# Patient Record
Sex: Female | Born: 1950 | Race: Black or African American | Hispanic: No | Marital: Married | Smoking: Current every day smoker
Health system: Southern US, Community
[De-identification: ages and names within clinical notes are randomized; demographics above are authoritative.]

## PROBLEM LIST (undated history)

## (undated) DIAGNOSIS — M1990 Unspecified osteoarthritis, unspecified site: Secondary | ICD-10-CM

## (undated) DIAGNOSIS — G4730 Sleep apnea, unspecified: Secondary | ICD-10-CM

## (undated) DIAGNOSIS — J329 Chronic sinusitis, unspecified: Secondary | ICD-10-CM

## (undated) DIAGNOSIS — E785 Hyperlipidemia, unspecified: Secondary | ICD-10-CM

## (undated) DIAGNOSIS — I10 Essential (primary) hypertension: Secondary | ICD-10-CM

## (undated) HISTORY — DX: Unspecified osteoarthritis, unspecified site: M19.90

## (undated) HISTORY — PX: HEMORRHOID SURGERY: SHX153

## (undated) HISTORY — DX: Sleep apnea, unspecified: G47.30

## (undated) HISTORY — DX: Chronic sinusitis, unspecified: J32.9

## (undated) HISTORY — DX: Essential (primary) hypertension: I10

## (undated) HISTORY — PX: COLONOSCOPY: SHX174

## (undated) HISTORY — DX: Hyperlipidemia, unspecified: E78.5

---

## 2004-02-07 ENCOUNTER — Ambulatory Visit: Payer: Self-pay

## 2007-03-17 ENCOUNTER — Ambulatory Visit: Payer: Self-pay

## 2008-04-20 ENCOUNTER — Ambulatory Visit: Payer: Self-pay | Admitting: Internal Medicine

## 2008-08-08 ENCOUNTER — Ambulatory Visit: Payer: Self-pay | Admitting: Internal Medicine

## 2009-03-20 ENCOUNTER — Ambulatory Visit: Payer: Self-pay | Admitting: Internal Medicine

## 2009-04-26 ENCOUNTER — Ambulatory Visit: Payer: Self-pay | Admitting: Internal Medicine

## 2009-10-12 ENCOUNTER — Ambulatory Visit: Payer: Self-pay | Admitting: Gastroenterology

## 2010-05-17 ENCOUNTER — Ambulatory Visit: Payer: Self-pay

## 2011-05-20 ENCOUNTER — Ambulatory Visit: Payer: Self-pay

## 2012-05-20 ENCOUNTER — Ambulatory Visit: Payer: Self-pay

## 2013-05-25 ENCOUNTER — Ambulatory Visit: Payer: Self-pay

## 2015-01-31 ENCOUNTER — Encounter: Payer: Self-pay | Admitting: *Deleted

## 2015-02-08 ENCOUNTER — Ambulatory Visit (INDEPENDENT_AMBULATORY_CARE_PROVIDER_SITE_OTHER): Payer: Managed Care, Other (non HMO) | Admitting: General Surgery

## 2015-02-08 ENCOUNTER — Encounter: Payer: Self-pay | Admitting: General Surgery

## 2015-02-08 VITALS — Ht 63.0 in | Wt 244.0 lb

## 2015-02-08 DIAGNOSIS — K603 Anal fistula: Secondary | ICD-10-CM

## 2015-02-08 MED ORDER — DOXYCYCLINE HYCLATE 100 MG PO CAPS: 100.0000 mg | ORAL_CAPSULE | Freq: Two times a day (BID) | ORAL | Status: DC

## 2015-02-08 NOTE — Progress Notes (Signed)
Patient ID: Tiffany Schaefer, female   DOB: 17-Jul-1950, 64 y.o.   MRN: [id number]  Chief Complaint  Patient presents with  . Rectal Pain    hemorrhoids    HPI Tiffany Schaefer is a 64 y.o. female.  Here today for evaluation of hemorrhoids. She states she had trouble with hemorrhoids 30 years ago. This episode started bothering her about 1 week ago. She was having pain and bleeding with BM. She feels like the hemorrhoids are swollen, she tried lidocaine/hc cream which didn't help. Bowels generally move every day.   HPI  Past Medical History  Diagnosis Date  . Hyperlipidemia   . Arthritis   . Sinus infection     Past Surgical History  Procedure Laterality Date  . Colonoscopy    . Hemorrhoid surgery      No family history on file.  Social History Social History  Substance Use Topics  . Smoking status: Current Every Day Smoker -- 1.00 packs/day for 30 years    Types: Cigarettes  . Smokeless tobacco: Never Used  . Alcohol Use: 0.0 oz/week    0 Standard drinks or equivalent per week     Comment: occasionally    No Known Allergies  Current Outpatient Prescriptions  Medication Sig Dispense Refill  . aspirin EC 81 MG tablet Take 81 mg by mouth daily.    . carvedilol (COREG) 25 MG tablet Take 25 mg by mouth 2 (two) times daily with a meal.     . furosemide (LASIX) 20 MG tablet Take 20 mg by mouth daily.     . Lidocaine-Hydrocortisone Ace 3-0.5 % KIT as needed.     Marland Kitchen losartan-hydrochlorothiazide (HYZAAR) 100-25 MG tablet Take 1 tablet by mouth daily.     Marland Kitchen lovastatin (MEVACOR) 40 MG tablet Take 40 mg by mouth at bedtime.    . potassium chloride SA (K-DUR,KLOR-CON) 20 MEQ tablet Take 20 mEq by mouth daily.     . Vitamin D, Ergocalciferol, (DRISDOL) 50000 UNITS CAPS capsule Take 50,000 Units by mouth every 7 (seven) days.    Marland Kitchen doxycycline (VIBRAMYCIN) 100 MG capsule Take 1 capsule (100 mg total) by mouth 2 (two) times daily. 14 capsule 0   No current facility-administered medications for  this visit.    Review of Systems Review of Systems  Constitutional: Negative.   Respiratory: Negative.   Cardiovascular: Negative.   Gastrointestinal: Positive for constipation, anal bleeding and rectal pain. Negative for nausea and diarrhea.    Height _0  (1.6 m), weight 244 lb (110.678 kg).  Physical Exam Physical Exam  Constitutional: She is oriented to person, place, and time. She appears well-developed and well-nourished.  HENT:  Mouth/Throat: Oropharynx is clear and moist.  Eyes: Conjunctivae are normal. No scleral icterus.  Neck: Neck supple.  Cardiovascular: Normal rate, regular rhythm and normal heart sounds.   Pulmonary/Chest: Effort normal and breath sounds normal.  Abdominal: Soft.  Genitourinary:  Small external opening 1 cm from anal orifice at 3-4 o'clock position, small amount of pus is seen gental probing did not suggest a long track.  Lymphadenopathy:    She has no cervical adenopathy.  Neurological: She is alert and oriented to person, place, and time.  Skin: Skin is warm and dry.  Psychiatric: Her behavior is normal.    Data Reviewed Progress notes.  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    Plan    Trial of doxycycline for one week and reassess.    PCP:  Clayborn Bigness  Ref: Hilario Quarry 02/09/2015, 9:46 AM

## 2015-02-08 NOTE — Patient Instructions (Signed)
The patient is aware to call back for any questions or concerns.  

## 2015-02-23 ENCOUNTER — Encounter: Payer: Self-pay | Admitting: General Surgery

## 2015-02-23 ENCOUNTER — Ambulatory Visit (INDEPENDENT_AMBULATORY_CARE_PROVIDER_SITE_OTHER): Payer: Managed Care, Other (non HMO) | Admitting: General Surgery

## 2015-02-23 VITALS — BP 148/88 | HR 84 | Resp 12 | Ht 63.0 in | Wt 252.0 lb

## 2015-02-23 DIAGNOSIS — K603 Anal fistula: Secondary | ICD-10-CM | POA: Diagnosis not present

## 2015-02-23 NOTE — Patient Instructions (Addendum)
The patient is aware to call back for any questions or concerns. Follow up as needed. Call if symptoms return or she has questions.

## 2015-02-23 NOTE — Progress Notes (Signed)
Here for a follow up anal fistula vs abscess that had drained. She was on doxycycline for one week. She states the area is much better. No drainage or bleeding.  Previous opening has closed and no drainage noted. No swelling, palpable ridge or tenderness  Follow up as needed. Call if symptoms return or she has questions.   PCP:  Clayborn Bigness

## 2017-04-16 ENCOUNTER — Other Ambulatory Visit: Payer: Self-pay | Admitting: Internal Medicine

## 2017-04-21 ENCOUNTER — Ambulatory Visit: Payer: 59 | Admitting: Nurse Practitioner

## 2017-04-21 ENCOUNTER — Encounter: Payer: Self-pay | Admitting: Nurse Practitioner

## 2017-04-21 VITALS — BP 130/80 | HR 100 | Resp 16 | Ht 63.0 in | Wt 244.4 lb

## 2017-04-21 DIAGNOSIS — N950 Postmenopausal bleeding: Secondary | ICD-10-CM | POA: Diagnosis not present

## 2017-04-21 DIAGNOSIS — I499 Cardiac arrhythmia, unspecified: Secondary | ICD-10-CM | POA: Insufficient documentation

## 2017-04-21 DIAGNOSIS — J209 Acute bronchitis, unspecified: Secondary | ICD-10-CM

## 2017-04-21 DIAGNOSIS — I10 Essential (primary) hypertension: Secondary | ICD-10-CM | POA: Insufficient documentation

## 2017-04-21 DIAGNOSIS — E785 Hyperlipidemia, unspecified: Secondary | ICD-10-CM

## 2017-04-21 DIAGNOSIS — E782 Mixed hyperlipidemia: Secondary | ICD-10-CM | POA: Insufficient documentation

## 2017-04-21 DIAGNOSIS — R609 Edema, unspecified: Secondary | ICD-10-CM | POA: Insufficient documentation

## 2017-04-21 MED ORDER — AZITHROMYCIN 250 MG PO TABS: ORAL_TABLET | ORAL | 0 refills | Status: DC

## 2017-04-21 NOTE — Progress Notes (Signed)
Subjective:     Patient ID: Tiffany Schaefer, female   DOB: 11-Jan-1951, 66 y.o.   MRN: [id number]   Patient c/o cough since October. Denies any excess wheezing or shortness of breath.  Denies headache or fever. Feels ok other than the coughing. Referred to GYN at her last visit due to presence of multiple uterine fibroids and vaginal bleeding. Patient has not heard from our office or GYN regarding initial assessment.      Review of Systems  Constitutional: Positive for fatigue.  HENT: Positive for congestion and voice change.   Eyes: Negative.   Respiratory: Positive for cough. Negative for wheezing.   Gastrointestinal: Negative.   Endocrine: Negative.   Genitourinary: Negative.   Musculoskeletal: Negative.   Allergic/Immunologic: Negative.   Neurological: Negative.   Hematological: Negative.   Psychiatric/Behavioral: Negative.        Objective:   Physical Exam  Constitutional: She appears well-developed and well-nourished.  HENT:  Head: Normocephalic and atraumatic.  Right Ear: Tympanic membrane is erythematous and bulging.  Left Ear: Tympanic membrane is erythematous and bulging.  Nose: Nose normal.  Mouth/Throat: Mucous membranes are normal. Posterior oropharyngeal erythema present. No oropharyngeal exudate.       Assessment:     Acute bronchitis, unspecified organism - Plan: azithromycin (ZITHROMAX) 250 MG tablet  Hypertension, unspecified type  Post-menopausal bleeding - Plan: Ambulatory referral to Gynecology  Hyperlipidemia, unspecified hyperlipidemia type     Plan:     1. Acute bronchitis - z-pack - take as directed for 5 days. Continue to take OTC cough medicine to relieve symptoms.  2. Hen - stable. Continue bp medication as prescribed  3. Post menopausal bleeding - r/t numerous fibroid tumors. New referral to GYN done today  Wellness visit in 6 months Follow up as needed and if worse

## 2017-04-24 ENCOUNTER — Other Ambulatory Visit: Payer: Self-pay

## 2017-04-24 MED ORDER — VITAMIN D (ERGOCALCIFEROL) 1.25 MG (50000 UNIT) PO CAPS: 50000.0000 [IU] | ORAL_CAPSULE | ORAL | 3 refills | Status: DC

## 2017-05-20 ENCOUNTER — Encounter: Payer: Self-pay | Admitting: Emergency Medicine

## 2017-05-20 ENCOUNTER — Emergency Department: Payer: 59

## 2017-05-20 ENCOUNTER — Emergency Department
Admission: EM | Admit: 2017-05-20 | Discharge: 2017-05-20 | Disposition: A | Discharge status: Home / Self Care | Payer: 59 | Attending: Emergency Medicine | Admitting: Emergency Medicine

## 2017-05-20 DIAGNOSIS — R55 Syncope and collapse: Secondary | ICD-10-CM | POA: Diagnosis present

## 2017-05-20 DIAGNOSIS — F17210 Nicotine dependence, cigarettes, uncomplicated: Secondary | ICD-10-CM | POA: Diagnosis not present

## 2017-05-20 DIAGNOSIS — I10 Essential (primary) hypertension: Secondary | ICD-10-CM | POA: Insufficient documentation

## 2017-05-20 DIAGNOSIS — J40 Bronchitis, not specified as acute or chronic: Secondary | ICD-10-CM

## 2017-05-20 DIAGNOSIS — J209 Acute bronchitis, unspecified: Secondary | ICD-10-CM | POA: Insufficient documentation

## 2017-05-20 DIAGNOSIS — Z7982 Long term (current) use of aspirin: Secondary | ICD-10-CM | POA: Diagnosis not present

## 2017-05-20 DIAGNOSIS — E876 Hypokalemia: Secondary | ICD-10-CM | POA: Insufficient documentation

## 2017-05-20 DIAGNOSIS — Z79899 Other long term (current) drug therapy: Secondary | ICD-10-CM | POA: Insufficient documentation

## 2017-05-20 DIAGNOSIS — E785 Hyperlipidemia, unspecified: Secondary | ICD-10-CM | POA: Diagnosis not present

## 2017-05-20 LAB — URINALYSIS, COMPLETE (UACMP) WITH MICROSCOPIC
Bacteria, UA: NONE SEEN
Bilirubin Urine: NEGATIVE
GLUCOSE, UA: NEGATIVE mg/dL
HGB URINE DIPSTICK: NEGATIVE
Ketones, ur: NEGATIVE mg/dL
Leukocytes, UA: NEGATIVE
NITRITE: NEGATIVE
PH: 6 (ref 5.0–8.0)
Protein, ur: NEGATIVE mg/dL
SPECIFIC GRAVITY, URINE: 1.019 (ref 1.005–1.030)

## 2017-05-20 LAB — BASIC METABOLIC PANEL
ANION GAP: 12 (ref 5–15)
BUN: 22 mg/dL — AB (ref 6–20)
CALCIUM: 8.5 mg/dL — AB (ref 8.9–10.3)
CO2: 24 mmol/L (ref 22–32)
CREATININE: 1.02 mg/dL — AB (ref 0.44–1.00)
Chloride: 103 mmol/L (ref 101–111)
GFR calc Af Amer: >60 mL/min (ref >60)
GFR, EST NON AFRICAN AMERICAN: 57 mL/min — AB (ref >60)
GLUCOSE: 138 mg/dL — AB (ref 65–99)
Potassium: 2.9 mmol/L — ABNORMAL LOW (ref 3.5–5.1)
Sodium: 139 mmol/L (ref 135–145)

## 2017-05-20 LAB — CBC
HCT: 38.2 % (ref 35.0–47.0)
Hemoglobin: 12.9 g/dL (ref 12.0–16.0)
MCH: 28.9 pg (ref 26.0–34.0)
MCHC: 33.8 g/dL (ref 32.0–36.0)
MCV: 85.4 fL (ref 80.0–100.0)
PLATELETS: 217 10*3/uL (ref 150–440)
RBC: 4.48 MIL/uL (ref 3.80–5.20)
RDW: 16.4 % — AB (ref 11.5–14.5)
WBC: 7.9 10*3/uL (ref 3.6–11.0)

## 2017-05-20 LAB — TROPONIN I: Troponin I: <0.03 ng/mL (ref <0.03)

## 2017-05-20 MED ORDER — IPRATROPIUM-ALBUTEROL 0.5-2.5 (3) MG/3ML IN SOLN
3.0000 mL | Freq: Once | RESPIRATORY_TRACT | Status: AC
  Administered 2017-05-20: 3 mL via RESPIRATORY_TRACT
  Filled 2017-05-20: qty 3

## 2017-05-20 MED ORDER — POTASSIUM CHLORIDE CRYS ER 20 MEQ PO TBCR
40.0000 meq | EXTENDED_RELEASE_TABLET | Freq: Once | ORAL | Status: AC
  Administered 2017-05-20: 40 meq via ORAL
  Filled 2017-05-20: qty 2

## 2017-05-20 MED ORDER — ALBUTEROL SULFATE HFA 108 (90 BASE) MCG/ACT IN AERS: 2.0000 | INHALATION_SPRAY | Freq: Four times a day (QID) | RESPIRATORY_TRACT | 0 refills | Status: DC | PRN

## 2017-05-20 MED ORDER — SODIUM CHLORIDE 0.9 % IV BOLUS (SEPSIS)
1000.0000 mL | Freq: Once | INTRAVENOUS | Status: AC
  Administered 2017-05-20: 1000 mL via INTRAVENOUS

## 2017-05-20 NOTE — ED Triage Notes (Addendum)
Pt to ED via EMS from work where pt had a witnessed syncopal episode. Pt reports dizziness prior to syncope. Pt A&Ox4, VS stable. Per EMS cbg 161 . Speech clear

## 2017-05-20 NOTE — ED Provider Notes (Signed)
[facility] Emergency Department Provider Note ____________________________________________   I have reviewed the triage vital signs and the triage nursing note.  HISTORY  Chief Complaint Near Syncope   Historian Patient  HPI Tiffany Schaefer is a 67 y.o. female with obesity, sounds like prior bronchospasm, hypertension, hyperlipidemia, presents to the ER after syncope.  Patient states that she was feeling a little lightheaded and was sitting down at the time and then passed out.  Her coworkers told her she passed out.  Does not sound like she was out that long.  No injuries.  No prior chest pain or trouble breathing or palpitations, no current chest pain or palpitations.  She has had a mild congestion without significant coughing.  No nausea vomiting or diarrhea.  No urinary symptoms.   Past Medical History:  Diagnosis Date  . Arthritis   . Hyperlipidemia   . Hypertension   . Sinus infection     Patient Active Problem List   Diagnosis Date Noted  . Post-menopausal bleeding 04/21/2017  . Hypertension 04/21/2017  . Hyperlipidemia 04/21/2017  . Cardiac arrhythmia 04/21/2017  . Edema 04/21/2017    Past Surgical History:  Procedure Laterality Date  . CESAREAN SECTION    . COLONOSCOPY    . HEMORRHOID SURGERY      Prior to Admission medications   Medication Sig Start Date End Date Taking? Authorizing Provider  albuterol (PROVENTIL HFA;VENTOLIN HFA) 108 (90 Base) MCG/ACT inhaler Inhale 2 puffs into the lungs every 6 (six) hours as needed for wheezing or shortness of breath. 05/20/17   Lisa Roca, MD  aspirin EC 81 MG tablet Take 81 mg by mouth daily.    [provider]  azithromycin (ZITHROMAX) 250 MG tablet Take by mouth as directed for 5 days - Z-pack 04/21/17   Ronnell Freshwater, NP  carvedilol (COREG) 25 MG tablet Take 25 mg by mouth 2 (two) times daily with a meal.  01/11/15   [provider]  furosemide (LASIX) 20 MG tablet  Take 20 mg by mouth daily.  01/07/15   [provider]  Lidocaine-Hydrocortisone Ace 3-0.5 % KIT as needed.  02/01/15   [provider]  losartan-hydrochlorothiazide (HYZAAR) 100-25 MG tablet Take 1 tablet by mouth daily.  01/11/15   [provider]  lovastatin (MEVACOR) 40 MG tablet TAKE ONE TABLET BY MOUTH AT BEDTIME 04/17/17   Boscia, Nira Conn E, NP  potassium chloride SA (K-DUR,KLOR-CON) 20 MEQ tablet Take 20 mEq by mouth daily.  12/06/14   [provider]  Vitamin D, Ergocalciferol, (DRISDOL) 50000 units CAPS capsule Take 1 capsule (50,000 Units total) by mouth every 7 (seven) days. 04/24/17   Ronnell Freshwater, NP    No Known Allergies  Family History  Problem Relation Age of Onset  . Stroke Mother   . Diabetes Brother   . Rheumatic fever Brother   . Arthritis/Rheumatoid Brother     Social History Social History   Tobacco Use  . Smoking status: Current Every Day Smoker    Packs/day: 1.00    Years: 30.00    Pack years: 30.00    Types: Cigarettes  . Smokeless tobacco: Never Used  Substance Use Topics  . Alcohol use: Yes    Alcohol/week: 0.0 oz    Comment: occasionally  . Drug use: No    Review of Systems  Constitutional: Negative for fever. Eyes: Negative for visual changes. ENT: Negative for sore throat. Cardiovascular: Negative for chest pain. Respiratory: Negative for shortness  of breath. Gastrointestinal: Negative for abdominal pain, vomiting and diarrhea. Genitourinary: Negative for dysuria. Musculoskeletal: Negative for back pain. Skin: Negative for rash. Neurological: Negative for headache.  ____________________________________________   PHYSICAL EXAM:  VITAL SIGNS: ED Triage Vitals [05/20/17 1509]  Enc Vitals Group     BP 101/62     Pulse Rate 74     Resp 16     Temp (!) 97.5 F (36.4 C)     Temp Source Oral     SpO2 97 %     Weight      Height      Head Circumference      Peak Flow      Pain Score 0     Pain  Loc      Pain Edu?      Excl. in [place]?      Constitutional: Alert and oriented. Well appearing and in no distress. HEENT   Head: Normocephalic and atraumatic.      Eyes: Conjunctivae are normal. Pupils equal and round.       Ears:         Nose: No congestion/rhinnorhea.   Mouth/Throat: Mucous membranes are mildly dry.   Neck: No stridor. Cardiovascular/Chest: Normal rate, regular rhythm.  No murmurs, rubs, or gallops. Respiratory: Normal respiratory effort without tachypnea nor retractions. Breath sounds are clear and equal bilaterally. No wheezes/rales/rhonchi. Gastrointestinal: Soft. No distention, no guarding, no rebound. Nontender.  Morbidly obese. Genitourinary/rectal:Deferred Musculoskeletal: Nontender with normal range of motion in all extremities. No joint effusions.  No lower extremity tenderness.  No edema. Neurologic:  Normal speech and language. No gross or focal neurologic deficits are appreciated. Skin:  Skin is warm, dry and intact. No rash noted. Psychiatric: Mood and affect are normal. Speech and behavior are normal. Patient exhibits appropriate insight and judgment.   ____________________________________________  LABS (pertinent positives/negatives) I, Lisa Roca, MD the attending physician have reviewed the labs noted below.  Labs Reviewed  BASIC METABOLIC PANEL - Abnormal; Notable for the following components:      Result Value   Potassium 2.9 (*)    Glucose, Bld 138 (*)    BUN 22 (*)    Creatinine, Ser 1.02 (*)    Calcium 8.5 (*)    GFR calc non Af Amer 57 (*)    All other components within normal limits  CBC - Abnormal; Notable for the following components:   RDW 16.4 (*)    All other components within normal limits  URINALYSIS, COMPLETE (UACMP) WITH MICROSCOPIC - Abnormal; Notable for the following components:   Color, Urine YELLOW (*)    APPearance HAZY (*)    Squamous Epithelial / LPF 0-5 (*)    All other components within normal limits   TROPONIN I  CBG MONITORING, ED    ____________________________________________    EKG I, Lisa Roca, MD, the attending physician have personally viewed and interpreted all ECGs.  77 bpm.  Normal sinus rhythm.  Narrow QRS.  Normal axis.  Mildly wavy underlying baseline, but no ST segment elevation ST segment depression pathologic T wave inversions.  QTC 491.  No evidence for Brugada or Wolff-Parkinson-White. ____________________________________________  RADIOLOGY All Xrays were viewed by me.  Imaging interpreted by Radiologist, and I, Lisa Roca, MD the attending physician have reviewed the radiologist interpretation noted below.  Chest x-ray:  IMPRESSION: No active cardiopulmonary disease. __________________________________________  PROCEDURES  Procedure(s) performed: None  Critical Care performed: None   ____________________________________________  ED COURSE / ASSESSMENT AND PLAN  Pertinent labs & imaging results that were available during my care of the patient were reviewed by me and considered in my medical decision making (see chart for details).   She is overall well-appearing without high risk red flag features around the syncope today.  She does look a little dry clinically.  Ostium level slightly low.  Patient does take Lasix as well as Hydrocort thiazide, she does take 20 mEq of potassium daily.  To give her an additional 40 mEq today.  She is going to double her dose for 2 days.  No neurologic symptoms or high risk cardiac symptoms, I think from a syncope standpoint patient does not necessarily need to be hospitalized and she is comfortable with outpatient follow-up.  She does have some rhonchus spasm which sound like it has been going on now for about a month or so, I am adding on a chest x-ray, and giving her an albuterol treatment here.  I will discharge her with an inhaler as well.    DIFFERENTIAL DIAGNOSIS: Including but not limited to cardiac  arrhythmia, stroke, electrolyte disturbance, anemia, vasovagal, orthostatic hypotension, infection or sepsis, urinary tract infection etc.  CONSULTATIONS: None   Patient / Family / Caregiver informed of clinical course, medical decision-making process, and agree with plan.   I discussed return precautions, follow-up instructions, and discharge instructions with patient and/or family.  Discharge Instructions : You are evaluated after passing out, and although no certain cause was found, your exam and evaluation are overall reassuring in the emergency department today.  We discussed her potassium level is low.  Please take 2 potassium supplements for tomorrow and the next day and then resume your normal dosing.  Please call your primary care doctor's office tomorrow and make an appointment for next available appointment this week.  Return to the emergency department immediately for any passing out episode, weakness, numbness, confusion or altered mental status, palpitations, chest pain, trouble breathing, or any other symptoms concerning to you.    ___________________________________________   FINAL CLINICAL IMPRESSION(S) / ED DIAGNOSES   Final diagnoses:  Hypokalemia  Bronchitis  Syncope, unspecified syncope type      ___________________________________________        Note: This dictation was prepared with Dragon dictation. Any transcriptional errors that result from this process are unintentional    Lisa Roca, MD 05/20/17 1758

## 2017-05-20 NOTE — Discharge Instructions (Signed)
You are evaluated after passing out, and although no certain cause was found, your exam and evaluation are overall reassuring in the emergency department today.  We discussed her potassium level is low.  Please take 2 potassium supplements for tomorrow and the next day and then resume your normal dosing.  Please call your primary care doctor's office tomorrow and make an appointment for next available appointment this week.  Return to the emergency department immediately for any passing out episode, weakness, numbness, confusion or altered mental status, palpitations, chest pain, trouble breathing, or any other symptoms concerning to you.

## 2017-05-20 NOTE — ED Notes (Signed)
Pt ambulatory to bathroom with standby assist, pt denies any dizziness at this time.

## 2017-06-09 ENCOUNTER — Encounter: Payer: Self-pay | Admitting: Internal Medicine

## 2017-06-09 DIAGNOSIS — F17210 Nicotine dependence, cigarettes, uncomplicated: Secondary | ICD-10-CM | POA: Insufficient documentation

## 2017-06-09 DIAGNOSIS — D259 Leiomyoma of uterus, unspecified: Secondary | ICD-10-CM | POA: Insufficient documentation

## 2017-06-10 ENCOUNTER — Encounter: Payer: Self-pay | Admitting: Internal Medicine

## 2017-06-10 ENCOUNTER — Ambulatory Visit: Payer: 59 | Admitting: Internal Medicine

## 2017-06-10 VITALS — BP 161/91 | HR 88 | Resp 16 | Ht 63.0 in | Wt 244.6 lb

## 2017-06-10 DIAGNOSIS — E785 Hyperlipidemia, unspecified: Secondary | ICD-10-CM | POA: Diagnosis not present

## 2017-06-10 DIAGNOSIS — I10 Essential (primary) hypertension: Secondary | ICD-10-CM | POA: Diagnosis not present

## 2017-06-10 DIAGNOSIS — E876 Hypokalemia: Secondary | ICD-10-CM | POA: Diagnosis not present

## 2017-06-10 NOTE — Progress Notes (Signed)
[facility] [place], [place] [address]  Internal MEDICINE  Office Visit Note  Patient Name: Tiffany Schaefer  [id number]  [id number]  Date of Service: 06/10/2017  Chief Complaint  Patient presents with  . Hospitalization Follow-up    ER visit on 05-20-17 passed out at work    HPI  Pt is here for routine follow up.  Pt was found to be hypertensive and her potassium was low at the time. She was given K replacement and f/u was reccommended. She denies any c/p or SOB but does feel week   Current Medication: Outpatient Encounter Medications as of 06/10/2017  Medication Sig Note  . albuterol (PROVENTIL HFA;VENTOLIN HFA) 108 (90 Base) MCG/ACT inhaler Inhale 2 puffs into the lungs every 6 (six) hours as needed for wheezing or shortness of breath.   Marland Kitchen aspirin EC 81 MG tablet Take 81 mg by mouth daily.   . carvedilol (COREG) 25 MG tablet Take 25 mg by mouth 2 (two) times daily with a meal.  02/08/2015: Received from: External Pharmacy  . furosemide (LASIX) 20 MG tablet Take 20 mg by mouth daily.  02/08/2015: Received from: External Pharmacy  . Lidocaine-Hydrocortisone Ace 3-0.5 % KIT as needed.  02/08/2015: Received from: External Pharmacy  . losartan-hydrochlorothiazide (HYZAAR) 100-25 MG tablet Take 1 tablet by mouth daily.  02/08/2015: Received from: External Pharmacy  . lovastatin (MEVACOR) 40 MG tablet TAKE ONE TABLET BY MOUTH AT BEDTIME   . potassium chloride SA (K-DUR,KLOR-CON) 20 MEQ tablet Take 20 mEq by mouth daily.  02/08/2015: Received from: External Pharmacy  . Vitamin D, Ergocalciferol, (DRISDOL) 50000 units CAPS capsule Take 1 capsule (50,000 Units total) by mouth every 7 (seven) days. (Patient not taking: Reported on 06/10/2017)   . [DISCONTINUED] azithromycin (ZITHROMAX) 250 MG tablet Take by mouth as directed for 5 days - Z-pack (Patient not taking: Reported on 06/10/2017)    No facility-administered encounter medications on file as of 06/10/2017.     Surgical  History: Past Surgical History:  Procedure Laterality Date  . CESAREAN SECTION    . COLONOSCOPY    . HEMORRHOID SURGERY      Medical History: Past Medical History:  Diagnosis Date  . Arthritis   . Hyperlipidemia   . Hypertension   . Sinus infection   . Sleep apnea     Family History: Family History  Problem Relation Age of Onset  . Stroke Mother   . Diabetes Brother   . Rheumatic fever Brother   . Arthritis/Rheumatoid Brother     Social History   Socioeconomic History  . Marital status: Married    Spouse name: Not on file  . Number of children: Not on file  . Years of education: Not on file  . Highest education level: Not on file  Social Needs  . Financial resource strain: Not on file  . Food insecurity - worry: Not on file  . Food insecurity - inability: Not on file  . Transportation needs - medical: Not on file  . Transportation needs - non-medical: Not on file  Occupational History  . Not on file  Tobacco Use  . Smoking status: Current Every Day Smoker    Packs/day: 1.00    Years: 30.00    Pack years: 30.00    Types: Cigarettes  . Smokeless tobacco: Never Used  Substance and Sexual Activity  . Alcohol use: Yes    Alcohol/week: 0.0 oz    Comment: occasionally  . Drug use: No  .  Sexual activity: Not on file  Other Topics Concern  . Not on file  Social History Narrative  . Not on file    Review of Systems  Constitutional: Positive for fatigue. Negative for chills and unexpected weight change.  HENT: Negative for congestion, rhinorrhea, sneezing and sore throat.   Eyes: Negative for redness.  Respiratory: Negative for cough, chest tightness and shortness of breath.   Cardiovascular: Negative for chest pain and palpitations.  Gastrointestinal: Negative for abdominal pain, constipation, diarrhea, nausea and vomiting.  Genitourinary: Negative for dysuria and frequency.  Musculoskeletal: Negative for arthralgias, back pain, joint swelling and neck  pain.  Skin: Negative for rash.  Neurological: Negative.  Negative for tremors and numbness.  Hematological: Negative for adenopathy. Does not bruise/bleed easily.  Psychiatric/Behavioral: Negative for behavioral problems (Depression), sleep disturbance and suicidal ideas. The patient is not nervous/anxious.     Vital Signs: BP (!) 161/91 (BP Location: Left Arm, Patient Position: Sitting)   Pulse 88   Resp 16   Ht _0  (1.6 m)   Wt 244 lb 9.6 oz (110.9 kg)   SpO2 99%   BMI 43.33 kg/m    Physical Exam  Constitutional: She is oriented to person, place, and time. She appears well-developed and well-nourished. No distress.  HENT:  Head: Normocephalic and atraumatic.  Mouth/Throat: Oropharynx is clear and moist. No oropharyngeal exudate.  Eyes: EOM are normal. Pupils are equal, round, and reactive to light.  Neck: Normal range of motion. Neck supple. No thyromegaly present.  Cardiovascular: Normal rate, regular rhythm and normal heart sounds. Exam reveals no gallop and no friction rub.  No murmur heard. Pulmonary/Chest: Effort normal and breath sounds normal. No respiratory distress.  Abdominal: Soft. Bowel sounds are normal.  Musculoskeletal: Normal range of motion.  Neurological: She is alert and oriented to person, place, and time. No cranial nerve deficit.  Skin: Skin is warm and dry. She is not diaphoretic.  Psychiatric: She has a normal mood and affect. Her behavior is normal. Judgment and thought content normal.    Assessment/Plan: 1. Hypertension, unspecified type - Uncontrolled BP, was instructed to take all medications as prescribed, might need micro-albumin level - Basic Metabolic Panel (BMET)  2. Hypokalemia - Continue KCL as before - Basic Metabolic Panel (BMET)  3. Hyperlipidemia, unspecified hyperlipidemia type - Diet controlled,   General Counseling: [name] verbalizes understanding of the findings of todays visit and agrees with plan of treatment. I have  discussed any further diagnostic evaluation that may be needed or ordered today. We also reviewed her medications today. she has been encouraged to call the office with any questions or concerns that should arise related to todays visit.  Hypertension Counseling:   The following hypertensive lifestyle modification were recommended and discussed:  1. Limiting alcohol intake to less than 1 oz/day of ethanol:(24 oz of beer or 8 oz of wine or 2 oz of 100-proof whiskey). 2. Take baby ASA 81 mg daily. 3. Importance of regular aerobic exercise and losing weight. 4. Reduce dietary saturated fat and cholesterol intake for overall cardiovascular health. 5. Maintaining adequate dietary potassium, calcium, and magnesium intake. 6. Regular monitoring of the blood pressure. 7. Reduce sodium intake to less than 100 mmol/day (less than 2.3 gm of sodium or less than 6 gm of sodium choride)    Time spent:30 Minutes  Dr Lavera Guise Internal medicine

## 2017-06-11 LAB — BASIC METABOLIC PANEL
BUN/Creatinine Ratio: 19 (ref 12–28)
BUN: 16 mg/dL (ref 8–27)
CALCIUM: 8.7 mg/dL (ref 8.7–10.3)
CHLORIDE: 104 mmol/L (ref 96–106)
CO2: 24 mmol/L (ref 20–29)
CREATININE: 0.84 mg/dL (ref 0.57–1.00)
GFR, EST AFRICAN AMERICAN: 85 mL/min/{1.73_m2} (ref >59)
GFR, EST NON AFRICAN AMERICAN: 74 mL/min/{1.73_m2} (ref >59)
Glucose: 80 mg/dL (ref 65–99)
Potassium: 3.8 mmol/L (ref 3.5–5.2)
Sodium: 146 mmol/L — ABNORMAL HIGH (ref 134–144)

## 2017-07-21 ENCOUNTER — Other Ambulatory Visit: Payer: Self-pay | Admitting: Internal Medicine

## 2017-07-21 ENCOUNTER — Other Ambulatory Visit: Payer: Self-pay

## 2017-07-21 MED ORDER — POTASSIUM CHLORIDE CRYS ER 20 MEQ PO TBCR: 20.0000 meq | EXTENDED_RELEASE_TABLET | Freq: Every day | ORAL | 3 refills | Status: DC

## 2017-08-11 ENCOUNTER — Encounter: Payer: Self-pay | Admitting: Obstetrics and Gynecology

## 2017-08-11 ENCOUNTER — Ambulatory Visit (INDEPENDENT_AMBULATORY_CARE_PROVIDER_SITE_OTHER): Payer: 59 | Admitting: Obstetrics and Gynecology

## 2017-08-11 VITALS — BP 132/80 | HR 80 | Ht 63.0 in | Wt 247.0 lb

## 2017-08-11 DIAGNOSIS — N950 Postmenopausal bleeding: Secondary | ICD-10-CM | POA: Diagnosis not present

## 2017-08-11 DIAGNOSIS — Z113 Encounter for screening for infections with a predominantly sexual mode of transmission: Secondary | ICD-10-CM | POA: Diagnosis not present

## 2017-08-11 DIAGNOSIS — Z124 Encounter for screening for malignant neoplasm of cervix: Secondary | ICD-10-CM

## 2017-08-11 NOTE — Progress Notes (Signed)
Gynecology H&P  Chief Complaint:  Chief Complaint  Patient presents with  . Postmenopausal bleeding    periodic bleeding, seen by [place] [facility]    History of Present Illness: Patient is a 67 y.o. G2P0 presents evaluation of postmenopausal bleeding. The patient states she has had a multiple episode(s) of bleeding in the past 1 year(s). The bleeding has been limited to spotting and intermittent but consisted of multiple episodes . She describes the blood as dark brown in appearance.  She has had no additional complaints. She deniestrauma or other inciting event, bloating, cramping, early satiety and abdominal pain. She does not have a history of abnormal pap smears.  The patient denies postcoital bleeding There are no other aggravating factors reported. There are no alleviating factors reported. The patient's past medical history is noncontributory.   She is not currently on any HRT.  She hashad prior work up for postmenopausal bleeding.  Reportedly normal ultrasound evaluation 1 year ago.  Review of Systems: 10 point review of systems negative unless otherwise noted in HPI  Past Medical History:  Past Medical History:  Diagnosis Date  . Arthritis   . Hyperlipidemia   . Hypertension   . Sinus infection   . Sleep apnea     Past Surgical History:  Past Surgical History:  Procedure Laterality Date  . CESAREAN SECTION    . COLONOSCOPY    . HEMORRHOID SURGERY      Family History:  Family History  Problem Relation Age of Onset  . Stroke Mother   . Diabetes Brother   . Rheumatic fever Brother   . Arthritis/Rheumatoid Brother     Social History:  Social History   Socioeconomic History  . Marital status: Married    Spouse name: Not on file  . Number of children: Not on file  . Years of education: Not on file  . Highest education level: Not on file  Occupational History  . Not on file  Social Needs  . Financial resource strain: Not on file  . Food insecurity:    Worry:  Not on file    Inability: Not on file  . Transportation needs:    Medical: Not on file    Non-medical: Not on file  Tobacco Use  . Smoking status: Current Every Day Smoker    Packs/day: 1.00    Years: 30.00    Pack years: 30.00    Types: Cigarettes  . Smokeless tobacco: Never Used  Substance and Sexual Activity  . Alcohol use: Yes    Alcohol/week: 0.0 oz    Comment: occasionally  . Drug use: No  . Sexual activity: Not Currently    Birth control/protection: None  Lifestyle  . Physical activity:    Days per week: Not on file    Minutes per session: Not on file  . Stress: Not on file  Relationships  . Social connections:    Talks on phone: Not on file    Gets together: Not on file    Attends religious service: Not on file    Active member of club or organization: Not on file    Attends meetings of clubs or organizations: Not on file    Relationship status: Not on file  . Intimate partner violence:    Fear of current or ex partner: Not on file    Emotionally abused: Not on file    Physically abused: Not on file    Forced sexual activity: Not on file  Other Topics  Concern  . Not on file  Social History Narrative  . Not on file    Allergies:  No Known Allergies  Medications: Prior to Admission medications   Medication Sig Start Date End Date Taking? Authorizing Provider  albuterol (PROVENTIL HFA;VENTOLIN HFA) 108 (90 Base) MCG/ACT inhaler Inhale 2 puffs into the lungs every 6 (six) hours as needed for wheezing or shortness of breath. 05/20/17  Yes Lisa Roca, MD  aspirin EC 81 MG tablet Take 81 mg by mouth daily.   Yes [provider]  carvedilol (COREG) 25 MG tablet Take 25 mg by mouth 2 (two) times daily with a meal.  01/11/15  Yes [provider]  furosemide (LASIX) 20 MG tablet Take 20 mg by mouth daily.  01/07/15  Yes [provider]  losartan-hydrochlorothiazide (HYZAAR) 100-25 MG tablet Take 1 tablet by mouth daily.  01/11/15  Yes [provider]  lovastatin (MEVACOR) 40 MG tablet TAKE ONE TABLET BY MOUTH AT BEDTIME 04/17/17  Yes Boscia, Heather E, NP  potassium chloride SA (K-DUR,KLOR-CON) 20 MEQ tablet Take 1 tablet (20 mEq total) by mouth daily. 07/21/17  Yes Ronnell Freshwater, NP    Physical Exam Vitals: Blood pressure 132/80, pulse 80, height 5\' 3"  (1.6 m), weight 247 lb (112 kg).  General: NAD HEENT: normocephalic, anicteric Pulmonary: No increased work of breathing Genitourinary:  External: Normal external female genitalia.  Normal urethral meatus, normal Bartholin's and Skene's glands.    Vagina: Normal vaginal mucosa, no evidence of prolapse.    Cervix: Grossly normal in appearance, no bleeding  Uterus: Non-enlarged, mobile, normal contour.  No CMT  Adnexa: ovaries non-enlarged, no adnexal masses  Rectal: deferred Extremities: no edema, erythema, or tenderness Neurologic: Grossly intact Psychiatric: mood appropriate, affect full   ENDOMETRIAL BIOPSY     The indications for endometrial biopsy were reviewed.   Risks of the biopsy including cramping, bleeding, infection, uterine perforation, inadequate specimen and need for additional procedures  were discussed. The patient states she understands and agrees to undergo procedure today. Consent was signed. Time out was performed. Urine HCG was negative. A Graves speculum was placed and the cervix was brought into view.  The cervix was prepped with Betadine. A single-toothed tenaculum was placed on the anterior lip of the cervix for traction. A 3 mm pipelle was introduced through the cervix into the endometrial cavity without difficulty to a depth of 7cm, and a small amount of tissue was obtained, the resulting specime sent to pathology. The instruments were removed from the patient's vagina. Minimal bleeding from the cervix was noted. The patient tolerated the procedure well. Routine post-procedure instructions were given to the patient.  She will be contacted by  phone one results become available.     Assessment: 67 y.o. G2P0 presenting for evaluation of postmenopausal bleeding  Plan: Problem List Items Addressed This Visit    None    Visit Diagnoses    Cervical cancer screening    -  Primary   Relevant Orders   PapIG, CtNgTv, HPV, rfx 16/18   Postmenopausal bleeding       Relevant Orders   Pathology Report   US PELVIS TRANSVANGINAL NON-OB (TV ONLY)   Routine screening for STI (sexually transmitted infection)       Relevant Orders   PapIG, CtNgTv, HPV, rfx 16/18      1) We discussed that menopause is a clinical diagnosis made after 12 months of amenorrhea.  The average age of menopause in the  [place] population is 33 but there may be significant variation.  Any bleeding that happens after a 12 month period of amenorrhea warrants further work.  Possible etiologies of postmenopausal bleeding were discussed with the patient today.  These may range from benign etiologies such as urethral prolapse and atrophy, to indeterminate lesions such as submucosal fibroids or polyps which would require resection to accurately evaluate. The role of unopposed estrogen in the development of  dndometrial hyperplasia or carcinoma is discussed.  The risk of endometrial hyperplasia is linearly correlated with increasing BMI given the production of estrone by adipose tissue.  Work up will be include transvaginal ultrasound to assess the thickness of the endometrial lining as well as to assess for focal uterine lesions.  Negative ultrasound evaluation, defined as the absence of focal lesions and endometrial stripe of <27mm, effectively rules out carcinoma and confirms atrophy as the most likely etiology.  Should focal lesions be present these generally require hysteroscopic resection.  Should lining be greater >28mm endometrial biopsy is warranted to rule out hyperplasia or frank endometrial cancer.  Continued episodes of bleeding despite negative ultrasound also warrant  endometrial sampling.  As the cervical pathology may also be implicated in postmenopausal bleeding prior cervical cytology unavailable, repeat pap smear today.  2) Evaluation by pelvc ultrasound scheduled, with follow up after [place]. EMB obtained today  3) Return in about 1 week (around 08/18/2017) for GYN [place] postmenopausal bleeding.   Malachy Mood, MD, [facility], [facility] 08/12/2017, 9:44 AM

## 2017-08-13 LAB — PATHOLOGY

## 2017-08-14 LAB — PAPIG, CTNGTV, HPV, RFX 16/18
CHLAMYDIA, NUC. ACID AMP: NEGATIVE
Gonococcus, Nuc. Acid Amp: NEGATIVE
HPV, high-risk: NEGATIVE
PAP SMEAR COMMENT: 0
Trich vag by NAA: NEGATIVE

## 2017-08-18 ENCOUNTER — Telehealth: Payer: Self-pay | Admitting: Obstetrics and Gynecology

## 2017-08-18 NOTE — Telephone Encounter (Signed)
-----   Message from Malachy Mood, MD sent at 08/16/2017 10:58 AM EDT ----- Regarding: Surgery Surgery Date:   LOS: same day surgery  Surgery Booking Request Patient Full Name: Tiffany Schaefer MRN: [id number]  DOB: 1950-09-23  Surgeon: Malachy Mood, MD  Requested Surgery Date and Time: 3-4 weeks Primary Diagnosis and Code: Endometrial polyp Secondary Diagnosis and Code: Postmenopausal bleeding Surgical Procedure: Hysteroscopy, D&C L&D Notification:N/A Admission Status: same day surgery Length of Surgery: 1h Special Case Needs: none H&P: can be done at time of follow up ultrasound (date) Phone Interview or Office Pre-Admit: pre-admit Interpreter: No Language: English Medical Clearance: No Special Scheduling Instructions: we can do her H&P at the time of her ultrasound follow up

## 2017-08-18 NOTE — Telephone Encounter (Signed)
Pt returning call. She doesn't have access to her phone until after 3:30. Pt requesting a cb.

## 2017-08-18 NOTE — Telephone Encounter (Signed)
Spoke w/pt. Notified Referral Coordinator had tried to contact her, but is not in the office this pm. Message sent to her to contact pt tomorrow after 3:30

## 2017-08-18 NOTE — Telephone Encounter (Signed)
Lmtrc

## 2017-08-19 NOTE — Telephone Encounter (Signed)
Patient is aware of H&P at [facility] on 5/1//19 after ultrasound, Pre-admit Testing afterwards, and OR on 09/11/17. Ext given.

## 2017-08-20 ENCOUNTER — Other Ambulatory Visit: Payer: Self-pay | Admitting: Internal Medicine

## 2017-08-20 MED ORDER — LOVASTATIN 40 MG PO TABS: 40.0000 mg | ORAL_TABLET | Freq: Every day | ORAL | 3 refills | Status: DC

## 2017-09-03 ENCOUNTER — Encounter
Admission: RE | Admit: 2017-09-03 | Discharge: 2017-09-03 | Disposition: A | Discharge status: Home / Self Care | Payer: 59 | Source: Ambulatory Visit | Attending: Obstetrics and Gynecology | Admitting: Obstetrics and Gynecology

## 2017-09-03 ENCOUNTER — Other Ambulatory Visit: Payer: Self-pay

## 2017-09-03 ENCOUNTER — Ambulatory Visit (INDEPENDENT_AMBULATORY_CARE_PROVIDER_SITE_OTHER): Payer: 59

## 2017-09-03 ENCOUNTER — Ambulatory Visit: Payer: 59 | Admitting: Obstetrics and Gynecology

## 2017-09-03 VITALS — BP 132/74 | HR 92 | Ht 63.0 in | Wt 248.0 lb

## 2017-09-03 DIAGNOSIS — N950 Postmenopausal bleeding: Secondary | ICD-10-CM

## 2017-09-03 DIAGNOSIS — N840 Polyp of corpus uteri: Secondary | ICD-10-CM

## 2017-09-03 NOTE — Progress Notes (Signed)
Obstetrics & Gynecology Surgery H&P    Chief Complaint: Scheduled Surgery   History of Present Illness: Patient is a 66 y.o. G2P0 presenting for scheduled hysteroscopy, D&C, for the treatment or further evaluation of postmenopausal bleeding with concern for focal lesion raised on in office Pipelle biopsy showing fragment of benign endometrial polyp.   Preoperative Pap: 08/11/2017 ASCUS HPV negative Preoperative Endometrial biopsy: 08/11/2017 Inactive endometrium and fragment of benign endometrial polyp Preoperative Ultrasound: 09/03/2017 Findings: two calcified intramural fibroids.  Endometrial stripe thickened at 5.30mm, ovaries image normal.    Review of Systems:10 point review of systems  Past Medical History:  Past Medical History:  Diagnosis Date  . Arthritis   . Hyperlipidemia   . Hypertension   . Sinus infection   . Sleep apnea     Past Surgical History:  Past Surgical History:  Procedure Laterality Date  . CESAREAN SECTION    . COLONOSCOPY    . HEMORRHOID SURGERY      Family History:  Family History  Problem Relation Age of Onset  . Stroke Mother   . Diabetes Brother   . Rheumatic fever Brother   . Arthritis/Rheumatoid Brother     Social History:  Social History   Socioeconomic History  . Marital status: Married    Spouse name: Not on file  . Number of children: Not on file  . Years of education: Not on file  . Highest education level: Not on file  Occupational History  . Not on file  Social Needs  . Financial resource strain: Not on file  . Food insecurity:    Worry: Not on file    Inability: Not on file  . Transportation needs:    Medical: Not on file    Non-medical: Not on file  Tobacco Use  . Smoking status: Current Every Day Smoker    Packs/day: 1.00    Years: 30.00    Pack years: 30.00    Types: Cigarettes  . Smokeless tobacco: Never Used  Substance and Sexual Activity  . Alcohol use: Yes    Alcohol/week: 0.0 oz    Comment:  occasionally  . Drug use: No  . Sexual activity: Not Currently    Birth control/protection: None  Lifestyle  . Physical activity:    Days per week: Not on file    Minutes per session: Not on file  . Stress: Not on file  Relationships  . Social connections:    Talks on phone: Not on file    Gets together: Not on file    Attends religious service: Not on file    Active member of club or organization: Not on file    Attends meetings of clubs or organizations: Not on file    Relationship status: Not on file  . Intimate partner violence:    Fear of current or ex partner: Not on file    Emotionally abused: Not on file    Physically abused: Not on file    Forced sexual activity: Not on file  Other Topics Concern  . Not on file  Social History Narrative  . Not on file    Allergies:  No Known Allergies  Medications: Prior to Admission medications   Medication Sig Start Date End Date Taking? Authorizing Provider  albuterol (PROVENTIL HFA;VENTOLIN HFA) 108 (90 Base) MCG/ACT inhaler Inhale 2 puffs into the lungs every 6 (six) hours as needed for wheezing or shortness of breath. 05/20/17   Lisa Roca, MD  aspirin EC 81  MG tablet Take 81 mg by mouth daily with lunch.     [provider]  carvedilol (COREG) 25 MG tablet Take 25 mg by mouth daily with lunch.  01/11/15   [provider]  furosemide (LASIX) 20 MG tablet Take 20 mg by mouth every evening.  01/07/15   [provider]  losartan-hydrochlorothiazide (HYZAAR) 100-25 MG tablet Take 1 tablet by mouth daily with lunch.  01/11/15   [provider]  lovastatin (MEVACOR) 40 MG tablet Take 1 tablet (40 mg total) by mouth at bedtime. Patient taking differently: Take 40 mg by mouth daily with lunch.  08/20/17   Lavera Guise, MD  naproxen sodium (ALEVE) 220 MG tablet Take 440 mg by mouth daily as needed (pain).    [provider]  potassium chloride SA (K-DUR,KLOR-CON) 20 MEQ tablet Take 1 tablet (20  mEq total) by mouth daily. Patient taking differently: Take 20 mEq by mouth daily with lunch.  07/21/17   Ronnell Freshwater, NP  triamcinolone cream (KENALOG) 0.5 % APPLY TO AFFECTED AREA TWICE DAILY FOR RASH 07/05/17   [provider]    Physical Exam Vitals: Blood pressure 132/74, pulse 92, height 5\' 3"  (1.6 m), weight 248 lb (112.5 kg).  General: NAD HEENT: normocephalic, anicteric Pulmonary: CTAB, no increased work of breathing Cardiovascular: RRR, distal pulses 2+ Abdomen: soft, non-tender Extremities: no edema, erythema, or tenderness Neurologic: Grossly intact Psychiatric: mood appropriate, affect full  Imaging US Pelvis Transvanginal Non-ob (tv Only)  Result Date: 09/03/2017 Patient Name: Tiffany Schaefer DOB: 1950/12/26 MRN: [id number] ULTRASOUND REPORT Location: [facility] Date of Service: 09/03/2017 Indications:PMB Findings: The uterus is anteverted and measures 6.06 X 4.42 X 3.20 CM. Echo texture is homogenous with evidence of focal masses. Within the uterus are multiple suspected fibroids measuring: Fibroid 1:Calcified IM measures 22 x 17 mm Fibroid 2:Calcified IM measures 19 x 14 mm The Endometrium measures 5.94 mm. Right Ovary measures 1.81 x 1.10 x 1.25 cm. It is normal in appearance. Left Ovary measures 2.30 x 1.46 x 1.66 cm. It is normal in appearance. Survey of the adnexa demonstrates no adnexal masses. There is no free fluid in the cul de sac. Impression: 1. Anteverted uterus with two calcified fibroids. 2. Intramural measures 1) 22 x 17 mm, 2) 19 x 14 mm Recommendations: 1.Clinical correlation with the patient's History and Physical Exam. Mital bahen P Patel, RDMS Ultrasound today reveals two small calcified intramural fibroids the largest measuring 22 x 34mm.  The endometrium is thickened in the setting of postmenopausal bleeding at 5.31mm.  No focal endometrial lesions are visualized.  Ovaries image normally.  Malachy Mood, MD, [facility], [facility] 09/03/2017, 10:05 AM    Assessment: 67 y.o. G2P0 presenting for scheduled hysteroscopy, D&C  Plan: 1) I have discussed with the patient the indications for the procedure. Included in the discussion were the options of therapy, as wall as their individual risks, benefits, and complications. Ample time was given to answer all questions.   In office pipelle biopsy generally provides comparable results to [facility], however this sampling modality may miss focal lesions if these were previously documented on ultrasound.  It is because of the potential to miss focal lesions that hysteroscopy D&C is also warranted in patient with continued postmenopausal bleeding that is not self limited regardless of prior in office biopsy results or ultrasound findings.  She understands that the risk of continued observation include worsening bleeding or worsening of any underlying  pathology.  The choices include: 1. Doing nothing but following her symptoms 2. Attempts at hormonal manipulation with either BCP or Depo-Provera for premenopausal patients with no concern for focal lesion or endometrial pathology 3. D&C/hysteroscopy. 4. Endometrial ablation via Novasure or other techniques for premenopausal patients with no concern for focal lesion or endometrial pathology  5. As final resort, hysterectomy. After consideration of her history and findings, mutual decision has been made to proceed with D+C/hysteroscopy. While the incidence is low, the risks from this surgery include, but are not limited to, the risks of anesthesia, hemorrhage, infection, perforation, and injury to adjacent structures including bowel, bladder and blood vessels.   2) Routine postoperative instructions were reviewed with the patient and her family in detail today including the expected length of recovery and likely postoperative course.  The patient concurred with the proposed plan, giving informed written consent for the surgery today.  Patient  instructed on the importance of being NPO after midnight prior to her procedure.  If warranted preoperative prophylactic antibiotics and SCDs ordered on call to the OR to meet SCIP guidelines and adhere to recommendation laid forth in [place] Number 104 May 2009  "Antibiotic Prophylaxis for Gynecologic Procedures".     Malachy Mood, MD, [facility], [facility] 09/03/2017, 10:21 AM

## 2017-09-03 NOTE — Pre-Procedure Instructions (Signed)
Patient states labs at drawn at [place] office last month. Request sent for copies.

## 2017-09-03 NOTE — Patient Instructions (Signed)
Your procedure is scheduled on: Thursday 09/11/17 Report to [facility]. To find out your arrival time please call [phone] between 1PM - 3PM on Wednesday 09/10/17.  Remember: Instructions that are not followed completely may result in serious medical risk, up to and including death, or upon the discretion of your surgeon and anesthesiologist your surgery may need to be rescheduled.     _X__ 1. Do not eat food after midnight the night before your procedure.                 No gum chewing or hard candies. You may drink clear liquids up to 2 hours                 before you are scheduled to arrive for your surgery- DO not drink clear                 liquids within 2 hours of the start of your surgery.                 Clear Liquids include:  water, apple juice without pulp, clear carbohydrate                 drink such as Clearfast or Gatorade, Black Coffee or Tea (Do not add                 anything to coffee or tea).  __X__2.  On the morning of surgery brush your teeth with toothpaste and water, you                 may rinse your mouth with mouthwash if you wish.  Do not swallow any              toothpaste of mouthwash.     _X__ 3.  No Alcohol for 24 hours before or after surgery.   _X__ 4.  Do Not Smoke or use e-cigarettes For 24 Hours Prior to Your Surgery.                 Do not use any chewable tobacco products for at least 6 hours prior to                 surgery.  ____  5.  Bring all medications with you on the day of surgery if instructed.   __X__  6.  Notify your doctor if there is any change in your medical condition      (cold, fever, infections).     Do not wear jewelry, make-up, hairpins, clips or nail polish. Do not wear lotions, powders, or perfumes.  Do not shave 48 hours prior to surgery. Men may shave face and neck. Do not bring valuables to the hospital.    [facility] is not responsible for any belongings or  valuables.  Contacts, dentures/partials or body piercings may not be worn into surgery. Bring a case for your contacts, glasses or hearing aids, a denture cup will be supplied. Leave your suitcase in the car. After surgery it may be brought to your room. For patients admitted to the hospital, discharge time is determined by your treatment team.   Patients discharged the day of surgery will not be allowed to drive home.   Please read over the following fact sheets that you were given:   MRSA Information  __X__ Take these medicines the morning of surgery with A SIP OF WATER:  1. carvedilol  2. lovastatin  3.   4.  5.  6.  ____ Fleet Enema (as directed)   __ __ Use CHG Soap/SAGE wipes as directed  __X__ Use inhalers on the day of surgery  ____ Stop metformin/Janumet/Farxiga 2 days prior to surgery    ____ Take 1/2 of usual insulin dose the night before surgery. No insulin the morning          of surgery.   __X__ Stop Blood Thinners Coumadin/Plavix/Xarelto/Pleta/Pradaxa/Eliquis/Effient/Aspirin  on TODAY  Or contact your Surgeon, Cardiologist or Medical Doctor regarding  ability to stop your blood thinners  __X__ Stop Anti-inflammatories 7 days before surgery such as Advil, Ibuprofen, Motrin,  BC or Goodies Powder, Naprosyn, Naproxen, Aleve, Aspirin    __X__ Stop all herbal supplements, fish oil or vitamin E until after surgery. TODAY   ____ Bring C-Pap to the hospital.

## 2017-09-08 ENCOUNTER — Telehealth: Payer: Self-pay | Admitting: Obstetrics and Gynecology

## 2017-09-08 NOTE — Telephone Encounter (Signed)
LVM for patient to call me back 

## 2017-09-08 NOTE — Telephone Encounter (Signed)
Routing to [employer] to advise patient

## 2017-09-08 NOTE — Telephone Encounter (Signed)
No if she discontinues now we should be more than fine.  I have patient who are fully anticoagulated for her type of surgery without bleeding concerns

## 2017-09-08 NOTE — Telephone Encounter (Signed)
Patient is calling about her up coming surgery. Patient reports taking aspirins( one baby aspirin) on Saturday and (one baby aspirins)On Sunday. Patient states she forgot that she wasn't suppose to  Take it prior to surgery.and is wanting to know if she needs to reschedule her surgery with Dr. Georgianne Fick on Thursday, May 9. Please advise patient is at work but can be reached at lunch time today

## 2017-09-09 NOTE — Telephone Encounter (Signed)
Another message left for patient to cal me back. I did not leave on voicemail d/t it is automated and does not give her name. KJ CMA

## 2017-09-10 NOTE — Telephone Encounter (Signed)
Pt aware via voicemail, not to continue aspirin.

## 2017-09-11 ENCOUNTER — Ambulatory Visit: Payer: 59 | Admitting: Anesthesiology

## 2017-09-11 ENCOUNTER — Encounter: Payer: Self-pay | Admitting: *Deleted

## 2017-09-11 ENCOUNTER — Encounter
Admission: RE | Disposition: A | Discharge status: Home / Self Care | Payer: Self-pay | Source: Ambulatory Visit | Attending: Obstetrics and Gynecology

## 2017-09-11 ENCOUNTER — Other Ambulatory Visit: Payer: Self-pay

## 2017-09-11 ENCOUNTER — Ambulatory Visit
Admission: RE | Admit: 2017-09-11 | Discharge: 2017-09-11 | Disposition: A | Discharge status: Home / Self Care | Payer: 59 | Source: Ambulatory Visit | Attending: Obstetrics and Gynecology | Admitting: Obstetrics and Gynecology

## 2017-09-11 DIAGNOSIS — E785 Hyperlipidemia, unspecified: Secondary | ICD-10-CM | POA: Insufficient documentation

## 2017-09-11 DIAGNOSIS — M1990 Unspecified osteoarthritis, unspecified site: Secondary | ICD-10-CM | POA: Diagnosis not present

## 2017-09-11 DIAGNOSIS — N840 Polyp of corpus uteri: Secondary | ICD-10-CM | POA: Diagnosis not present

## 2017-09-11 DIAGNOSIS — N950 Postmenopausal bleeding: Secondary | ICD-10-CM

## 2017-09-11 DIAGNOSIS — F17210 Nicotine dependence, cigarettes, uncomplicated: Secondary | ICD-10-CM | POA: Insufficient documentation

## 2017-09-11 DIAGNOSIS — Z7982 Long term (current) use of aspirin: Secondary | ICD-10-CM | POA: Diagnosis not present

## 2017-09-11 DIAGNOSIS — G4730 Sleep apnea, unspecified: Secondary | ICD-10-CM | POA: Diagnosis not present

## 2017-09-11 DIAGNOSIS — I10 Essential (primary) hypertension: Secondary | ICD-10-CM | POA: Insufficient documentation

## 2017-09-11 DIAGNOSIS — Z79899 Other long term (current) drug therapy: Secondary | ICD-10-CM | POA: Diagnosis not present

## 2017-09-11 DIAGNOSIS — Z9889 Other specified postprocedural states: Secondary | ICD-10-CM

## 2017-09-11 HISTORY — PX: HYSTEROSCOPY WITH D & C: SHX1775

## 2017-09-11 LAB — HEMOGLOBIN: Hemoglobin: 13 g/dL (ref 12.0–16.0)

## 2017-09-11 SURGERY — DILATATION AND CURETTAGE /HYSTEROSCOPY
Anesthesia: General

## 2017-09-11 MED ORDER — FENTANYL CITRATE (PF) 100 MCG/2ML IJ SOLN: 25.0000 ug | INTRAMUSCULAR | Status: DC | PRN

## 2017-09-11 MED ORDER — HYDROCODONE-ACETAMINOPHEN 5-325 MG PO TABS: 1.0000 | ORAL_TABLET | Freq: Four times a day (QID) | ORAL | 0 refills | Status: DC | PRN

## 2017-09-11 MED ORDER — FAMOTIDINE 20 MG PO TABS
ORAL_TABLET | ORAL | Status: AC
  Administered 2017-09-11: 20 mg via ORAL
  Filled 2017-09-11: qty 1

## 2017-09-11 MED ORDER — FAMOTIDINE 20 MG PO TABS
20.0000 mg | ORAL_TABLET | Freq: Once | ORAL | Status: AC
  Administered 2017-09-11: 20 mg via ORAL

## 2017-09-11 MED ORDER — LACTATED RINGERS IV SOLN
INTRAVENOUS | Status: DC
  Administered 2017-09-11: 15:00:00 via INTRAVENOUS

## 2017-09-11 MED ORDER — FENTANYL CITRATE (PF) 100 MCG/2ML IJ SOLN
INTRAMUSCULAR | Status: AC
  Filled 2017-09-11: qty 2

## 2017-09-11 MED ORDER — PROPOFOL 10 MG/ML IV BOLUS
INTRAVENOUS | Status: AC
  Filled 2017-09-11: qty 20

## 2017-09-11 MED ORDER — KETOROLAC TROMETHAMINE 30 MG/ML IJ SOLN
INTRAMUSCULAR | Status: DC | PRN
  Administered 2017-09-11: 30 mg via INTRAVENOUS

## 2017-09-11 MED ORDER — MEPERIDINE HCL 50 MG/ML IJ SOLN: 6.2500 mg | INTRAMUSCULAR | Status: DC | PRN

## 2017-09-11 MED ORDER — OXYCODONE HCL 5 MG/5ML PO SOLN: 5.0000 mg | Freq: Once | ORAL | Status: DC | PRN

## 2017-09-11 MED ORDER — ONDANSETRON HCL 4 MG/2ML IJ SOLN
INTRAMUSCULAR | Status: DC | PRN
  Administered 2017-09-11: 4 mg via INTRAVENOUS

## 2017-09-11 MED ORDER — SUGAMMADEX SODIUM 200 MG/2ML IV SOLN
INTRAVENOUS | Status: AC
  Filled 2017-09-11: qty 2

## 2017-09-11 MED ORDER — PROPOFOL 10 MG/ML IV BOLUS
INTRAVENOUS | Status: DC | PRN
  Administered 2017-09-11: 200 mg via INTRAVENOUS

## 2017-09-11 MED ORDER — KETOROLAC TROMETHAMINE 30 MG/ML IJ SOLN
INTRAMUSCULAR | Status: AC
  Filled 2017-09-11: qty 1

## 2017-09-11 MED ORDER — ONDANSETRON HCL 4 MG/2ML IJ SOLN
INTRAMUSCULAR | Status: AC
  Filled 2017-09-11: qty 2

## 2017-09-11 MED ORDER — LIDOCAINE HCL (PF) 2 % IJ SOLN
INTRAMUSCULAR | Status: AC
  Filled 2017-09-11: qty 10

## 2017-09-11 MED ORDER — PROMETHAZINE HCL 25 MG/ML IJ SOLN: 6.2500 mg | INTRAMUSCULAR | Status: DC | PRN

## 2017-09-11 MED ORDER — MIDAZOLAM HCL 5 MG/5ML IJ SOLN
INTRAMUSCULAR | Status: DC | PRN
  Administered 2017-09-11: 2 mg via INTRAVENOUS

## 2017-09-11 MED ORDER — SEVOFLURANE IN SOLN
RESPIRATORY_TRACT | Status: AC
  Filled 2017-09-11: qty 250

## 2017-09-11 MED ORDER — DEXAMETHASONE SODIUM PHOSPHATE 10 MG/ML IJ SOLN
INTRAMUSCULAR | Status: AC
  Filled 2017-09-11: qty 1

## 2017-09-11 MED ORDER — DEXAMETHASONE SODIUM PHOSPHATE 10 MG/ML IJ SOLN
INTRAMUSCULAR | Status: DC | PRN
  Administered 2017-09-11: 8 mg via INTRAVENOUS

## 2017-09-11 MED ORDER — LIDOCAINE HCL (CARDIAC) PF 100 MG/5ML IV SOSY
PREFILLED_SYRINGE | INTRAVENOUS | Status: DC | PRN
  Administered 2017-09-11: 100 mg via INTRAVENOUS

## 2017-09-11 MED ORDER — OXYCODONE HCL 5 MG PO TABS: 5.0000 mg | ORAL_TABLET | Freq: Once | ORAL | Status: DC | PRN

## 2017-09-11 MED ORDER — FENTANYL CITRATE (PF) 100 MCG/2ML IJ SOLN
INTRAMUSCULAR | Status: DC | PRN
  Administered 2017-09-11 (×2): 25 ug via INTRAVENOUS

## 2017-09-11 MED ORDER — MIDAZOLAM HCL 2 MG/2ML IJ SOLN
INTRAMUSCULAR | Status: AC
  Filled 2017-09-11: qty 2

## 2017-09-11 SURGICAL SUPPLY — 16 items
CATH ROBINSON RED A/P 16FR (CATHETERS) ×3 IMPLANT
ELECT REM PT RETURN 9FT ADLT (ELECTROSURGICAL) ×3
ELECTRODE REM PT RTRN 9FT ADLT (ELECTROSURGICAL) ×1 IMPLANT
GLOVE BIO SURGEON STRL SZ7 (GLOVE) ×3 IMPLANT
GLOVE INDICATOR 7.5 STRL GRN (GLOVE) ×3 IMPLANT
GOWN STRL REUS W/ TWL LRG LVL3 (GOWN DISPOSABLE) ×2 IMPLANT
GOWN STRL REUS W/TWL LRG LVL3 (GOWN DISPOSABLE) ×4
IV LACTATED RINGERS 1000ML (IV SOLUTION) ×3 IMPLANT
KIT TURNOVER CYSTO (KITS) ×3 IMPLANT
MYOSURE LITE POLYP REMOVAL (MISCELLANEOUS) ×3 IMPLANT
PACK DNC HYST (MISCELLANEOUS) ×3 IMPLANT
PAD OB MATERNITY 4.3X12.25 (PERSONAL CARE ITEMS) ×3 IMPLANT
PAD PREP 24X41 OB/GYN DISP (PERSONAL CARE ITEMS) ×3 IMPLANT
TOWEL OR 17X26 4PK STRL BLUE (TOWEL DISPOSABLE) ×3 IMPLANT
TUBING CONNECTING 10 (TUBING) ×2 IMPLANT
TUBING CONNECTING 10' (TUBING) ×1

## 2017-09-11 NOTE — Anesthesia Postprocedure Evaluation (Signed)
Anesthesia Post Note  Patient: Tiffany Schaefer  Procedure(s) Performed: DILATATION AND CURETTAGE /HYSTEROSCOPY (N/A )  Patient location during evaluation: PACU Anesthesia Type: General Level of consciousness: awake and alert Pain management: pain level controlled Vital Signs Assessment: post-procedure vital signs reviewed and stable Respiratory status: spontaneous breathing, nonlabored ventilation, respiratory function stable and patient connected to nasal cannula oxygen Cardiovascular status: blood pressure returned to baseline and stable Postop Assessment: no apparent nausea or vomiting Anesthetic complications: no     Last Vitals:  Vitals:   09/11/17 1605 09/11/17 1610  BP: 124/68 123/79  Pulse: 71 71  Resp: 17 18  Temp: (!) 36.3 C (!) 36.4 C  SpO2: 99% 98%    Last Pain:  Vitals:   09/11/17 1610  TempSrc: Temporal  PainSc: 0-No pain                 Essam Lowdermilk S

## 2017-09-11 NOTE — Anesthesia Preprocedure Evaluation (Signed)
Anesthesia Evaluation  Patient identified by MRN, date of birth, ID band Patient awake    Reviewed: Allergy & Precautions, NPO status , Patient's Chart, lab work & pertinent test results  History of Anesthesia Complications Negative for: history of anesthetic complications  Airway Mallampati: III  TM Distance: >3 FB Neck ROM: Full    Dental  (+) Edentulous Upper, Edentulous Lower   Pulmonary sleep apnea and Continuous Positive Airway Pressure Ventilation , neg COPD, Current Smoker,    breath sounds clear to auscultation- rhonchi (-) wheezing      Cardiovascular hypertension, Pt. on medications (-) CAD, (-) Past MI, (-) Cardiac Stents and (-) CABG  Rhythm:Regular Rate:Normal - Systolic murmurs and - Diastolic murmurs    Neuro/Psych negative neurological ROS  negative psych ROS   GI/Hepatic negative GI ROS, Neg liver ROS,   Endo/Other  negative endocrine ROSneg diabetes  Renal/GU negative Renal ROS     Musculoskeletal  (+) Arthritis ,   Abdominal (+) + obese,   Peds  Hematology negative hematology ROS (+)   Anesthesia Other Findings Past Medical History: No date: Arthritis No date: Hyperlipidemia No date: Hypertension No date: Sinus infection No date: Sleep apnea   Reproductive/Obstetrics                             Anesthesia Physical Anesthesia Plan  ASA: II  Anesthesia Plan: General   Post-op Pain Management:    Induction: Intravenous  PONV Risk Score and Plan: 1 and Ondansetron and Midazolam  Airway Management Planned: LMA  Additional Equipment:   Intra-op Plan:   Post-operative Plan:   Informed Consent: I have reviewed the patients History and Physical, chart, labs and discussed the procedure including the risks, benefits and alternatives for the proposed anesthesia with the patient or authorized representative who has indicated his/her understanding and acceptance.    Dental advisory given  Plan Discussed with: CRNA and Anesthesiologist  Anesthesia Plan Comments:         Anesthesia Quick Evaluation

## 2017-09-11 NOTE — H&P (Signed)
Date of Initial H&P: 09/03/2017  History reviewed, patient examined, no change in status, stable for surgery.

## 2017-09-11 NOTE — Anesthesia Post-op Follow-up Note (Signed)
Anesthesia QCDR form completed.        

## 2017-09-11 NOTE — Discharge Instructions (Signed)

## 2017-09-11 NOTE — Anesthesia Procedure Notes (Signed)
Procedure Name: LMA Insertion Date/Time: 09/11/2017 2:58 PM Performed by: Dionne Bucy, CRNA Pre-anesthesia Checklist: Patient identified, Patient being monitored, Timeout performed, Emergency Drugs available and Suction available Patient Re-evaluated:Patient Re-evaluated prior to induction Oxygen Delivery Method: Circle system utilized Preoxygenation: Pre-oxygenation with 100% oxygen Induction Type: IV induction Ventilation: Mask ventilation without difficulty LMA: LMA inserted LMA Size: 4.0 Tube type: Oral Number of attempts: 1 Placement Confirmation: positive ETCO2 and breath sounds checked- equal and bilateral Tube secured with: Tape Dental Injury: Teeth and Oropharynx as per pre-operative assessment

## 2017-09-11 NOTE — Op Note (Signed)
Preoperative Diagnosis: 1) 67 y.o. with postmenopausal bleeding 2) Endometrial polyp fragment on in office biopsy  Postoperative Diagnosis: 1) 67 y.o. with postmenopausal bleeding 2) Endometrial polyp  Operation Performed: Hysteroscopy, dilation and curettage  Indication: Postmenopausal bleeding with in office endometrial biopsy showing endometrial polyp fragments in background of inactive endometrium  Anesthesia: General  Primary Surgeon: Malachy Mood, MD  Assistant: none  Preoperative Antibiotics: none  Estimated Blood Loss: 2 mL  IV Fluids: 661mL  Urine Output:: ~72mL straight cath  Drains or Tubes: none  Implants: none  Specimens Removed: endometrial curettings and polyp  Complications: none  Intraoperative Findings:  Normal cervix, normal uterine cavity other than small polyp stalk left lower uterine segment, normal tubal ostia.    Patient Condition: stable  Procedure in Detail:  Patient was taken to the operating room were she was administered general endotracheal anesthesia.  She was positioned in the dorsal lithotomy position utilizing Allen stirups, prepped and draped in the usual sterile fashion.  Uterus was noted to be non-enlarged in size, anteverted.   Prior to proceeding with the case a time out was performed.  Attention was turned to the patient's pelvis.  A red rubber catheter was used to empty the patient's bladder.  An operative speculum was placed to allow visualization of the cervix.  The anterior lip of the cervix was grasped with a single tooth tenaculum and the cervix was sequentially dilated using pratt dilators.  The hysteroscope was then advanced into the uterine cavity noting the above findings.  There was small endometrial polyp in the left lower uterine segment which was removed using a Myosure device.  Curettage was performed and the resulting specimen collected and sent to pathology.    The single tooth tenaculum was removed from the cervix.   The tenaculum sites and cervix were noted to be  Hemostatic before removing the operative speculum.  Sponge needle and instrument counts were corrects times two.  The patient tolerated the procedure well and was taken to the recovery room in stable condition.

## 2017-09-11 NOTE — Transfer of Care (Signed)
Immediate Anesthesia Transfer of Care Note  Patient: Tiffany Schaefer  Procedure(s) Performed: DILATATION AND CURETTAGE /HYSTEROSCOPY (N/A )  Patient Location: PACU  Anesthesia Type:General  Level of Consciousness: sedated  Airway & Oxygen Therapy: Patient Spontanous Breathing and Patient connected to face mask oxygen  Post-op Assessment: Report given to RN and Post -op Vital signs reviewed and stable  Post vital signs: Reviewed and stable  Last Vitals:  Vitals Value Taken Time  BP 100/65 09/11/2017  3:38 PM  Temp 36.2 C 09/11/2017  3:35 PM  Pulse 70 09/11/2017  3:39 PM  Resp 13 09/11/2017  3:39 PM  SpO2 100 % 09/11/2017  3:39 PM  Vitals shown include unvalidated device data.  Last Pain:  Vitals:   09/11/17 1535  TempSrc:   PainSc: Asleep         Complications: No apparent anesthesia complications

## 2017-09-12 ENCOUNTER — Encounter: Payer: Self-pay | Admitting: Obstetrics and Gynecology

## 2017-09-15 LAB — SURGICAL PATHOLOGY

## 2017-09-17 ENCOUNTER — Ambulatory Visit (INDEPENDENT_AMBULATORY_CARE_PROVIDER_SITE_OTHER): Payer: 59 | Admitting: Obstetrics and Gynecology

## 2017-09-17 ENCOUNTER — Encounter: Payer: Self-pay | Admitting: Obstetrics and Gynecology

## 2017-09-17 VITALS — BP 110/66 | HR 88 | Ht 63.0 in | Wt 250.0 lb

## 2017-09-17 DIAGNOSIS — Z4889 Encounter for other specified surgical aftercare: Secondary | ICD-10-CM

## 2017-09-17 NOTE — Progress Notes (Signed)
      Postoperative Follow-up Patient presents post op from hysteroscopy, D&C 1weeks ago for PMB.  Subjective: Patient reports some improvement in her preop symptoms. Eating a regular diet without difficulty. The patient is not having any pain.  Activity: normal activities of daily living. Still some light spotting  Objective: Blood pressure 110/66, pulse 88, height 5\' 3"  (1.6 m), weight 250 lb (113.4 kg).  Gen: NAD HEENT: normocephalic, anicteric Pulmonary: no increased work of breathing Psych: mood appropriate  Admission on 09/11/2017, Discharged on 09/11/2017  Component Date Value Ref Range Status  . Hemoglobin 09/11/2017 13.0  12.0 - 16.0 g/dL Final   Performed at [facility], [address] [place], [place] [address]  . SURGICAL PATHOLOGY 09/11/2017    Final                   Value:Surgical Pathology CASE: [id number] PATIENT: Karen Chafe Surgical Pathology Report     SPECIMEN SUBMITTED: A. Endometrial polyp and curettings  CLINICAL HISTORY: None provided  PRE-OPERATIVE DIAGNOSIS: Endometrial polyp, post menopausal bleeding  POST-OPERATIVE DIAGNOSIS: None provided.     DIAGNOSIS: A.  ENDOMETRIAL POLYP AND CURETTINGS; DILATATION AND CURETTAGE: - FRAGMENTS OF BENIGN ENDOMETRIAL POLYP. - NEGATIVE FOR ATYPIA AND MALIGNANCY.   GROSS DESCRIPTION: A. Labeled: Endometrial polyp and curettings Received: In formalin Tissue fragment(s): Multiple Size: Aggregate, 2.5 x 1.5 x 0.1 cm Description: Rubbery tan-brown fragments Entirely submitted in one cassette.    Final Diagnosis performed by Delorse Lek, MD.   Electronically signed 09/15/2017 10:47:00AM The electronic signature indicates that the named Attending Pathologist has evaluated the specimen  Technical component performed at [facility], [address] [place], [place] [address] Lab: 8                         [phone] Dir: Rush Farmer, MD, MMM  Professional component performed at [facility],  [facility], [address], [place], [place] [address] Lab: [phone] Dir: Dellia Nims. Reuel Derby, MD     Assessment: 67 y.o. s/p hysteroscopy, D&C stable  Plan: Patient has done well after surgery with no apparent complications.  I have discussed the post-operative course to date, and the expected progress moving forward.  The patient understands what complications to be concerned about.  I will see the patient in routine follow up, or sooner if needed.    Activity plan: No restriction.   Malachy Mood, MD, [facility], [place]

## 2017-10-03 ENCOUNTER — Other Ambulatory Visit: Payer: Self-pay

## 2017-10-03 MED ORDER — LOSARTAN POTASSIUM-HCTZ 100-25 MG PO TABS: 1.0000 | ORAL_TABLET | Freq: Every day | ORAL | 1 refills | Status: DC

## 2017-10-17 ENCOUNTER — Ambulatory Visit: Payer: 59 | Admitting: Obstetrics and Gynecology

## 2017-10-20 ENCOUNTER — Other Ambulatory Visit: Payer: Self-pay

## 2017-10-20 ENCOUNTER — Ambulatory Visit: Payer: 59 | Admitting: Nurse Practitioner

## 2017-10-20 ENCOUNTER — Encounter: Payer: Self-pay | Admitting: Nurse Practitioner

## 2017-10-20 VITALS — BP 109/63 | HR 76 | Resp 16 | Ht 63.0 in | Wt 250.0 lb

## 2017-10-20 DIAGNOSIS — E782 Mixed hyperlipidemia: Secondary | ICD-10-CM | POA: Diagnosis not present

## 2017-10-20 DIAGNOSIS — R609 Edema, unspecified: Secondary | ICD-10-CM | POA: Diagnosis not present

## 2017-10-20 DIAGNOSIS — E876 Hypokalemia: Secondary | ICD-10-CM | POA: Insufficient documentation

## 2017-10-20 DIAGNOSIS — I10 Essential (primary) hypertension: Secondary | ICD-10-CM | POA: Diagnosis not present

## 2017-10-20 DIAGNOSIS — Z1239 Encounter for other screening for malignant neoplasm of breast: Secondary | ICD-10-CM

## 2017-10-20 DIAGNOSIS — Z1231 Encounter for screening mammogram for malignant neoplasm of breast: Secondary | ICD-10-CM

## 2017-10-20 MED ORDER — LOSARTAN POTASSIUM-HCTZ 100-25 MG PO TABS: 1.0000 | ORAL_TABLET | Freq: Every day | ORAL | 5 refills | Status: DC

## 2017-10-20 MED ORDER — LOVASTATIN 40 MG PO TABS: 40.0000 mg | ORAL_TABLET | Freq: Every day | ORAL | 3 refills | Status: DC

## 2017-10-20 MED ORDER — FUROSEMIDE 20 MG PO TABS: 20.0000 mg | ORAL_TABLET | Freq: Every evening | ORAL | 5 refills | Status: DC

## 2017-10-20 MED ORDER — CARVEDILOL 25 MG PO TABS: 25.0000 mg | ORAL_TABLET | Freq: Every day | ORAL | 5 refills | Status: DC

## 2017-10-20 MED ORDER — POTASSIUM CHLORIDE CRYS ER 20 MEQ PO TBCR: 20.0000 meq | EXTENDED_RELEASE_TABLET | Freq: Every day | ORAL | 5 refills | Status: DC

## 2017-10-20 NOTE — Progress Notes (Signed)
[facility] [place], [place] [address]  Internal MEDICINE  Office Visit Note  Patient Name: Tiffany Schaefer  [id number]  [id number]  Date of Service: 11/09/2017  Chief Complaint  Patient presents with  . Hypertension    Hypertension  This is a chronic problem. The current episode started more than 1 year ago. The problem has been gradually improving since onset. The problem is controlled. Pertinent negatives include no chest pain, headaches, malaise/fatigue, neck pain, palpitations, peripheral edema or shortness of breath. There are no associated agents to hypertension. Risk factors for coronary artery disease include obesity, post-menopausal state, stress and smoking/tobacco exposure. Past treatments include angiotensin blockers and diuretics. The current treatment provides significant improvement.    Pt is here for routine follow up.    Current Medication: Outpatient Encounter Medications as of 10/20/2017  Medication Sig Note  . albuterol (PROVENTIL HFA;VENTOLIN HFA) 108 (90 Base) MCG/ACT inhaler Inhale 2 puffs into the lungs every 6 (six) hours as needed for wheezing or shortness of breath.   Marland Kitchen aspirin EC 81 MG tablet Take 81 mg by mouth daily with lunch.    . carvedilol (COREG) 25 MG tablet Take 1 tablet (25 mg total) by mouth daily with lunch.   . furosemide (LASIX) 20 MG tablet Take 1 tablet (20 mg total) by mouth every evening.   Marland Kitchen losartan-hydrochlorothiazide (HYZAAR) 100-25 MG tablet Take 1 tablet by mouth daily with lunch.   . lovastatin (MEVACOR) 40 MG tablet Take 1 tablet (40 mg total) by mouth at bedtime.   . naproxen sodium (ALEVE) 220 MG tablet Take 440 mg by mouth daily as needed (pain).   . potassium chloride SA (K-DUR,KLOR-CON) 20 MEQ tablet Take 1 tablet (20 mEq total) by mouth daily.   Marland Kitchen triamcinolone cream (KENALOG) 0.5 % APPLY TO AFFECTED AREA TWICE DAILY FOR RASH   . [DISCONTINUED] carvedilol (COREG) 25 MG tablet Take 25 mg by mouth daily  with lunch.  08/27/2017: Pt confirms she only takes 1 tablet daily  . [DISCONTINUED] furosemide (LASIX) 20 MG tablet Take 1 tablet (20 mg total) by mouth every evening.   . [DISCONTINUED] losartan-hydrochlorothiazide (HYZAAR) 100-25 MG tablet Take 1 tablet by mouth daily with lunch.   . [DISCONTINUED] lovastatin (MEVACOR) 40 MG tablet Take 1 tablet (40 mg total) by mouth at bedtime. (Patient taking differently: Take 40 mg by mouth daily with lunch. )   . [DISCONTINUED] potassium chloride SA (K-DUR,KLOR-CON) 20 MEQ tablet Take 1 tablet (20 mEq total) by mouth daily. (Patient taking differently: Take 20 mEq by mouth daily with lunch. )   . [DISCONTINUED] HYDROcodone-acetaminophen (NORCO/VICODIN) 5-325 MG tablet Take 1 tablet by mouth every 6 (six) hours as needed. (Patient not taking: Reported on 09/17/2017)    No facility-administered encounter medications on file as of 10/20/2017.     Surgical History: Past Surgical History:  Procedure Laterality Date  . CESAREAN SECTION     denies 09/03/17  . COLONOSCOPY    . HEMORRHOID SURGERY    . HYSTEROSCOPY W/D&C N/A 09/11/2017   Procedure: DILATATION AND CURETTAGE /HYSTEROSCOPY;  Surgeon: Malachy Mood, MD;  Location: [facility] ORS;  Service: Gynecology;  Laterality: N/A;    Medical History: Past Medical History:  Diagnosis Date  . Arthritis   . Hyperlipidemia   . Hypertension   . Sinus infection   . Sleep apnea     Family History: Family History  Problem Relation Age of Onset  . Stroke Mother   . Diabetes Brother   .  Rheumatic fever Brother   . Arthritis/Rheumatoid Brother     Social History   Socioeconomic History  . Marital status: Married    Spouse name: Not on file  . Number of children: Not on file  . Years of education: Not on file  . Highest education level: Not on file  Occupational History  . Not on file  Social Needs  . Financial resource strain: Not on file  . Food insecurity:    Worry: Not on file    Inability: Not on  file  . Transportation needs:    Medical: Not on file    Non-medical: Not on file  Tobacco Use  . Smoking status: Current Every Day Smoker    Packs/day: 1.00    Years: 30.00    Pack years: 30.00    Types: Cigarettes  . Smokeless tobacco: Never Used  Substance and Sexual Activity  . Alcohol use: Yes    Alcohol/week: 0.0 oz    Comment: occasionally  . Drug use: No  . Sexual activity: Not Currently    Birth control/protection: None  Lifestyle  . Physical activity:    Days per week: Not on file    Minutes per session: Not on file  . Stress: Not on file  Relationships  . Social connections:    Talks on phone: Not on file    Gets together: Not on file    Attends religious service: Not on file    Active member of club or organization: Not on file    Attends meetings of clubs or organizations: Not on file    Relationship status: Not on file  . Intimate partner violence:    Fear of current or ex partner: Not on file    Emotionally abused: Not on file    Physically abused: Not on file    Forced sexual activity: Not on file  Other Topics Concern  . Not on file  Social History Narrative  . Not on file      Review of Systems  Constitutional: Negative for activity change, chills, fatigue, malaise/fatigue and unexpected weight change.  HENT: Negative for congestion, postnasal drip, rhinorrhea, sneezing and sore throat.   Eyes: Negative.  Negative for redness.  Respiratory: Negative for cough, chest tightness, shortness of breath and wheezing.   Cardiovascular: Negative for chest pain and palpitations.  Gastrointestinal: Negative for abdominal pain, constipation, diarrhea, nausea and vomiting.  Endocrine: Negative for cold intolerance, heat intolerance, polydipsia, polyphagia and polyuria.  Genitourinary: Negative for dysuria, frequency and hematuria.  Musculoskeletal: Negative for arthralgias, back pain, joint swelling and neck pain.  Skin: Negative for rash.   Allergic/Immunologic: Positive for environmental allergies.  Neurological: Negative for dizziness, tremors, numbness and headaches.  Hematological: Negative for adenopathy. Does not bruise/bleed easily.  Psychiatric/Behavioral: Negative for behavioral problems (Depression), sleep disturbance and suicidal ideas. The patient is not nervous/anxious.     Today's Vitals   10/20/17 1513  BP: 109/63  Pulse: 76  Resp: 16  SpO2: 98%  Weight: 250 lb (113.4 kg)  Height: 5\' 3"  (1.6 m)     Physical Exam  Constitutional: She is oriented to person, place, and time. She appears well-developed and well-nourished. No distress.  HENT:  Head: Normocephalic and atraumatic.  Nose: Nose normal.  Mouth/Throat: Oropharynx is clear and moist. No oropharyngeal exudate.  Eyes: Pupils are equal, round, and reactive to light. Conjunctivae and EOM are normal.  Neck: Normal range of motion. Neck supple. No JVD present. Carotid bruit is  not present. No tracheal deviation present. No thyromegaly present.  Cardiovascular: Normal rate, regular rhythm and normal heart sounds. Exam reveals no gallop and no friction rub.  No murmur heard. Pulmonary/Chest: Effort normal and breath sounds normal. No respiratory distress. She has no wheezes. She has no rales. She exhibits no tenderness.  Abdominal: Soft. Bowel sounds are normal. There is no tenderness.  Musculoskeletal: Normal range of motion.  Lymphadenopathy:    She has no cervical adenopathy.  Neurological: She is alert and oriented to person, place, and time. No cranial nerve deficit.  Skin: Skin is warm and dry. Capillary refill takes less than 2 seconds. She is not diaphoretic.  Psychiatric: She has a normal mood and affect. Her behavior is normal. Judgment and thought content normal.  Nursing note and vitals reviewed.  Assessment/Plan: 1. Hypertension, unspecified type Blood pressure well controlled. Continue as prescribed. Refills provided today. -  losartan-hydrochlorothiazide (HYZAAR) 100-25 MG tablet; Take 1 tablet by mouth daily with lunch.  Dispense: 30 tablet; Refill: 5 - carvedilol (COREG) 25 MG tablet; Take 1 tablet (25 mg total) by mouth daily with lunch.  Dispense: 60 tablet; Refill: 5  2. Peripheral edema Continue to take furosemide as needed and as prescribed.  - furosemide (LASIX) 20 MG tablet; Take 1 tablet (20 mg total) by mouth every evening.  Dispense: 30 tablet; Refill: 5  3. Hypokalemia Continue poassium supplement as prescribed.  - potassium chloride SA (K-DUR,KLOR-CON) 20 MEQ tablet; Take 1 tablet (20 mEq total) by mouth daily.  Dispense: 30 tablet; Refill: 5  4. Mixed hyperlipidemia - lovastatin (MEVACOR) 40 MG tablet; Take 1 tablet (40 mg total) by mouth at bedtime.  Dispense: 30 tablet; Refill: 3  5. Screening for breast cancer - MM DIGITAL SCREENING BILATERAL; Future   General Counseling: Tiffany Schaefer verbalizes understanding of the findings of todays visit and agrees with plan of treatment. I have discussed any further diagnostic evaluation that may be needed or ordered today. We also reviewed her medications today. she has been encouraged to call the office with any questions or concerns that should arise related to todays visit.    Counseling:  Hypertension Counseling:   The following hypertensive lifestyle modification were recommended and discussed:  1. Limiting alcohol intake to less than 1 oz/day of ethanol:(24 oz of beer or 8 oz of wine or 2 oz of 100-proof whiskey). 2. Take baby ASA 81 mg daily. 3. Importance of regular aerobic exercise and losing weight. 4. Reduce dietary saturated fat and cholesterol intake for overall cardiovascular health. 5. Maintaining adequate dietary potassium, calcium, and magnesium intake. 6. Regular monitoring of the blood pressure. 7. Reduce sodium intake to less than 100 mmol/day (less than 2.3 gm of sodium or less than 6 gm of sodium choride)   This patient was seen by  Leretha Pol, FNP- C in Collaboration with Dr Lavera Guise as a part of collaborative care agreement  Orders Placed This Encounter  Procedures  . MM DIGITAL SCREENING BILATERAL    Meds ordered this encounter  Medications  . potassium chloride SA (K-DUR,KLOR-CON) 20 MEQ tablet    Sig: Take 1 tablet (20 mEq total) by mouth daily.    Dispense:  30 tablet    Refill:  5    This prescription was filled on 06/20/2017. Any refills authorized will be placed on file.    Order Specific Question:   Supervising Provider    Answer:   Lavera Guise [[id number]]  . furosemide (LASIX) 20 MG  tablet    Sig: Take 1 tablet (20 mg total) by mouth every evening.    Dispense:  30 tablet    Refill:  5    Order Specific Question:   Supervising Provider    Answer:   Lavera Guise [[id number]]  . losartan-hydrochlorothiazide (HYZAAR) 100-25 MG tablet    Sig: Take 1 tablet by mouth daily with lunch.    Dispense:  30 tablet    Refill:  5    Order Specific Question:   Supervising Provider    Answer:   Lavera Guise [[id number]]  . lovastatin (MEVACOR) 40 MG tablet    Sig: Take 1 tablet (40 mg total) by mouth at bedtime.    Dispense:  30 tablet    Refill:  3    Order Specific Question:   Supervising Provider    Answer:   Lavera Guise [[id number]]  . carvedilol (COREG) 25 MG tablet    Sig: Take 1 tablet (25 mg total) by mouth daily with lunch.    Dispense:  60 tablet    Refill:  5    Order Specific Question:   Supervising Provider    Answer:   Lavera Guise [[id number]]    Time spent: 32 Minutes      Dr Lavera Guise Internal medicine

## 2017-10-21 ENCOUNTER — Telehealth: Payer: Self-pay

## 2017-10-21 ENCOUNTER — Ambulatory Visit (INDEPENDENT_AMBULATORY_CARE_PROVIDER_SITE_OTHER): Payer: 59 | Admitting: Obstetrics and Gynecology

## 2017-10-21 ENCOUNTER — Encounter: Payer: Self-pay | Admitting: Obstetrics and Gynecology

## 2017-10-21 VITALS — BP 110/66 | HR 104 | Wt 249.0 lb

## 2017-10-21 DIAGNOSIS — Z4889 Encounter for other specified surgical aftercare: Secondary | ICD-10-CM

## 2017-10-21 NOTE — Patient Instructions (Signed)
[facility] [address] [place] [address]  [facility]  [address] [place] [address]  Phone: [phone]  

## 2017-10-21 NOTE — Telephone Encounter (Signed)
Called pt to inform her that her mammogram orders are in the system and she can call [facility] and make appt at her earliest convenience.

## 2017-10-21 NOTE — Progress Notes (Signed)
      Postoperative Follow-up Patient presents post op from hysteroscopy D&C 6weeks ago for endometrial polyp, PMB.  Subjective: Patient reports marked improvement in her preop symptoms. Eating a regular diet without difficulty. The patient is not having any pain.  Activity: normal activities of daily living. No further bleeding  Objective: Blood pressure 110/66, pulse (!) 104, weight 249 lb (112.9 kg).  Gen: NAD HEENT: normocephalic, anicteric Plulmonary: no increased work of breathing Genitourinary:  External: Normal external female genitalia.  Normal urethral meatus, normal Bartholin's and Skene's glands.    Vagina: Normal vaginal mucosa, no evidence of prolapse.    Cervix: Grossly normal in appearance, no bleeding  Uterus: Non-enlarged, mobile, normal contour.  No CMT  Adnexa: ovaries non-enlarged, no adnexal masses  Rectal: deferred  Lymphatic: no evidence of inguinal lymphadenopathy Neurologic: grossly intact   Admission on 09/11/2017, Discharged on 09/11/2017  Component Date Value Ref Range Status  . Hemoglobin 09/11/2017 13.0  12.0 - 16.0 g/dL Final   Performed at [facility], [address] [place], [place] [address]  . SURGICAL PATHOLOGY 09/11/2017    Final                   Value:Surgical Pathology CASE: [id number] PATIENT: Tiffany Schaefer Surgical Pathology Report     SPECIMEN SUBMITTED: A. Endometrial polyp and curettings  CLINICAL HISTORY: None provided  PRE-OPERATIVE DIAGNOSIS: Endometrial polyp, post menopausal bleeding  POST-OPERATIVE DIAGNOSIS: None provided.     DIAGNOSIS: A.  ENDOMETRIAL POLYP AND CURETTINGS; DILATATION AND CURETTAGE: - FRAGMENTS OF BENIGN ENDOMETRIAL POLYP. - NEGATIVE FOR ATYPIA AND MALIGNANCY.   GROSS DESCRIPTION: A. Labeled: Endometrial polyp and curettings Received: In formalin Tissue fragment(s): Multiple Size: Aggregate, 2.5 x 1.5 x 0.1 cm Description: Rubbery tan-brown fragments Entirely submitted  in one cassette.    Final Diagnosis performed by Delorse Lek, MD.   Electronically signed 09/15/2017 10:47:00AM The electronic signature indicates that the named Attending Pathologist has evaluated the specimen  Technical component performed at [facility], [address] [place], [place] [address] Lab: 8                         [phone] Dir: Rush Farmer, MD, MMM  Professional component performed at [facility], [facility], [place], [place], [place] [address] Lab: [phone] Dir: Dellia Nims. Reuel Derby, MD     Assessment: 67 y.o. s/p hysteroscopy, D&C stable  Plan: Patient has done well after surgery with no apparent complications.  I have discussed the post-operative course to date, and the expected progress moving forward.  The patient understands what complications to be concerned about.  I will see the patient in routine follow up, or sooner if needed.    Activity plan: No restriction.   Malachy Mood, MD, [facility], [facility] 10/21/2017, 10:22 AM

## 2017-11-09 DIAGNOSIS — Z1239 Encounter for other screening for malignant neoplasm of breast: Secondary | ICD-10-CM | POA: Insufficient documentation

## 2018-02-20 ENCOUNTER — Encounter: Payer: Self-pay | Admitting: Nurse Practitioner

## 2018-02-20 ENCOUNTER — Ambulatory Visit (INDEPENDENT_AMBULATORY_CARE_PROVIDER_SITE_OTHER): Payer: Medicare Other | Admitting: Nurse Practitioner

## 2018-02-20 VITALS — BP 122/67 | HR 108 | Resp 16 | Ht 63.0 in | Wt 245.0 lb

## 2018-02-20 DIAGNOSIS — F17210 Nicotine dependence, cigarettes, uncomplicated: Secondary | ICD-10-CM | POA: Diagnosis not present

## 2018-02-20 DIAGNOSIS — E785 Hyperlipidemia, unspecified: Secondary | ICD-10-CM

## 2018-02-20 DIAGNOSIS — R300 Dysuria: Secondary | ICD-10-CM | POA: Diagnosis not present

## 2018-02-20 DIAGNOSIS — Z1239 Encounter for other screening for malignant neoplasm of breast: Secondary | ICD-10-CM | POA: Diagnosis not present

## 2018-02-20 DIAGNOSIS — I10 Essential (primary) hypertension: Secondary | ICD-10-CM

## 2018-02-20 DIAGNOSIS — J069 Acute upper respiratory infection, unspecified: Secondary | ICD-10-CM

## 2018-02-20 DIAGNOSIS — Z0000 Encounter for general adult medical examination without abnormal findings: Secondary | ICD-10-CM

## 2018-02-20 MED ORDER — SULFAMETHOXAZOLE-TRIMETHOPRIM 800-160 MG PO TABS: 1.0000 | ORAL_TABLET | Freq: Two times a day (BID) | ORAL | 0 refills | Status: DC

## 2018-02-20 MED ORDER — LOSARTAN POTASSIUM-HCTZ 100-25 MG PO TABS: 1.0000 | ORAL_TABLET | Freq: Every day | ORAL | 5 refills | Status: DC

## 2018-02-20 NOTE — Progress Notes (Signed)
[facility] [place], [place] [address]  Internal MEDICINE  Office Visit Note  Patient Name: Tiffany Schaefer  [id number]  [id number]  Date of Service: 02/20/2018   Pt is here for routine health maintenance examination  Chief Complaint  Patient presents with  . Annual Exam  . Hypertension  . Hyperlipidemia  . Quality Metric Gaps    colonoscopy     Patient arrives for annual medicare wellness visit. Patient reports cold symptoms such as strong nonproductive cough, congestion, and stuffy nose. The first symptoms started about 3 weeks ago. Patient tried Mucinex, Delsym, and Sudafed with moderate relief of symptoms. Patient denies any fever, chills, sore throat, headache, earache, chest pain, palpitation or shortness of breath. However, patient reports some wheezing in the evening in the past 3 weeks and she uses Albuterol inhaler to relieve her symptoms. She has not been using her inhaler for the past 3 days due to feeling better. Patient reports some stiffness in her right knee that goes away with mobility. Denies any pain related to it.  Patient has occasional rash on her right ankle. She uses Kenalog 0.5%, prescribed during her last visit that she continues to use with positive results. Patient denies any rash at this time. Patient does not have any other concerns or questions during this visit.    Current Medication: Outpatient Encounter Medications as of 02/20/2018  Medication Sig  . albuterol (PROVENTIL HFA;VENTOLIN HFA) 108 (90 Base) MCG/ACT inhaler Inhale 2 puffs into the lungs every 6 (six) hours as needed for wheezing or shortness of breath.  Marland Kitchen aspirin EC 81 MG tablet Take 81 mg by mouth daily with lunch.   . carvedilol (COREG) 25 MG tablet Take 1 tablet (25 mg total) by mouth daily with lunch.  . furosemide (LASIX) 20 MG tablet Take 1 tablet (20 mg total) by mouth every evening.  Marland Kitchen losartan-hydrochlorothiazide (HYZAAR) 100-25 MG tablet Take 1 tablet by  mouth daily with lunch.  . lovastatin (MEVACOR) 40 MG tablet Take 1 tablet (40 mg total) by mouth at bedtime.  . naproxen sodium (ALEVE) 220 MG tablet Take 440 mg by mouth daily as needed (pain).  . potassium chloride SA (K-DUR,KLOR-CON) 20 MEQ tablet Take 1 tablet (20 mEq total) by mouth daily.  Marland Kitchen triamcinolone cream (KENALOG) 0.5 % APPLY TO AFFECTED AREA TWICE DAILY FOR RASH  . [DISCONTINUED] losartan-hydrochlorothiazide (HYZAAR) 100-25 MG tablet Take 1 tablet by mouth daily with lunch.  . sulfamethoxazole-trimethoprim (BACTRIM DS,SEPTRA DS) 800-160 MG tablet Take 1 tablet by mouth 2 (two) times daily.   No facility-administered encounter medications on file as of 02/20/2018.     Surgical History: Past Surgical History:  Procedure Laterality Date  . CESAREAN SECTION     denies 09/03/17  . COLONOSCOPY    . HEMORRHOID SURGERY    . HYSTEROSCOPY W/D&C N/A 09/11/2017   Procedure: DILATATION AND CURETTAGE /HYSTEROSCOPY;  Surgeon: Malachy Mood, MD;  Location: [facility] ORS;  Service: Gynecology;  Laterality: N/A;    Medical History: Past Medical History:  Diagnosis Date  . Arthritis   . Hyperlipidemia   . Hypertension   . Sinus infection   . Sleep apnea     Family History: Family History  Problem Relation Age of Onset  . Stroke Mother   . Diabetes Brother   . Rheumatic fever Brother   . Arthritis/Rheumatoid Brother       Review of Systems  Constitutional: Positive for fatigue. Negative for activity change, appetite change, chills,  fever and unexpected weight change.  HENT: Positive for congestion and rhinorrhea. Negative for ear discharge, ear pain, facial swelling, hearing loss, sinus pressure, sinus pain, sneezing, sore throat, tinnitus and voice change.   Eyes: Negative.  Negative for pain, discharge, redness and itching.  Respiratory: Positive for cough and wheezing. Negative for apnea, chest tightness and shortness of breath.        Patient has strong nonproductive cough,  no wheezing at this time  Cardiovascular: Negative for chest pain, palpitations and leg swelling.  Gastrointestinal: Negative for abdominal distention, abdominal pain, blood in stool, constipation, diarrhea and nausea.  Endocrine: Negative for cold intolerance, heat intolerance, polydipsia, polyphagia and polyuria.  Genitourinary: Negative.  Negative for dysuria, flank pain, frequency, hematuria, pelvic pain, urgency and vaginal discharge.  Musculoskeletal: Positive for arthralgias. Negative for back pain and myalgias.       Occasional right knee stiffness   Skin: Negative for color change, pallor and rash.  Allergic/Immunologic: Positive for environmental allergies.       Patient plans to pick up allergy medication to help with her symptoms later in the day  Neurological: Negative for dizziness, syncope, facial asymmetry, speech difficulty, weakness, light-headedness, numbness and headaches.  Hematological: Negative for adenopathy.  Psychiatric/Behavioral: Negative for behavioral problems and dysphoric mood.    Today's Vitals   02/20/18 1108  BP: 122/67  Pulse: (!) 108  Resp: 16  SpO2: 97%  Weight: 245 lb (111.1 kg)  Height: 5\' 3"  (1.6 m)    Physical Exam  Constitutional: She is oriented to person, place, and time. She appears well-developed and well-nourished.  HENT:  Head: Normocephalic and atraumatic.  Right Ear: External ear normal.  Left Ear: External ear normal.  Nose: Nose normal.  Mouth/Throat: Oropharynx is clear and moist. No oropharyngeal exudate.  Eyes: Pupils are equal, round, and reactive to light. Conjunctivae and EOM are normal. Right eye exhibits no discharge. Left eye exhibits no discharge. No scleral icterus.  Neck: Normal range of motion. Neck supple. No JVD present. No tracheal deviation present. No thyromegaly present.  Cardiovascular: Regular rhythm and normal heart sounds. Exam reveals no friction rub.  No murmur heard. Slightly tachycardic 108,  asymptomatic, patient smoked right before the visit  Pulmonary/Chest: Effort normal and breath sounds normal. No stridor. No respiratory distress. She exhibits no mass, no tenderness, no laceration, no crepitus, no edema, no deformity, no swelling and no retraction. Right breast exhibits no inverted nipple, no mass, no nipple discharge, no skin change and no tenderness. Left breast exhibits no inverted nipple, no mass, no nipple discharge, no skin change and no tenderness. No breast swelling, tenderness, discharge or bleeding. Breasts are symmetrical.  Congested, harsh sounding cough. Non-productive   Abdominal: Soft. Bowel sounds are normal. She exhibits no distension and no mass. There is no tenderness. There is no guarding.  Musculoskeletal: Normal range of motion. She exhibits no edema, tenderness or deformity.  Lymphadenopathy:    She has no cervical adenopathy.  Neurological: She is alert and oriented to person, place, and time.  Skin: Skin is warm and dry. Capillary refill takes less than 2 seconds.  Psychiatric: She has a normal mood and affect. Her behavior is normal. Judgment and thought content normal.  Nursing note and vitals reviewed.  Depression screen [facility] 2/9 02/20/2018 06/10/2017 04/21/2017  Decreased Interest 0 0 0  Down, Depressed, Hopeless 0 0 0  PHQ - 2 Score 0 0 0  Altered sleeping - - 0  Tired, decreased energy - -  1  Change in appetite - - 0  Feeling bad or failure about yourself  - - 0  Trouble concentrating - - 0  Moving slowly or fidgety/restless - - 0  Suicidal thoughts - - 0  PHQ-9 Score - - 1  Difficult doing work/chores - - Not difficult at all    Functional Status Survey: Is the patient deaf or have difficulty hearing?: Yes(ringing in the ears) Does the patient have difficulty seeing, even when wearing glasses/contacts?: No Does the patient have difficulty concentrating, remembering, or making decisions?: No Does the patient have difficulty walking or  climbing stairs?: Yes(climbing stairs) Does the patient have difficulty dressing or bathing?: No Does the patient have difficulty doing errands alone such as visiting a doctor's office or shopping?: No  MMSE - [place] Exam 02/20/2018  Orientation to time 5  Orientation to Place 5  Registration 3  Attention/ Calculation 5  Recall 3  Language- name 2 objects 2  Language- repeat 1  Language- follow 3 step command 3  Language- read & follow direction 1  Write a sentence 1  Copy design 1  Total score 30    Fall Risk  02/20/2018 06/10/2017  Falls in the past year? No No   Assessment/Plan:  1. Medicare annual  wellness visit, initial Full assessment and regular labs are ordered CBC,BMT, lipid panel, thyroid panel and vit. D to complete wellness visit  2. Hypertension, unspecified type Stable blood pressure continue with current treatment - losartan-hydrochlorothiazide (HYZAAR) 100-25 MG tablet; Take 1 tablet by mouth daily with lunch.  Dispense: 30 tablet; Refill: 5  3. Acute upper respiratory infection Cough an rhinorrhea for the past 3 weeks - sulfamethoxazole-trimethoprim (BACTRIM DS,SEPTRA DS) 800-160 MG tablet; Take 1 tablet by mouth 2 (two) times daily.  Dispense: 20 tablet; Refill: 0  4. Screening for breast cancer Breast exam performed with no abnormal findings  5. Hyperlipidemia, unspecified hyperlipidemia type Lipid panel ordered to assess patient's condition  6. Dysuria Annual routine urianalysis - UA/M w/rflx Culture, Routine  7. cigarette smoker Smoking cessation discussed with patient.    General Counseling: Lynsie verbalizes understanding of the findings of todays visit and agrees with plan of treatment. I have discussed any further diagnostic evaluation that may be needed or ordered today. We also reviewed her medications today. she has been encouraged to call the office with any questions or concerns that should arise related to todays  visit.    Counseling:  Hypertension Counseling:   The following hypertensive lifestyle modification were recommended and discussed:  1. Limiting alcohol intake to less than 1 oz/day of ethanol:(24 oz of beer or 8 oz of wine or 2 oz of 100-proof whiskey). 2. Take baby ASA 81 mg daily. 3. Importance of regular aerobic exercise and losing weight. 4. Reduce dietary saturated fat and cholesterol intake for overall cardiovascular health. 5. Maintaining adequate dietary potassium, calcium, and magnesium intake. 6. Regular monitoring of the blood pressure. 7. Reduce sodium intake to less than 100 mmol/day (less than 2.3 gm of sodium or less than 6 gm of sodium choride)   Patient is educated about risks of smoking. Patient strongly encouraged to stop smoking or attempt to decrease the use of cigarettes slowly over period of time. Patient informed about medication that might help with this transition.  This patient was seen by Leretha Pol FNP Collaboration with Dr Lavera Guise as a part of collaborative care agreement  Orders Placed This Encounter  Procedures  .  UA/M w/rflx Culture, Routine    Meds ordered this encounter  Medications  . sulfamethoxazole-trimethoprim (BACTRIM DS,SEPTRA DS) 800-160 MG tablet    Sig: Take 1 tablet by mouth 2 (two) times daily.    Dispense:  20 tablet    Refill:  0    Order Specific Question:   Supervising Provider    Answer:   Lavera Guise [[id number]]  . losartan-hydrochlorothiazide (HYZAAR) 100-25 MG tablet    Sig: Take 1 tablet by mouth daily with lunch.    Dispense:  30 tablet    Refill:  5    Order Specific Question:   Supervising Provider    Answer:   Lavera Guise [[id number]]    Time spent: [place], MD  Internal Medicine

## 2018-02-21 LAB — UA/M W/RFLX CULTURE, ROUTINE
BILIRUBIN UA: NEGATIVE
Glucose, UA: NEGATIVE
Ketones, UA: NEGATIVE
Leukocytes, UA: NEGATIVE
Nitrite, UA: NEGATIVE
PROTEIN UA: NEGATIVE
RBC, UA: NEGATIVE
SPEC GRAV UA: 1.024 (ref 1.005–1.030)
Urobilinogen, Ur: 1 mg/dL (ref 0.2–1.0)
pH, UA: 6 (ref 5.0–7.5)

## 2018-02-21 LAB — MICROSCOPIC EXAMINATION: Casts: NONE SEEN /lpf

## 2018-03-06 ENCOUNTER — Ambulatory Visit: Payer: Medicare Other | Admitting: Adult Health

## 2018-03-06 ENCOUNTER — Telehealth: Payer: Self-pay | Admitting: Adult Health

## 2018-03-06 ENCOUNTER — Ambulatory Visit
Admission: RE | Admit: 2018-03-06 | Discharge: 2018-03-06 | Disposition: A | Discharge status: Home / Self Care | Payer: Medicare Other | Source: Ambulatory Visit | Attending: Adult Health | Admitting: Adult Health

## 2018-03-06 ENCOUNTER — Encounter: Payer: Self-pay | Admitting: Adult Health

## 2018-03-06 VITALS — BP 114/73 | HR 102 | Temp 98.5°F | Resp 16 | Ht 63.0 in | Wt 235.0 lb

## 2018-03-06 DIAGNOSIS — K5900 Constipation, unspecified: Secondary | ICD-10-CM | POA: Diagnosis not present

## 2018-03-06 DIAGNOSIS — R1010 Upper abdominal pain, unspecified: Secondary | ICD-10-CM | POA: Diagnosis not present

## 2018-03-06 NOTE — Telephone Encounter (Signed)
Patient advised on results and to take medication that adam had gave samples of , continue with the liquid diet , if no relief by Monday to please let us know

## 2018-03-06 NOTE — Progress Notes (Signed)
Tiffany Schaefer [place], [place] [address]  Internal MEDICINE  Office Visit Note  Patient Name: Tiffany Schaefer  [id number]  [id number]  Date of Service: 03/06/2018  Chief Complaint  Patient presents with  . Fecal Impaction    stomach pain, back pain, can not eat . vomitting , took dulcolax it worked one day but not able to go.     HPI Pt is here for a sick visit.  Patient reports that she has been constipated for the last for 5 days.  She took Dulcolax on Tuesday and was able to have what she describes as a large bowel movement on Wednesday.  However for the last 2 days she has not been able to go at all and has had upper abdominal pain bilaterally, back pain, and reports she has been unable to eat.  She has been drinking some chicken broth which causes her pain but does not make her vomit.  She reports vomiting one time and describes it as a very small amount and clear.  Patient reports she has about 2 instances of this constipation each year.    Current Medication:  Outpatient Encounter Medications as of 03/06/2018  Medication Sig  . albuterol (PROVENTIL HFA;VENTOLIN HFA) 108 (90 Base) MCG/ACT inhaler Inhale 2 puffs into the lungs every 6 (six) hours as needed for wheezing or shortness of breath.  Marland Kitchen aspirin EC 81 MG tablet Take 81 mg by mouth daily with lunch.   . bisacodyl (DULCOLAX) 5 MG EC tablet Take 5 mg by mouth daily as needed for moderate constipation.  . carvedilol (COREG) 25 MG tablet Take 1 tablet (25 mg total) by mouth daily with lunch.  . furosemide (LASIX) 20 MG tablet Take 1 tablet (20 mg total) by mouth every evening.  Marland Kitchen losartan-hydrochlorothiazide (HYZAAR) 100-25 MG tablet Take 1 tablet by mouth daily with lunch.  . lovastatin (MEVACOR) 40 MG tablet Take 1 tablet (40 mg total) by mouth at bedtime.  . naproxen sodium (ALEVE) 220 MG tablet Take 440 mg by mouth daily as needed (pain).  . potassium chloride SA (K-DUR,KLOR-CON) 20 MEQ tablet Take 1  tablet (20 mEq total) by mouth daily.  Marland Kitchen sulfamethoxazole-trimethoprim (BACTRIM DS,SEPTRA DS) 800-160 MG tablet Take 1 tablet by mouth 2 (two) times daily.  Marland Kitchen triamcinolone cream (KENALOG) 0.5 % APPLY TO AFFECTED AREA TWICE DAILY FOR RASH   No facility-administered encounter medications on file as of 03/06/2018.       Medical History: Past Medical History:  Diagnosis Date  . Arthritis   . Hyperlipidemia   . Hypertension   . Sinus infection   . Sleep apnea      Vital Signs: BP 114/73 (BP Location: Left Arm, Patient Position: Sitting, Cuff Size: Normal)   Pulse (!) 102   Temp 98.5 F (36.9 C) (Oral)   Resp 16   Ht 5\' 3"  (1.6 m)   Wt 235 lb (106.6 kg)   SpO2 98%   BMI 41.63 kg/m    Review of Systems  Constitutional: Negative for chills, fatigue and unexpected weight change.  HENT: Negative for congestion, rhinorrhea, sneezing and sore throat.   Eyes: Negative for photophobia, pain and redness.  Respiratory: Negative for cough, chest tightness and shortness of breath.   Cardiovascular: Negative for chest pain and palpitations.  Gastrointestinal: Positive for abdominal pain, constipation, nausea and vomiting. Negative for diarrhea.  Endocrine: Negative.   Genitourinary: Negative for dysuria and frequency.  Musculoskeletal: Negative for arthralgias, back pain,  joint swelling and neck pain.  Skin: Negative for rash.  Allergic/Immunologic: Negative.   Neurological: Negative for tremors and numbness.  Hematological: Negative for adenopathy. Does not bruise/bleed easily.  Psychiatric/Behavioral: Negative for behavioral problems and sleep disturbance. The patient is not nervous/anxious.     Physical Exam  Constitutional: She is oriented to person, place, and time. She appears well-developed and well-nourished. No distress.  HENT:  Head: Normocephalic and atraumatic.  Mouth/Throat: Oropharynx is clear and moist. No oropharyngeal exudate.  Eyes: Pupils are equal, round, and  reactive to light. EOM are normal.  Neck: Normal range of motion. Neck supple. No JVD present. No tracheal deviation present. No thyromegaly present.  Cardiovascular: Normal rate, regular rhythm and normal heart sounds. Exam reveals no gallop and no friction rub.  No murmur heard. Pulmonary/Chest: Effort normal and breath sounds normal. No respiratory distress. She has no wheezes. She has no rales. She exhibits no tenderness.  Abdominal: Soft. Bowel sounds are normal. She exhibits no distension and no mass. There is tenderness. There is no rebound and no guarding. No hernia.  Musculoskeletal: Normal range of motion.  Lymphadenopathy:    She has no cervical adenopathy.  Neurological: She is alert and oriented to person, place, and time. No cranial nerve deficit.  Skin: Skin is warm and dry. She is not diaphoretic.  Psychiatric: She has a normal mood and affect. Her behavior is normal. Judgment and thought content normal.  Nursing note and vitals reviewed.   Assessment/Plan: 1. Constipation, unspecified constipation type Patient given sample of 175 mcg Linzess, as well as MiraLAX.  We discussed signs and symptoms would be concerning, and that she should go to the emergency department for.  We did discuss fleets enemas, patient reports she has never used an enema and does not feel comfortable doing so.  We will get a KUB to evaluate for obstruction, and to gauge the amount of stool present.   - DG Abd 1 View; Future  2. Pain of upper abdomen - DG Abd 1 View; Future Pt's X-ray shows: Moderate stool in colon. No bowel obstruction or free air evident. 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 Advised patient to take Linzess and MiraLAX as we discussed.  She should follow-up in the office on Monday if no relief.  General Counseling: Tiffany Schaefer verbalizes understanding of the findings of todays visit and agrees with plan of  treatment. I have discussed any further diagnostic evaluation that may be needed or ordered today. We also reviewed her medications today. she has been encouraged to call the office with any questions or concerns that should arise related to todays visit.   Orders Placed This Encounter  Procedures  . DG Abd 1 View    No orders of the defined types were placed in this encounter.   Time spent:25 Minutes  This patient was seen by Orson Gear AGNP-C in Collaboration with Dr Lavera Guise as a part of collaborative care agreement.  Kendell Bane AGNP-C Internal Medicine

## 2018-03-26 ENCOUNTER — Other Ambulatory Visit: Payer: Self-pay | Admitting: Internal Medicine

## 2018-03-26 DIAGNOSIS — R609 Edema, unspecified: Secondary | ICD-10-CM

## 2018-04-16 ENCOUNTER — Other Ambulatory Visit: Payer: Self-pay

## 2018-04-16 DIAGNOSIS — E782 Mixed hyperlipidemia: Secondary | ICD-10-CM

## 2018-04-16 MED ORDER — LOVASTATIN 40 MG PO TABS: 40.0000 mg | ORAL_TABLET | Freq: Every day | ORAL | 3 refills | Status: DC

## 2018-04-23 ENCOUNTER — Other Ambulatory Visit: Payer: Self-pay | Admitting: Nurse Practitioner

## 2018-04-23 DIAGNOSIS — I10 Essential (primary) hypertension: Secondary | ICD-10-CM | POA: Diagnosis not present

## 2018-04-23 DIAGNOSIS — E782 Mixed hyperlipidemia: Secondary | ICD-10-CM | POA: Diagnosis not present

## 2018-04-23 DIAGNOSIS — Z0001 Encounter for general adult medical examination with abnormal findings: Secondary | ICD-10-CM | POA: Diagnosis not present

## 2018-04-23 DIAGNOSIS — E559 Vitamin D deficiency, unspecified: Secondary | ICD-10-CM | POA: Diagnosis not present

## 2018-04-24 LAB — CBC
Hematocrit: 39.6 % (ref 34.0–46.6)
Hemoglobin: 12.9 g/dL (ref 11.1–15.9)
MCH: 27.6 pg (ref 26.6–33.0)
MCHC: 32.6 g/dL (ref 31.5–35.7)
MCV: 85 fL (ref 79–97)
PLATELETS: 301 10*3/uL (ref 150–450)
RBC: 4.68 x10E6/uL (ref 3.77–5.28)
RDW: 15.5 % — AB (ref 12.3–15.4)
WBC: 7.9 10*3/uL (ref 3.4–10.8)

## 2018-04-24 LAB — T3: T3 TOTAL: 113 ng/dL (ref 71–180)

## 2018-04-24 LAB — VITAMIN D 25 HYDROXY (VIT D DEFICIENCY, FRACTURES): Vit D, 25-Hydroxy: 17.5 ng/mL — ABNORMAL LOW (ref 30.0–100.0)

## 2018-04-24 LAB — COMPREHENSIVE METABOLIC PANEL
A/G RATIO: 1 — AB (ref 1.2–2.2)
ALT: 9 IU/L (ref 0–32)
AST: 14 IU/L (ref 0–40)
Albumin: 3.7 g/dL (ref 3.6–4.8)
Alkaline Phosphatase: 43 IU/L (ref 39–117)
BILIRUBIN TOTAL: 0.3 mg/dL (ref 0.0–1.2)
BUN / CREAT RATIO: 16 (ref 12–28)
BUN: 15 mg/dL (ref 8–27)
CALCIUM: 9.1 mg/dL (ref 8.7–10.3)
CHLORIDE: 97 mmol/L (ref 96–106)
CO2: 26 mmol/L (ref 20–29)
Creatinine, Ser: 0.92 mg/dL (ref 0.57–1.00)
GFR, EST AFRICAN AMERICAN: 76 mL/min/{1.73_m2} (ref >59)
GFR, EST NON AFRICAN AMERICAN: 66 mL/min/{1.73_m2} (ref >59)
Globulin, Total: 3.8 g/dL (ref 1.5–4.5)
Glucose: 94 mg/dL (ref 65–99)
POTASSIUM: 3.4 mmol/L — AB (ref 3.5–5.2)
Sodium: 140 mmol/L (ref 134–144)
TOTAL PROTEIN: 7.5 g/dL (ref 6.0–8.5)

## 2018-04-24 LAB — TSH: TSH: 1.41 u[IU]/mL (ref 0.450–4.500)

## 2018-04-24 LAB — LIPID PANEL W/O CHOL/HDL RATIO
CHOLESTEROL TOTAL: 185 mg/dL (ref 100–199)
HDL: 43 mg/dL (ref >39)
LDL CALC: 122 mg/dL — AB (ref 0–99)
TRIGLYCERIDES: 99 mg/dL (ref 0–149)
VLDL Cholesterol Cal: 20 mg/dL (ref 5–40)

## 2018-04-24 LAB — T4, FREE: Free T4: 1.1 ng/dL (ref 0.82–1.77)

## 2018-04-27 ENCOUNTER — Telehealth: Payer: Self-pay | Admitting: Nurse Practitioner

## 2018-04-27 ENCOUNTER — Other Ambulatory Visit: Payer: Self-pay

## 2018-04-27 ENCOUNTER — Other Ambulatory Visit: Payer: Self-pay | Admitting: Nurse Practitioner

## 2018-04-27 DIAGNOSIS — E559 Vitamin D deficiency, unspecified: Secondary | ICD-10-CM

## 2018-04-27 MED ORDER — ERGOCALCIFEROL 1.25 MG (50000 UT) PO CAPS: 50000.0000 [IU] | ORAL_CAPSULE | ORAL | 5 refills | Status: DC

## 2018-04-27 NOTE — Telephone Encounter (Signed)
-----   Message from Ronnell Freshwater, NP sent at 04/27/2018  8:13 AM EST ----- Please let the patient know that on recent labs showed Vitamin d deficiency. Add drisdol once weekly for next 6 months. Potassium level 3.4. continue oral potassium supplement as prescribed. Other labs were good. Thanks. Also, drisdol was sent to [facility] .

## 2018-04-27 NOTE — Telephone Encounter (Signed)
CALLED PT TO NOTIFY HER THAT HER POTASSIUM LEVEL IS 3.4 AND TO CONTINUE POTASSIUM SUPPLEMENT & THAT HER VITAMIN D IS LOW AND DRISDOL WAS SENT IN TO TAKE ONCE WEEKLY. PT NOTIFIED ME THAT SHE PICKED UP DRISDOL.

## 2018-04-27 NOTE — Progress Notes (Signed)
Vitamin d deficiency on labs. Add drisdol once weekly for next 6 months. Potassium level 3.4. continue oral potassium supplement as prescribed. Other labs were good.

## 2018-06-05 ENCOUNTER — Other Ambulatory Visit: Payer: Self-pay

## 2018-06-05 DIAGNOSIS — R609 Edema, unspecified: Secondary | ICD-10-CM

## 2018-06-05 MED ORDER — FUROSEMIDE 20 MG PO TABS: 20.0000 mg | ORAL_TABLET | Freq: Every day | ORAL | 1 refills | Status: DC

## 2018-06-08 ENCOUNTER — Other Ambulatory Visit: Payer: Self-pay

## 2018-06-08 DIAGNOSIS — R609 Edema, unspecified: Secondary | ICD-10-CM

## 2018-06-08 MED ORDER — FUROSEMIDE 20 MG PO TABS: 20.0000 mg | ORAL_TABLET | Freq: Every day | ORAL | 1 refills | Status: DC

## 2018-06-09 ENCOUNTER — Other Ambulatory Visit: Payer: Self-pay

## 2018-06-09 DIAGNOSIS — E782 Mixed hyperlipidemia: Secondary | ICD-10-CM

## 2018-06-09 MED ORDER — LOVASTATIN 40 MG PO TABS: 40.0000 mg | ORAL_TABLET | Freq: Every day | ORAL | 1 refills | Status: DC

## 2018-08-17 IMAGING — CR DG CHEST 2V
2 series · 2 of 2 positions shown · non-contrast
Comparison: None.

CLINICAL DATA: Syncopal episode.

EXAM:
CHEST  2 VIEW

[chest pa]
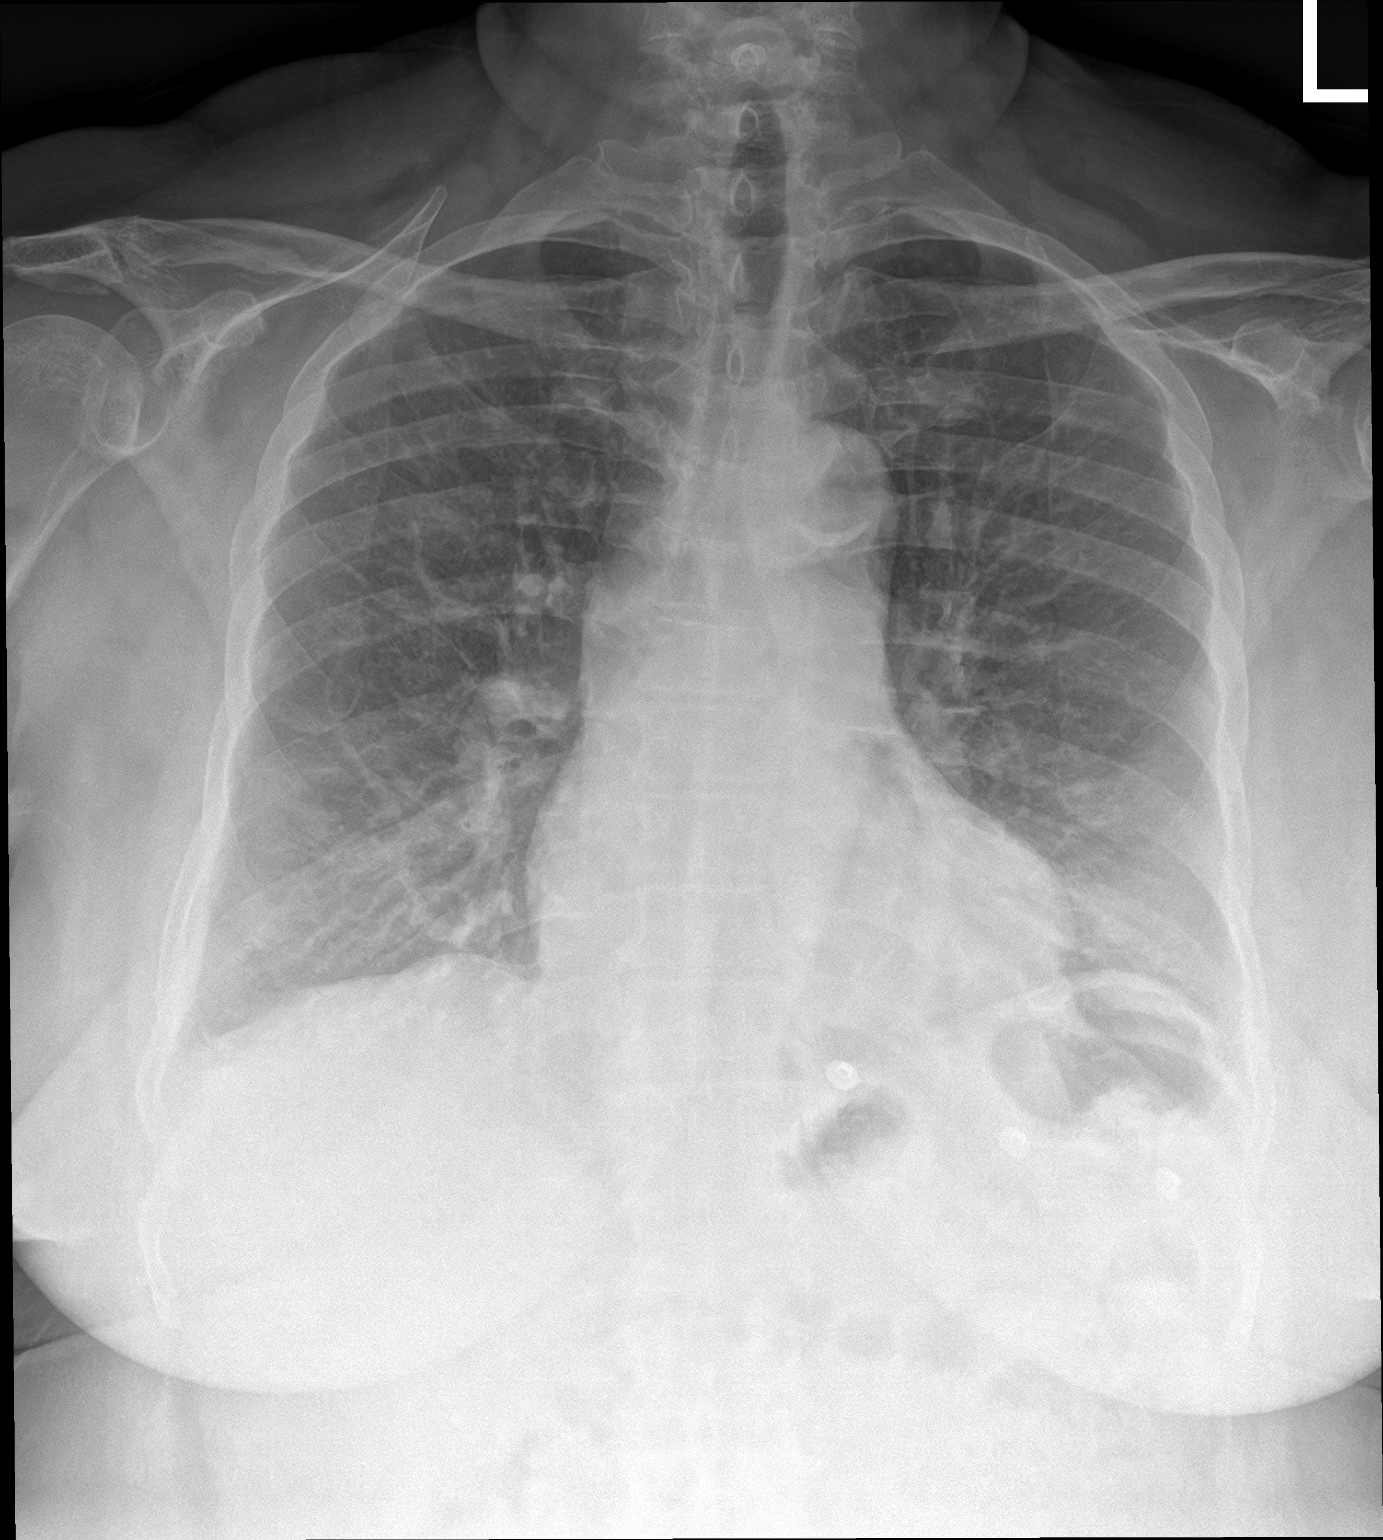

[chest lat]
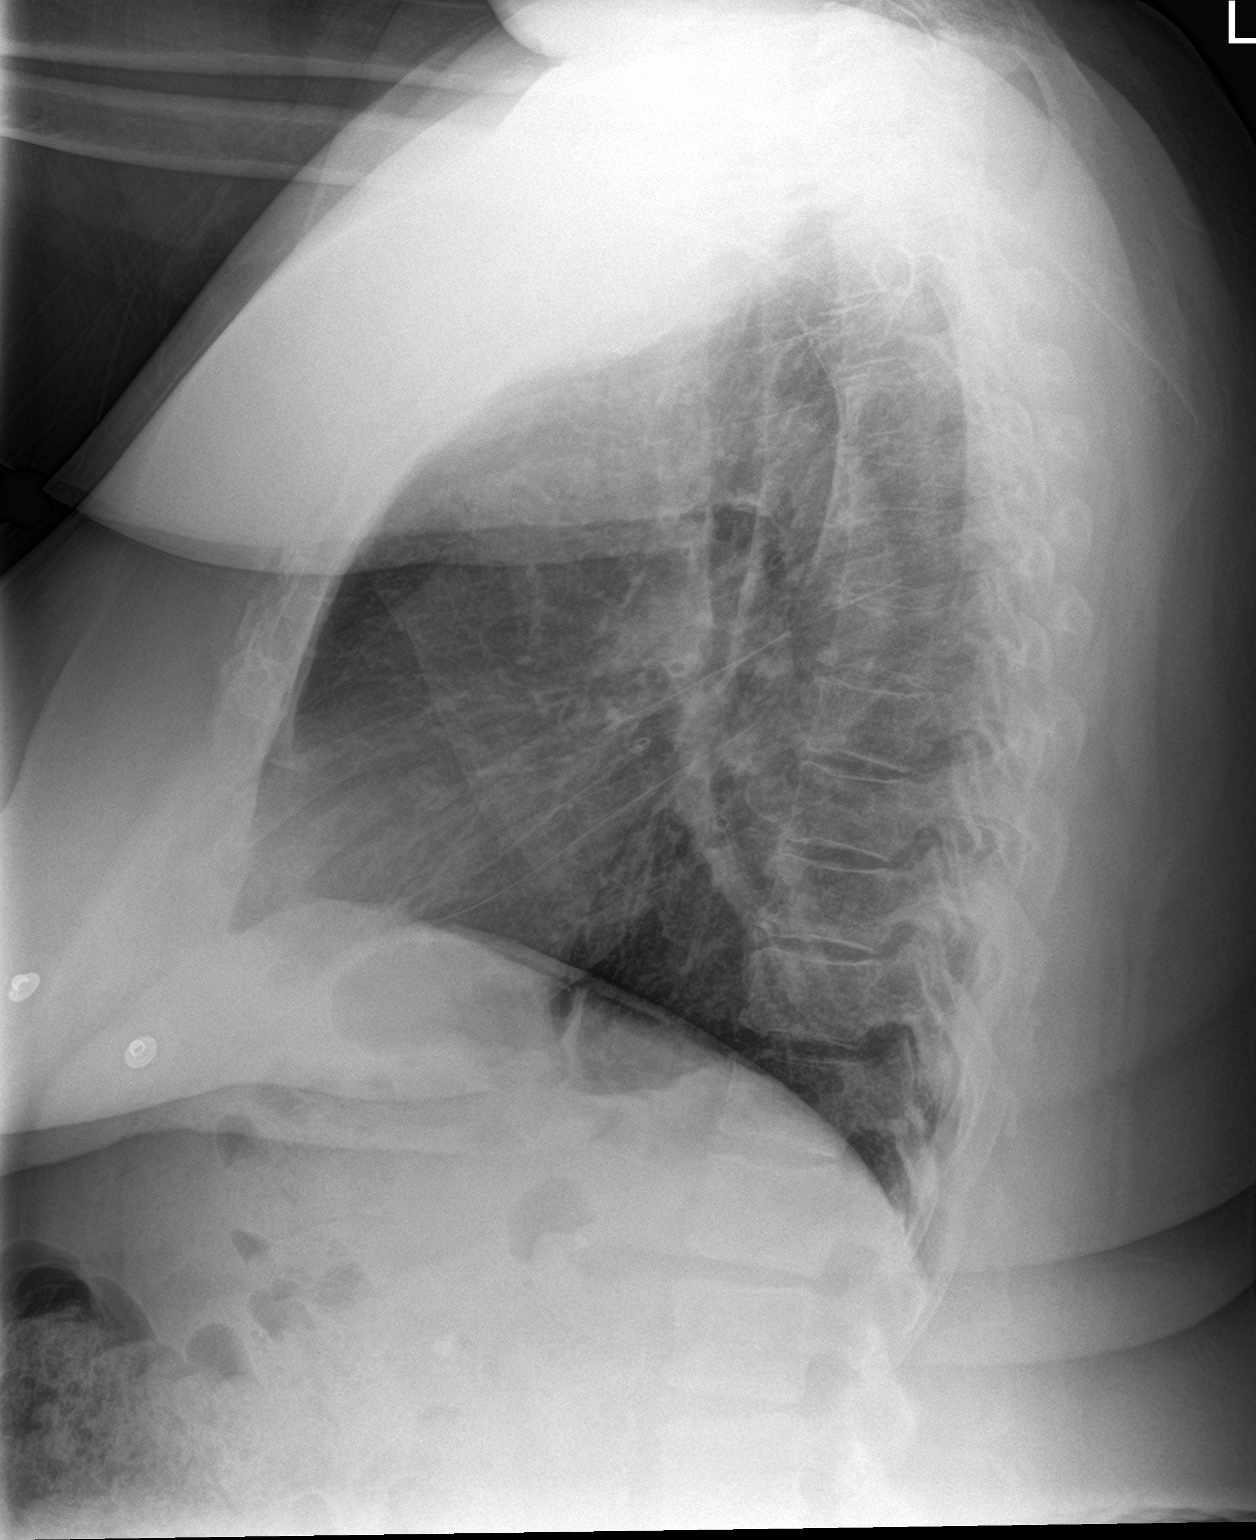

[2 of 2 positions shown; findings below may reference images not displayed]

FINDINGS: Lungs are adequately inflated without focal airspace consolidation
or effusion. Cardiomediastinal silhouette is within normal. There is
calcified plaque over the aortic arch. There mild degenerate changes
of the spine.
IMPRESSION: No active cardiopulmonary disease.

## 2018-08-21 ENCOUNTER — Other Ambulatory Visit: Payer: Self-pay

## 2018-08-21 ENCOUNTER — Encounter: Payer: Self-pay | Admitting: Nurse Practitioner

## 2018-08-21 ENCOUNTER — Ambulatory Visit (INDEPENDENT_AMBULATORY_CARE_PROVIDER_SITE_OTHER): Payer: Medicare Other | Admitting: Nurse Practitioner

## 2018-08-21 VITALS — Ht 63.0 in | Wt 241.0 lb

## 2018-08-21 DIAGNOSIS — I10 Essential (primary) hypertension: Secondary | ICD-10-CM

## 2018-08-21 DIAGNOSIS — L209 Atopic dermatitis, unspecified: Secondary | ICD-10-CM

## 2018-08-21 DIAGNOSIS — F17210 Nicotine dependence, cigarettes, uncomplicated: Secondary | ICD-10-CM | POA: Diagnosis not present

## 2018-08-21 DIAGNOSIS — R609 Edema, unspecified: Secondary | ICD-10-CM | POA: Diagnosis not present

## 2018-08-21 MED ORDER — FUROSEMIDE 20 MG PO TABS: 20.0000 mg | ORAL_TABLET | Freq: Every day | ORAL | 1 refills | Status: DC

## 2018-08-21 MED ORDER — TRIAMCINOLONE ACETONIDE 0.5 % EX CREA: TOPICAL_CREAM | Freq: Two times a day (BID) | CUTANEOUS | 1 refills | Status: DC

## 2018-08-21 NOTE — Progress Notes (Signed)
[facility] [place], [place] [address]  Internal MEDICINE  Telephone Visit  Patient Name: CYTHIA BACHTEL  [id number]  [id number]  Date of Service: 08/21/2018  I connected with the patient at 10:31am by telephone and verified the patients identity using two identifiers.   I discussed the limitations, risks, security and privacy concerns of performing an evaluation and management service by telephone and the availability of in person appointments. I also discussed with the patient that there may be a patient responsible charge related to the service.  The patient expressed understanding and agrees to proceed.    Chief Complaint  Patient presents with  . Telephone Assessment  . Telephone Screen  . Follow-up    med refills     The patient has been contacted via telephone for follow up visit due to concerns for spread of novel coronavirus. She admits to having mild, intermittent cough. She continues to smoke cigarettes. She also has some wheezing at night. She does use rescue inhaler as needed which improves symptoms. She states her blood pressure is well controlled. She has no concerns or complaints today.       Current Medication: Outpatient Encounter Medications as of 08/21/2018  Medication Sig  . albuterol (PROVENTIL HFA;VENTOLIN HFA) 108 (90 Base) MCG/ACT inhaler Inhale 2 puffs into the lungs every 6 (six) hours as needed for wheezing or shortness of breath.  Marland Kitchen aspirin EC 81 MG tablet Take 81 mg by mouth daily with lunch.   . carvedilol (COREG) 25 MG tablet Take 1 tablet (25 mg total) by mouth daily with lunch.  . ergocalciferol (DRISDOL) 1.25 MG (50000 UT) capsule Take 1 capsule (50,000 Units total) by mouth once a week.  . furosemide (LASIX) 20 MG tablet Take 1 tablet (20 mg total) by mouth daily.  Marland Kitchen losartan-hydrochlorothiazide (HYZAAR) 100-25 MG tablet Take 1 tablet by mouth daily with lunch.  . lovastatin (MEVACOR) 40 MG tablet Take 1 tablet (40 mg total)  by mouth at bedtime.  . naproxen sodium (ALEVE) 220 MG tablet Take 440 mg by mouth daily as needed (pain).  . potassium chloride SA (K-DUR,KLOR-CON) 20 MEQ tablet Take 1 tablet (20 mEq total) by mouth daily.  Marland Kitchen triamcinolone cream (KENALOG) 0.5 % Apply topically 2 (two) times daily.  . [DISCONTINUED] furosemide (LASIX) 20 MG tablet Take 1 tablet (20 mg total) by mouth daily.  . [DISCONTINUED] triamcinolone cream (KENALOG) 0.5 % APPLY TO AFFECTED AREA TWICE DAILY FOR RASH  . bisacodyl (DULCOLAX) 5 MG EC tablet Take 5 mg by mouth daily as needed for moderate constipation.  . [DISCONTINUED] sulfamethoxazole-trimethoprim (BACTRIM DS,SEPTRA DS) 800-160 MG tablet Take 1 tablet by mouth 2 (two) times daily. (Patient not taking: Reported on 08/21/2018)   No facility-administered encounter medications on file as of 08/21/2018.     Surgical History: Past Surgical History:  Procedure Laterality Date  . CESAREAN SECTION     denies 09/03/17  . COLONOSCOPY    . HEMORRHOID SURGERY    . HYSTEROSCOPY W/D&C N/A 09/11/2017   Procedure: DILATATION AND CURETTAGE /HYSTEROSCOPY;  Surgeon: Malachy Mood, MD;  Location: [facility] ORS;  Service: Gynecology;  Laterality: N/A;    Medical History: Past Medical History:  Diagnosis Date  . Arthritis   . Hyperlipidemia   . Hypertension   . Sinus infection   . Sleep apnea     Family History: Family History  Problem Relation Age of Onset  . Stroke Mother   . Diabetes Brother   .  Rheumatic fever Brother   . Arthritis/Rheumatoid Brother     Social History   Socioeconomic History  . Marital status: Married    Spouse name: Not on file  . Number of children: Not on file  . Years of education: Not on file  . Highest education level: Not on file  Occupational History  . Not on file  Social Needs  . Financial resource strain: Not on file  . Food insecurity:    Worry: Not on file    Inability: Not on file  . Transportation needs:    Medical: Not on file     Non-medical: Not on file  Tobacco Use  . Smoking status: Current Every Day Smoker    Packs/day: 1.00    Years: 30.00    Pack years: 30.00    Types: Cigarettes  . Smokeless tobacco: Never Used  Substance and Sexual Activity  . Alcohol use: Yes    Alcohol/week: 0.0 standard drinks    Comment: occasionally  . Drug use: No  . Sexual activity: Not Currently    Birth control/protection: None  Lifestyle  . Physical activity:    Days per week: Not on file    Minutes per session: Not on file  . Stress: Not on file  Relationships  . Social connections:    Talks on phone: Not on file    Gets together: Not on file    Attends religious service: Not on file    Active member of club or organization: Not on file    Attends meetings of clubs or organizations: Not on file    Relationship status: Not on file  . Intimate partner violence:    Fear of current or ex partner: Not on file    Emotionally abused: Not on file    Physically abused: Not on file    Forced sexual activity: Not on file  Other Topics Concern  . Not on file  Social History Narrative  . Not on file      Review of Systems  Constitutional: Negative for chills, fatigue and unexpected weight change.  HENT: Negative for congestion, rhinorrhea, sneezing and sore throat.   Respiratory: Positive for cough and wheezing. Negative for chest tightness and shortness of breath.        Intermittent  Cardiovascular: Negative for chest pain and palpitations.  Gastrointestinal: Negative for abdominal pain, constipation, diarrhea, nausea and vomiting.  Endocrine: Negative for cold intolerance, heat intolerance, polydipsia and polyuria.  Musculoskeletal: Negative for arthralgias, back pain, joint swelling and neck pain.  Skin: Negative for rash.       Continues to have intermittent flare ups of eczema to her lower legs. Able to use triamcinolone cream twice daily and this really helps.   Allergic/Immunologic: Negative for environmental  allergies.  Neurological: Negative for tremors and numbness.  Hematological: Negative for adenopathy. Does not bruise/bleed easily.  Psychiatric/Behavioral: Negative for behavioral problems and sleep disturbance. The patient is not nervous/anxious.     Today's Vitals   08/21/18 1002  Weight: 241 lb (109.3 kg)  Height: 5\' 3"  (1.6 m)   Body mass index is 42.69 kg/m.  Observation/Objective:  The patient is alert and oriented. Breathing is non labored and no wheezing is appreciated at this time. She is in no acute distress.    Assessment/Plan: 1. Hypertension, unspecified type Generally well controlled. Continue bp medication as prescribed   2. Peripheral edema Continue furosemide 20mg  daily. Refills probided today. - furosemide (LASIX) 20 MG tablet; Take  1 tablet (20 mg total) by mouth daily.  Dispense: 90 tablet; Refill: 1  3. Atopic dermatitis, unspecified type May use triamcinolone 0.5% cream twice daily as needed. Refills provided today. - triamcinolone cream (KENALOG) 0.5 %; Apply topically 2 (two) times daily.  Dispense: 454 g; Refill: 1  4. Cigarette nicotine dependence without complication Encouraged the patient to stop smoking to improve cough and nighttime wheezing.   General Counseling: Katryn verbalizes understanding of the findings of today's phone visit and agrees with plan of treatment. I have discussed any further diagnostic evaluation that may be needed or ordered today. We also reviewed her medications today. she has been encouraged to call the office with any questions or concerns that should arise related to todays visit.  Smoking cessation counseling: 1. Pt acknowledges the risks of long term smoking, she will try to quite smoking. 2. Options for different medications including nicotine products, chewing gum, patch etc, Wellbutrin and Chantix is discussed 3. Goal and date of compete cessation is discussed 4. Total time spent in smoking cessation is 10  min.  This patient was seen by [place] with Dr Lavera Guise as a part of collaborative care agreement  Meds ordered this encounter  Medications  . furosemide (LASIX) 20 MG tablet    Sig: Take 1 tablet (20 mg total) by mouth daily.    Dispense:  90 tablet    Refill:  1    Order Specific Question:   Supervising Provider    Answer:   Lavera Guise [[id number]]  . triamcinolone cream (KENALOG) 0.5 %    Sig: Apply topically 2 (two) times daily.    Dispense:  454 g    Refill:  1    Please fill as jar.    Order Specific Question:   Supervising Provider    Answer:   Lavera Guise [[id number]]    Time spent: 58 Minutes    Dr Lavera Guise Internal medicine

## 2018-09-22 ENCOUNTER — Other Ambulatory Visit: Payer: Self-pay

## 2018-09-22 DIAGNOSIS — E876 Hypokalemia: Secondary | ICD-10-CM

## 2018-09-22 MED ORDER — POTASSIUM CHLORIDE CRYS ER 20 MEQ PO TBCR: 20.0000 meq | EXTENDED_RELEASE_TABLET | Freq: Every day | ORAL | 5 refills | Status: DC

## 2018-10-21 ENCOUNTER — Other Ambulatory Visit: Payer: Self-pay

## 2018-10-21 ENCOUNTER — Ambulatory Visit (INDEPENDENT_AMBULATORY_CARE_PROVIDER_SITE_OTHER): Payer: Medicare Other | Admitting: Nurse Practitioner

## 2018-10-21 ENCOUNTER — Encounter: Payer: Self-pay | Admitting: Nurse Practitioner

## 2018-10-21 VITALS — Ht 63.0 in | Wt 245.0 lb

## 2018-10-21 DIAGNOSIS — R05 Cough: Secondary | ICD-10-CM

## 2018-10-21 DIAGNOSIS — J069 Acute upper respiratory infection, unspecified: Secondary | ICD-10-CM | POA: Diagnosis not present

## 2018-10-21 DIAGNOSIS — R059 Cough, unspecified: Secondary | ICD-10-CM | POA: Insufficient documentation

## 2018-10-21 DIAGNOSIS — I10 Essential (primary) hypertension: Secondary | ICD-10-CM

## 2018-10-21 MED ORDER — CEFUROXIME AXETIL 500 MG PO TABS: 500.0000 mg | ORAL_TABLET | Freq: Two times a day (BID) | ORAL | 0 refills | Status: DC

## 2018-10-21 MED ORDER — PROMETHAZINE-CODEINE 6.25-10 MG/5ML PO SYRP: 5.0000 mL | ORAL_SOLUTION | Freq: Three times a day (TID) | ORAL | 0 refills | Status: DC | PRN

## 2018-10-21 NOTE — Progress Notes (Signed)
[facility] [place], [place] [address]  Internal MEDICINE  Telephone Visit  Patient Name: Tiffany Schaefer  [id number]  [id number]  Date of Service: 10/21/2018  I connected with the patient at 11:18am by telephone and verified the patients identity using two identifiers.   I discussed the limitations, risks, security and privacy concerns of performing an evaluation and management service by telephone and the availability of in person appointments. I also discussed with the patient that there may be a patient responsible charge related to the service.  The patient expressed understanding and agrees to proceed.    Chief Complaint  Patient presents with  . Telephone Screen    PHONE VISIT [phone]  . Telephone Assessment  . Cough    pt still having persistent cough that has been going on for over a month, no fever or chills, have not been around anyone sick that she know of     The patient has been contacted via telephone for follow up visit due to concerns for spread of novel coronavirus. She is complaining of congested cough which is sometimes productive. She states that she has had this off and on for a few months, gradually getting worse. She states that it bothers er a lot at night. Keeps her awake. Can hear herself wheezing at times. Denies headache or fever. Denies sore throat. Does not believe she has been exposed to anyone having COVID 19. She does continue to smoke, though smoking less due to cough.       Current Medication: Outpatient Encounter Medications as of 10/21/2018  Medication Sig  . albuterol (PROVENTIL HFA;VENTOLIN HFA) 108 (90 Base) MCG/ACT inhaler Inhale 2 puffs into the lungs every 6 (six) hours as needed for wheezing or shortness of breath.  Marland Kitchen aspirin EC 81 MG tablet Take 81 mg by mouth daily with lunch.   . bisacodyl (DULCOLAX) 5 MG EC tablet Take 5 mg by mouth daily as needed for moderate constipation.  . carvedilol (COREG) 25 MG tablet  Take 1 tablet (25 mg total) by mouth daily with lunch.  . ergocalciferol (DRISDOL) 1.25 MG (50000 UT) capsule Take 1 capsule (50,000 Units total) by mouth once a week.  . furosemide (LASIX) 20 MG tablet Take 1 tablet (20 mg total) by mouth daily.  Marland Kitchen losartan-hydrochlorothiazide (HYZAAR) 100-25 MG tablet Take 1 tablet by mouth daily with lunch.  . lovastatin (MEVACOR) 40 MG tablet Take 1 tablet (40 mg total) by mouth at bedtime.  . naproxen sodium (ALEVE) 220 MG tablet Take 440 mg by mouth daily as needed (pain).  . potassium chloride SA (K-DUR) 20 MEQ tablet Take 1 tablet (20 mEq total) by mouth daily.  Marland Kitchen triamcinolone cream (KENALOG) 0.5 % Apply topically 2 (two) times daily.  . cefUROXime (CEFTIN) 500 MG tablet Take 1 tablet (500 mg total) by mouth 2 (two) times daily with a meal.  . promethazine-codeine (PHENERGAN WITH CODEINE) 6.25-10 MG/5ML syrup Take 5 mLs by mouth every 8 (eight) hours as needed for cough.   No facility-administered encounter medications on file as of 10/21/2018.     Surgical History: Past Surgical History:  Procedure Laterality Date  . CESAREAN SECTION     denies 09/03/17  . COLONOSCOPY    . HEMORRHOID SURGERY    . HYSTEROSCOPY W/D&C N/A 09/11/2017   Procedure: DILATATION AND CURETTAGE /HYSTEROSCOPY;  Surgeon: Malachy Mood, MD;  Location: [facility] ORS;  Service: Gynecology;  Laterality: N/A;    Medical History: Past Medical History:  Diagnosis Date  . Arthritis   . Hyperlipidemia   . Hypertension   . Sinus infection   . Sleep apnea     Family History: Family History  Problem Relation Age of Onset  . Stroke Mother   . Diabetes Brother   . Rheumatic fever Brother   . Arthritis/Rheumatoid Brother     Social History   Socioeconomic History  . Marital status: Married    Spouse name: Not on file  . Number of children: Not on file  . Years of education: Not on file  . Highest education level: Not on file  Occupational History  . Not on file  Social  Needs  . Financial resource strain: Not on file  . Food insecurity    Worry: Not on file    Inability: Not on file  . Transportation needs    Medical: Not on file    Non-medical: Not on file  Tobacco Use  . Smoking status: Current Every Day Smoker    Packs/day: 1.00    Years: 30.00    Pack years: 30.00    Types: Cigarettes  . Smokeless tobacco: Never Used  Substance and Sexual Activity  . Alcohol use: Yes    Alcohol/week: 0.0 standard drinks    Comment: occasionally  . Drug use: No  . Sexual activity: Not Currently    Birth control/protection: None  Lifestyle  . Physical activity    Days per week: Not on file    Minutes per session: Not on file  . Stress: Not on file  Relationships  . Social Herbalist on phone: Not on file    Gets together: Not on file    Attends religious service: Not on file    Active member of club or organization: Not on file    Attends meetings of clubs or organizations: Not on file    Relationship status: Not on file  . Intimate partner violence    Fear of current or ex partner: Not on file    Emotionally abused: Not on file    Physically abused: Not on file    Forced sexual activity: Not on file  Other Topics Concern  . Not on file  Social History Narrative  . Not on file      Review of Systems  Constitutional: Positive for fatigue. Negative for chills and unexpected weight change.  HENT: Negative for congestion, rhinorrhea, sneezing and sore throat.   Respiratory: Positive for cough and wheezing. Negative for chest tightness and shortness of breath.        Gradually getting worse   Cardiovascular: Negative for chest pain and palpitations.  Gastrointestinal: Negative for abdominal pain, constipation, diarrhea, nausea and vomiting.  Endocrine: Negative for cold intolerance, heat intolerance, polydipsia and polyuria.  Musculoskeletal: Negative for arthralgias, back pain, joint swelling and neck pain.  Skin: Negative for rash.        Continues to have intermittent flare ups of eczema to her lower legs. Able to use triamcinolone cream twice daily and this really helps.   Allergic/Immunologic: Negative for environmental allergies.  Neurological: Positive for headaches. Negative for tremors and numbness.  Hematological: Negative for adenopathy. Does not bruise/bleed easily.  Psychiatric/Behavioral: Negative for behavioral problems and sleep disturbance. The patient is not nervous/anxious.     Today's Vitals   10/21/18 1008  Weight: 245 lb (111.1 kg)  Height: 5\' 3"  (1.6 m)   Body mass index is 43.4 kg/m.  Observation/Objective:   The  patient is alert and oriented. She is pleasant and answers all questions appropriately. Breathing is non-labored. She is in no acute distress at this time. The patient is stopped up. A congested cough can be heard during the phone visit.    Assessment/Plan: 1. Acute upper respiratory infection Start ceftin 500mg  twice daily for 20 days. Rest and increase fluids. Continue to use OTC medication to improve acute symptoms.  - cefUROXime (CEFTIN) 500 MG tablet; Take 1 tablet (500 mg total) by mouth 2 (two) times daily with a meal.  Dispense: 20 tablet; Refill: 0  2. Cough May take promethazine/codeine cough suppressant as needed and as prescribed for cough. Advised patient not to overuse this medicine and not to mix with other medications or alcohol as it can cause respiratory distress, sleepiness or dizziness. Should also avoid driving. Patient voiced understanding and agreement.  - promethazine-codeine (PHENERGAN WITH CODEINE) 6.25-10 MG/5ML syrup; Take 5 mLs by mouth every 8 (eight) hours as needed for cough.  Dispense: 120 mL; Refill: 0  3. Hypertension, unspecified type Continue bp medication as prescribed    General Counseling: Meera verbalizes understanding of the findings of today's phone visit and agrees with plan of treatment. I have discussed any further diagnostic evaluation  that may be needed or ordered today. We also reviewed her medications today. she has been encouraged to call the office with any questions or concerns that should arise related to todays visit.  Rest and increase fluids. Continue using OTC medication to control symptoms.   This patient was seen by [place] with Dr Lavera Guise as a part of collaborative care agreement  Meds ordered this encounter  Medications  . cefUROXime (CEFTIN) 500 MG tablet    Sig: Take 1 tablet (500 mg total) by mouth 2 (two) times daily with a meal.    Dispense:  20 tablet    Refill:  0    Order Specific Question:   Supervising Provider    Answer:   Lavera Guise [place]  . promethazine-codeine (PHENERGAN WITH CODEINE) 6.25-10 MG/5ML syrup    Sig: Take 5 mLs by mouth every 8 (eight) hours as needed for cough.    Dispense:  120 mL    Refill:  0    Order Specific Question:   Supervising Provider    Answer:   Lavera Guise [[id number]]    Time spent: 17 Minutes    Dr Lavera Guise Internal medicine

## 2018-11-04 ENCOUNTER — Other Ambulatory Visit: Payer: Self-pay

## 2018-11-04 ENCOUNTER — Telehealth: Payer: Self-pay

## 2018-11-04 DIAGNOSIS — I10 Essential (primary) hypertension: Secondary | ICD-10-CM

## 2018-11-04 DIAGNOSIS — E559 Vitamin D deficiency, unspecified: Secondary | ICD-10-CM

## 2018-11-04 MED ORDER — CARVEDILOL 25 MG PO TABS: 25.0000 mg | ORAL_TABLET | Freq: Every day | ORAL | 3 refills | Status: DC

## 2018-11-04 MED ORDER — ERGOCALCIFEROL 1.25 MG (50000 UT) PO CAPS: 50000.0000 [IU] | ORAL_CAPSULE | ORAL | 5 refills | Status: DC

## 2018-11-24 ENCOUNTER — Encounter: Payer: Self-pay | Admitting: Nurse Practitioner

## 2018-11-24 ENCOUNTER — Other Ambulatory Visit: Payer: Self-pay

## 2018-11-24 ENCOUNTER — Ambulatory Visit (INDEPENDENT_AMBULATORY_CARE_PROVIDER_SITE_OTHER): Payer: Medicare Other | Admitting: Nurse Practitioner

## 2018-11-24 VITALS — BP 113/66 | HR 93 | Resp 16 | Ht 63.0 in | Wt 241.0 lb

## 2018-11-24 DIAGNOSIS — R062 Wheezing: Secondary | ICD-10-CM | POA: Diagnosis not present

## 2018-11-24 DIAGNOSIS — Z1239 Encounter for other screening for malignant neoplasm of breast: Secondary | ICD-10-CM | POA: Diagnosis not present

## 2018-11-24 DIAGNOSIS — E876 Hypokalemia: Secondary | ICD-10-CM

## 2018-11-24 DIAGNOSIS — I10 Essential (primary) hypertension: Secondary | ICD-10-CM | POA: Diagnosis not present

## 2018-11-24 DIAGNOSIS — E782 Mixed hyperlipidemia: Secondary | ICD-10-CM

## 2018-11-24 MED ORDER — LOVASTATIN 40 MG PO TABS: 40.0000 mg | ORAL_TABLET | Freq: Every day | ORAL | 3 refills | Status: DC

## 2018-11-24 MED ORDER — ALBUTEROL SULFATE HFA 108 (90 BASE) MCG/ACT IN AERS: 2.0000 | INHALATION_SPRAY | Freq: Four times a day (QID) | RESPIRATORY_TRACT | 3 refills | Status: DC | PRN

## 2018-11-24 NOTE — Progress Notes (Signed)
[facility] [place], [place] [address]  Internal MEDICINE  Office Visit Note  Patient Name: Tiffany Schaefer  [id number]  [id number]  Date of Service: 11/24/2018  Chief Complaint  Patient presents with  . Hypertension  . Hyperlipidemia  . Quality Metric Gaps    pt needs mammogram    The patient is here for follow up visit. She continues to having mild, intermittent cough. This has improved over the past few months.  She continues to smoke cigarettes. She also has some wheezing at night which has also improved. She does use rescue inhaler as needed which improves symptoms. Her blood pressure is well controlled. She is due to have screening mammogram. She has no other concerns or complaints.       Current Medication: Outpatient Encounter Medications as of 11/24/2018  Medication Sig  . albuterol (VENTOLIN HFA) 108 (90 Base) MCG/ACT inhaler Inhale 2 puffs into the lungs every 6 (six) hours as needed for wheezing or shortness of breath.  Marland Kitchen aspirin EC 81 MG tablet Take 81 mg by mouth daily with lunch.   . bisacodyl (DULCOLAX) 5 MG EC tablet Take 5 mg by mouth daily as needed for moderate constipation.  . carvedilol (COREG) 25 MG tablet Take 1 tablet (25 mg total) by mouth daily with lunch.  . cefUROXime (CEFTIN) 500 MG tablet Take 1 tablet (500 mg total) by mouth 2 (two) times daily with a meal.  . ergocalciferol (DRISDOL) 1.25 MG (50000 UT) capsule Take 1 capsule (50,000 Units total) by mouth once a week.  . furosemide (LASIX) 20 MG tablet Take 1 tablet (20 mg total) by mouth daily.  Marland Kitchen losartan-hydrochlorothiazide (HYZAAR) 100-25 MG tablet Take 1 tablet by mouth daily with lunch.  . lovastatin (MEVACOR) 40 MG tablet Take 1 tablet (40 mg total) by mouth at bedtime.  . naproxen sodium (ALEVE) 220 MG tablet Take 440 mg by mouth daily as needed (pain).  . potassium chloride SA (K-DUR) 20 MEQ tablet Take 1 tablet (20 mEq total) by mouth daily.  . promethazine-codeine  (PHENERGAN WITH CODEINE) 6.25-10 MG/5ML syrup Take 5 mLs by mouth every 8 (eight) hours as needed for cough.  . triamcinolone cream (KENALOG) 0.5 % Apply topically 2 (two) times daily.  . [DISCONTINUED] albuterol (PROVENTIL HFA;VENTOLIN HFA) 108 (90 Base) MCG/ACT inhaler Inhale 2 puffs into the lungs every 6 (six) hours as needed for wheezing or shortness of breath.  . [DISCONTINUED] lovastatin (MEVACOR) 40 MG tablet Take 1 tablet (40 mg total) by mouth at bedtime.   No facility-administered encounter medications on file as of 11/24/2018.     Surgical History: Past Surgical History:  Procedure Laterality Date  . CESAREAN SECTION     denies 09/03/17  . COLONOSCOPY    . HEMORRHOID SURGERY    . HYSTEROSCOPY W/D&C N/A 09/11/2017   Procedure: DILATATION AND CURETTAGE /HYSTEROSCOPY;  Surgeon: Malachy Mood, MD;  Location: [facility] ORS;  Service: Gynecology;  Laterality: N/A;    Medical History: Past Medical History:  Diagnosis Date  . Arthritis   . Hyperlipidemia   . Hypertension   . Sinus infection   . Sleep apnea     Family History: Family History  Problem Relation Age of Onset  . Stroke Mother   . Diabetes Brother   . Rheumatic fever Brother   . Arthritis/Rheumatoid Brother     Social History   Socioeconomic History  . Marital status: Married    Spouse name: Not on file  .  Number of children: Not on file  . Years of education: Not on file  . Highest education level: Not on file  Occupational History  . Not on file  Social Needs  . Financial resource strain: Not on file  . Food insecurity    Worry: Not on file    Inability: Not on file  . Transportation needs    Medical: Not on file    Non-medical: Not on file  Tobacco Use  . Smoking status: Current Every Day Smoker    Packs/day: 1.00    Years: 30.00    Pack years: 30.00    Types: Cigarettes  . Smokeless tobacco: Never Used  Substance and Sexual Activity  . Alcohol use: Yes    Alcohol/week: 0.0 standard drinks     Comment: occasionally  . Drug use: No  . Sexual activity: Not Currently    Birth control/protection: None  Lifestyle  . Physical activity    Days per week: Not on file    Minutes per session: Not on file  . Stress: Not on file  Relationships  . Social Herbalist on phone: Not on file    Gets together: Not on file    Attends religious service: Not on file    Active member of club or organization: Not on file    Attends meetings of clubs or organizations: Not on file    Relationship status: Not on file  . Intimate partner violence    Fear of current or ex partner: Not on file    Emotionally abused: Not on file    Physically abused: Not on file    Forced sexual activity: Not on file  Other Topics Concern  . Not on file  Social History Narrative  . Not on file      Review of Systems  Constitutional: Negative for chills, fatigue and unexpected weight change.  HENT: Negative for congestion, rhinorrhea, sneezing and sore throat.   Respiratory: Positive for cough and wheezing. Negative for chest tightness and shortness of breath.        Intermittent and improving  Cardiovascular: Negative for chest pain and palpitations.  Gastrointestinal: Negative for abdominal pain, constipation, diarrhea, nausea and vomiting.  Endocrine: Negative for cold intolerance, heat intolerance, polydipsia and polyuria.  Musculoskeletal: Negative for arthralgias, back pain, joint swelling and neck pain.  Skin: Negative for rash.       Continues to have intermittent flare ups of eczema to her lower legs. Able to use triamcinolone cream twice daily and this really helps.   Allergic/Immunologic: Negative for environmental allergies.  Neurological: Negative for dizziness, tremors, numbness and headaches.  Hematological: Negative for adenopathy. Does not bruise/bleed easily.  Psychiatric/Behavioral: Negative for behavioral problems and sleep disturbance. The patient is not nervous/anxious.      Today's Vitals   11/24/18 0946  BP: 113/66  Pulse: 93  Resp: 16  SpO2: 97%  Weight: 241 lb (109.3 kg)  Height: 5\' 3"  (1.6 m)   Body mass index is 42.69 kg/m.  Physical Exam Vitals signs and nursing note reviewed.  Constitutional:      General: She is not in acute distress.    Appearance: Normal appearance. She is well-developed. She is obese. She is not diaphoretic.  HENT:     Head: Normocephalic and atraumatic.     Mouth/Throat:     Pharynx: No oropharyngeal exudate.  Eyes:     Pupils: Pupils are equal, round, and reactive to light.  Neck:  Musculoskeletal: Normal range of motion and neck supple.     Thyroid: No thyromegaly.     Vascular: No JVD.     Trachea: No tracheal deviation.  Cardiovascular:     Rate and Rhythm: Normal rate and regular rhythm.     Heart sounds: Normal heart sounds. No murmur. No friction rub. No gallop.   Pulmonary:     Effort: Pulmonary effort is normal. No respiratory distress.     Breath sounds: Normal breath sounds. No wheezing or rales.  Chest:     Chest wall: No tenderness.  Abdominal:     General: Bowel sounds are normal. There is no distension.     Palpations: Abdomen is soft. There is no mass.     Tenderness: There is no abdominal tenderness. There is no guarding or rebound.     Hernia: No hernia is present.  Musculoskeletal: Normal range of motion.  Lymphadenopathy:     Cervical: No cervical adenopathy.  Skin:    General: Skin is warm and dry.  Neurological:     Mental Status: She is alert and oriented to person, place, and time.     Cranial Nerves: No cranial nerve deficit.  Psychiatric:        Behavior: Behavior normal.        Thought Content: Thought content normal.        Judgment: Judgment normal.    Assessment/Plan: 1. Hypertension, unspecified type Blood pressure well managed. Continue BP medication as prescribed   2. Wheezing Improving. Use rescue inhaler as needed and as prescribed for wheezing.  Encouraged smoking cessation.  - albuterol (VENTOLIN HFA) 108 (90 Base) MCG/ACT inhaler; Inhale 2 puffs into the lungs every 6 (six) hours as needed for wheezing or shortness of breath.  Dispense: 18 g; Refill: 3  3. Mixed hyperlipidemia Renew lovastatin.  - lovastatin (MEVACOR) 40 MG tablet; Take 1 tablet (40 mg total) by mouth at bedtime.  Dispense: 90 tablet; Refill: 3  4. Hypokalemia Continue potassium 88meq every day.   5. Screening for breast cancer - MM DIGITAL SCREENING BILATERAL; Future  General Counseling: Artice verbalizes understanding of the findings of todays visit and agrees with plan of treatment. I have discussed any further diagnostic evaluation that may be needed or ordered today. We also reviewed her medications today. she has been encouraged to call the office with any questions or concerns that should arise related to todays visit.   Hypertension Counseling:   The following hypertensive lifestyle modification were recommended and discussed:  1. Limiting alcohol intake to less than 1 oz/day of ethanol:(24 oz of beer or 8 oz of wine or 2 oz of 100-proof whiskey). 2. Take baby ASA 81 mg daily. 3. Importance of regular aerobic exercise and losing weight. 4. Reduce dietary saturated fat and cholesterol intake for overall cardiovascular health. 5. Maintaining adequate dietary potassium, calcium, and magnesium intake. 6. Regular monitoring of the blood pressure. 7. Reduce sodium intake to less than 100 mmol/day (less than 2.3 gm of sodium or less than 6 gm of sodium choride)   This patient was seen by [place] with Dr Lavera Guise as a part of collaborative care agreement  Orders Placed This Encounter  Procedures  . MM DIGITAL SCREENING BILATERAL    Meds ordered this encounter  Medications  . albuterol (VENTOLIN HFA) 108 (90 Base) MCG/ACT inhaler    Sig: Inhale 2 puffs into the lungs every 6 (six) hours as needed for wheezing or shortness  of  breath.    Dispense:  18 g    Refill:  3    Please fill with equivalent which patient's insurance prefers    Order Specific Question:   Supervising Provider    Answer:   Lavera Guise [[id number]]  . lovastatin (MEVACOR) 40 MG tablet    Sig: Take 1 tablet (40 mg total) by mouth at bedtime.    Dispense:  90 tablet    Refill:  3    Order Specific Question:   Supervising Provider    Answer:   Lavera Guise [[id number]]    Time spent: 57 Minutes      Dr Lavera Guise Internal medicine

## 2018-12-03 DIAGNOSIS — Z1231 Encounter for screening mammogram for malignant neoplasm of breast: Secondary | ICD-10-CM | POA: Diagnosis not present

## 2019-02-22 ENCOUNTER — Other Ambulatory Visit: Payer: Self-pay

## 2019-02-22 DIAGNOSIS — R609 Edema, unspecified: Secondary | ICD-10-CM

## 2019-02-22 DIAGNOSIS — I10 Essential (primary) hypertension: Secondary | ICD-10-CM

## 2019-02-22 DIAGNOSIS — E876 Hypokalemia: Secondary | ICD-10-CM

## 2019-02-22 MED ORDER — FUROSEMIDE 20 MG PO TABS: 20.0000 mg | ORAL_TABLET | Freq: Every day | ORAL | 1 refills | Status: DC

## 2019-02-22 MED ORDER — LOSARTAN POTASSIUM-HCTZ 100-25 MG PO TABS: 1.0000 | ORAL_TABLET | Freq: Every day | ORAL | 5 refills | Status: DC

## 2019-02-22 MED ORDER — POTASSIUM CHLORIDE CRYS ER 20 MEQ PO TBCR: 20.0000 meq | EXTENDED_RELEASE_TABLET | Freq: Every day | ORAL | 5 refills | Status: DC

## 2019-02-26 ENCOUNTER — Encounter: Payer: Self-pay | Admitting: Nurse Practitioner

## 2019-02-26 ENCOUNTER — Ambulatory Visit (INDEPENDENT_AMBULATORY_CARE_PROVIDER_SITE_OTHER): Payer: Medicare Other | Admitting: Nurse Practitioner

## 2019-02-26 ENCOUNTER — Other Ambulatory Visit: Payer: Self-pay

## 2019-02-26 VITALS — BP 113/67 | HR 87 | Temp 97.5°F | Resp 16 | Ht 63.0 in | Wt 241.8 lb

## 2019-02-26 DIAGNOSIS — Z0001 Encounter for general adult medical examination with abnormal findings: Secondary | ICD-10-CM | POA: Diagnosis not present

## 2019-02-26 DIAGNOSIS — M25571 Pain in right ankle and joints of right foot: Secondary | ICD-10-CM

## 2019-02-26 DIAGNOSIS — E876 Hypokalemia: Secondary | ICD-10-CM | POA: Diagnosis not present

## 2019-02-26 DIAGNOSIS — R300 Dysuria: Secondary | ICD-10-CM

## 2019-02-26 DIAGNOSIS — I10 Essential (primary) hypertension: Secondary | ICD-10-CM | POA: Diagnosis not present

## 2019-02-26 NOTE — Progress Notes (Signed)
[facility] [place], [place] [address]  Internal MEDICINE  Office Visit Note  Patient Name: Tiffany Schaefer  [id number]  [id number]  Date of Service: 03/14/2019   Pt is here for routine health maintenance examination   Chief Complaint  Patient presents with  . Annual Exam  . Hypertension  . Hyperlipidemia     The patient is here for health maintenance exam. States that she had fall in her home earlier this month. Right foot got swollen and very tender. Was unable to put a shoe on her foot for a few weeks. She states this has gradually gotten better. She is able to roll the ankle and move the foot without difficulty. There is still some swelling present.   Current Medication: Outpatient Encounter Medications as of 02/26/2019  Medication Sig  . albuterol (VENTOLIN HFA) 108 (90 Base) MCG/ACT inhaler Inhale 2 puffs into the lungs every 6 (six) hours as needed for wheezing or shortness of breath.  Marland Kitchen aspirin EC 81 MG tablet Take 81 mg by mouth daily with lunch.   . bisacodyl (DULCOLAX) 5 MG EC tablet Take 5 mg by mouth daily as needed for moderate constipation.  . carvedilol (COREG) 25 MG tablet Take 1 tablet (25 mg total) by mouth daily with lunch.  . furosemide (LASIX) 20 MG tablet Take 1 tablet (20 mg total) by mouth daily.  Marland Kitchen losartan-hydrochlorothiazide (HYZAAR) 100-25 MG tablet Take 1 tablet by mouth daily with lunch.  . lovastatin (MEVACOR) 40 MG tablet Take 1 tablet (40 mg total) by mouth at bedtime.  . potassium chloride SA (KLOR-CON) 20 MEQ tablet Take 1 tablet (20 mEq total) by mouth daily.  Marland Kitchen triamcinolone cream (KENALOG) 0.5 % Apply topically 2 (two) times daily.  . [DISCONTINUED] carvedilol (COREG) 25 MG tablet Take 1 tablet (25 mg total) by mouth daily with lunch.  . [DISCONTINUED] cefUROXime (CEFTIN) 500 MG tablet Take 1 tablet (500 mg total) by mouth 2 (two) times daily with a meal. (Patient not taking: Reported on 02/26/2019)  . [DISCONTINUED]  ergocalciferol (DRISDOL) 1.25 MG (50000 UT) capsule Take 1 capsule (50,000 Units total) by mouth once a week. (Patient not taking: Reported on 02/26/2019)  . [DISCONTINUED] losartan-hydrochlorothiazide (HYZAAR) 100-25 MG tablet Take 1 tablet by mouth daily with lunch.  . [DISCONTINUED] naproxen sodium (ALEVE) 220 MG tablet Take 440 mg by mouth daily as needed (pain).  . [DISCONTINUED] promethazine-codeine (PHENERGAN WITH CODEINE) 6.25-10 MG/5ML syrup Take 5 mLs by mouth every 8 (eight) hours as needed for cough. (Patient not taking: Reported on 02/26/2019)   No facility-administered encounter medications on file as of 02/26/2019.     Surgical History: Past Surgical History:  Procedure Laterality Date  . CESAREAN SECTION     denies 09/03/17  . COLONOSCOPY    . HEMORRHOID SURGERY    . HYSTEROSCOPY W/D&C N/A 09/11/2017   Procedure: DILATATION AND CURETTAGE /HYSTEROSCOPY;  Surgeon: Malachy Mood, MD;  Location: [facility] ORS;  Service: Gynecology;  Laterality: N/A;    Medical History: Past Medical History:  Diagnosis Date  . Arthritis   . Hyperlipidemia   . Hypertension   . Sinus infection   . Sleep apnea     Family History: Family History  Problem Relation Age of Onset  . Stroke Mother   . Diabetes Brother   . Rheumatic fever Brother   . Arthritis/Rheumatoid Brother       Review of Systems  Constitutional: Negative for activity change, chills, fatigue and unexpected weight  change.  HENT: Negative for congestion, postnasal drip, rhinorrhea, sneezing and sore throat.   Respiratory: Negative for cough, chest tightness, shortness of breath and wheezing.   Cardiovascular: Negative for chest pain and palpitations.  Gastrointestinal: Negative for abdominal pain, constipation, diarrhea, nausea and vomiting.  Endocrine: Negative for cold intolerance, heat intolerance, polydipsia and polyuria.  Genitourinary: Negative for dysuria, frequency and urgency.  Musculoskeletal: Positive for  arthralgias and joint swelling. Negative for back pain and neck pain.       Right ankle and foot pain. Has improved over the past few days.   Skin: Negative for rash.  Allergic/Immunologic: Negative for environmental allergies.  Neurological: Negative.  Negative for tremors and numbness.  Hematological: Negative for adenopathy. Does not bruise/bleed easily.  Psychiatric/Behavioral: Negative for behavioral problems (Depression), sleep disturbance and suicidal ideas. The patient is not nervous/anxious.      Today's Vitals   02/26/19 1129  BP: 113/67  Pulse: 87  Resp: 16  Temp: (!) 97.5 F (36.4 C)  SpO2: 98%  Weight: 241 lb 12.8 oz (109.7 kg)  Height: 5\' 3"  (1.6 m)   Body mass index is 42.83 kg/m.  Physical Exam Vitals signs and nursing note reviewed.  Constitutional:      General: She is not in acute distress.    Appearance: Normal appearance. She is well-developed. She is obese. She is not diaphoretic.  HENT:     Head: Normocephalic and atraumatic.     Nose: Nose normal.     Mouth/Throat:     Pharynx: No oropharyngeal exudate.  Eyes:     Extraocular Movements: Extraocular movements intact.     Pupils: Pupils are equal, round, and reactive to light.  Neck:     Musculoskeletal: Normal range of motion and neck supple.     Thyroid: No thyromegaly.     Vascular: No JVD.     Trachea: No tracheal deviation.  Cardiovascular:     Rate and Rhythm: Normal rate and regular rhythm.     Pulses: Normal pulses.     Heart sounds: Normal heart sounds. No murmur. No friction rub. No gallop.   Pulmonary:     Effort: Pulmonary effort is normal. No respiratory distress.     Breath sounds: Normal breath sounds. No wheezing or rales.  Chest:     Chest wall: No tenderness.     Breasts:        Right: Normal. No swelling, bleeding, inverted nipple, mass, nipple discharge, skin change or tenderness.        Left: Normal. No swelling, bleeding, inverted nipple, mass, nipple discharge, skin  change or tenderness.  Abdominal:     General: Bowel sounds are normal.     Palpations: Abdomen is soft.     Tenderness: There is no abdominal tenderness.  Musculoskeletal: Normal range of motion.     Comments: Mild right ankle pain with swelling present. Tenderness with palpation. ROM is slightly limited due to pain and strength is intact.   Lymphadenopathy:     Cervical: No cervical adenopathy.     Upper Body:     Right upper body: No supraclavicular or axillary adenopathy.     Left upper body: No supraclavicular or axillary adenopathy.  Skin:    General: Skin is warm and dry.  Neurological:     Mental Status: She is alert and oriented to person, place, and time.     Cranial Nerves: No cranial nerve deficit.  Psychiatric:  Mood and Affect: Mood normal.        Behavior: Behavior normal.        Thought Content: Thought content normal.        Judgment: Judgment normal.    Depression screen [facility] 2/9 02/26/2019 11/24/2018 08/21/2018 02/20/2018 06/10/2017  Decreased Interest 0 0 0 0 0  Down, Depressed, Hopeless 0 0 0 0 0  PHQ - 2 Score 0 0 0 0 0  Altered sleeping - - - - -  Tired, decreased energy - - - - -  Change in appetite - - - - -  Feeling bad or failure about yourself  - - - - -  Trouble concentrating - - - - -  Moving slowly or fidgety/restless - - - - -  Suicidal thoughts - - - - -  PHQ-9 Score - - - - -  Difficult doing work/chores - - - - -    Functional Status Survey: Is the patient deaf or have difficulty hearing?: Yes(ringing in ears) Does the patient have difficulty seeing, even when wearing glasses/contacts?: No Does the patient have difficulty concentrating, remembering, or making decisions?: No Does the patient have difficulty walking or climbing stairs?: Yes(climbing stairs) Does the patient have difficulty dressing or bathing?: No Does the patient have difficulty doing errands alone such as visiting a doctor's office or shopping?: No  MMSE - [place] Exam 02/20/2018  Orientation to time 5  Orientation to Place 5  Registration 3  Attention/ Calculation 5  Recall 3  Language- name 2 objects 2  Language- repeat 1  Language- follow 3 step command 3  Language- read & follow direction 1  Write a sentence 1  Copy design 1  Total score 30    Fall Risk  02/26/2019 02/26/2019 11/24/2018 08/21/2018 02/20/2018  Falls in the past year? 1 1 0 0 No  Number falls in past yr: 0 0 - 0 -  Injury with Fall? 1 1 - 0 -      LABS: Recent Results (from the past 2160 hour(s))  UA/M w/rflx Culture, Routine     Status: Abnormal   Collection Time: 02/26/19 12:00 AM   Specimen: Urine   URINE  Result Value Ref Range   Specific Gravity, UA 1.024 1.005 - 1.030   pH, UA 6.0 5.0 - 7.5   Color, UA Yellow Yellow   Appearance Ur Clear Clear   Leukocytes,UA Trace (A) Negative   Protein,UA Trace Negative/Trace   Glucose, UA Negative Negative   Ketones, UA Negative Negative   RBC, UA Negative Negative   Bilirubin, UA Negative Negative   Urobilinogen, Ur 1.0 0.2 - 1.0 mg/dL   Nitrite, UA Negative Negative   Microscopic Examination See below:     Comment: Microscopic was indicated and was performed.   Urinalysis Reflex Comment     Comment: This specimen has reflexed to a Urine Culture.  Microscopic Examination     Status: Abnormal   Collection Time: 02/26/19 12:00 AM   URINE  Result Value Ref Range   WBC, UA 0-5 0 - 5 /hpf   RBC None seen 0 - 2 /hpf   Epithelial Cells (non renal) >10 (A) 0 - 10 /hpf   Casts None seen None seen /lpf   Mucus, UA Present Not Estab.   Bacteria, UA Few None seen/Few  Urine Culture, Reflex     Status: None   Collection Time: 02/26/19 12:00 AM   URINE  Result Value Ref Range  Urine Culture, Routine Final report    Organism ID, Bacteria Comment     Comment: Greater than 2 organisms recovered, none predominant. Please submit another sample if clinically indicated. Greater than 100,000 colony forming units per  mL     Assessment/Plan: 1. Encounter for general adult medical examination with abnormal findings Annual health maintenance exam today.   2. Hypertension, unspecified type Blood pressure stable. Continue bp medication as prescribed. Will get routine, fasting labs  - carvedilol (COREG) 25 MG tablet; Take 1 tablet (25 mg total) by mouth daily with lunch.  Dispense: 90 tablet; Refill: 3 - losartan-hydrochlorothiazide (HYZAAR) 100-25 MG tablet; Take 1 tablet by mouth daily with lunch.  Dispense: 30 tablet; Refill: 5  3. Acute right ankle pain Improving. Ice, rest, and elevate the right foot when possible. Use ace bandage or ankle sleeve for further evaluation.   4. Hypokalemia Check routine, fasting labs and adjust treatmetn as indicated.   5. Dysuria - UA/M w/rflx Culture, Routine  General Counseling: Tiffany Schaefer verbalizes understanding of the findings of todays visit and agrees with plan of treatment. I have discussed any further diagnostic evaluation that may be needed or ordered today. We also reviewed her medications today. she has been encouraged to call the office with any questions or concerns that should arise related to todays visit.    Counseling:  This patient was seen by Leretha Pol FNP Collaboration with Dr Lavera Guise as a part of collaborative care agreement  Orders Placed This Encounter  Procedures  . Microscopic Examination  . Urine Culture, Reflex  . UA/M w/rflx Culture, Routine    Meds ordered this encounter  Medications  . carvedilol (COREG) 25 MG tablet    Sig: Take 1 tablet (25 mg total) by mouth daily with lunch.    Dispense:  90 tablet    Refill:  3    Order Specific Question:   Supervising Provider    Answer:   Lavera Guise [id number]  . losartan-hydrochlorothiazide (HYZAAR) 100-25 MG tablet    Sig: Take 1 tablet by mouth daily with lunch.    Dispense:  30 tablet    Refill:  5    Order Specific Question:   Supervising Provider    Answer:   Lavera Guise [id number]    Time spent: [place], MD  Internal Medicine

## 2019-03-01 LAB — UA/M W/RFLX CULTURE, ROUTINE
Bilirubin, UA: NEGATIVE
Glucose, UA: NEGATIVE
Ketones, UA: NEGATIVE
Nitrite, UA: NEGATIVE
RBC, UA: NEGATIVE
Specific Gravity, UA: 1.024 (ref 1.005–1.030)
Urobilinogen, Ur: 1 mg/dL (ref 0.2–1.0)
pH, UA: 6 (ref 5.0–7.5)

## 2019-03-01 LAB — MICROSCOPIC EXAMINATION
Casts: NONE SEEN /lpf
Epithelial Cells (non renal): >10 /hpf — AB (ref 0–10)
RBC, Urine: NONE SEEN /hpf (ref 0–2)

## 2019-03-01 LAB — URINE CULTURE, REFLEX

## 2019-03-14 DIAGNOSIS — R300 Dysuria: Secondary | ICD-10-CM | POA: Insufficient documentation

## 2019-03-14 DIAGNOSIS — Z0001 Encounter for general adult medical examination with abnormal findings: Secondary | ICD-10-CM | POA: Insufficient documentation

## 2019-03-14 DIAGNOSIS — M25571 Pain in right ankle and joints of right foot: Secondary | ICD-10-CM | POA: Insufficient documentation

## 2019-03-14 MED ORDER — CARVEDILOL 25 MG PO TABS: 25.0000 mg | ORAL_TABLET | Freq: Every day | ORAL | 3 refills | Status: DC

## 2019-03-14 MED ORDER — LOSARTAN POTASSIUM-HCTZ 100-25 MG PO TABS: 1.0000 | ORAL_TABLET | Freq: Every day | ORAL | 5 refills | Status: DC

## 2019-04-07 ENCOUNTER — Telehealth: Payer: Self-pay

## 2019-04-07 NOTE — Telephone Encounter (Signed)
We can see her back sooner, maybe after the holidays?

## 2019-04-13 NOTE — Telephone Encounter (Signed)
Pt appt was scheduled for 05/04/19

## 2019-04-26 ENCOUNTER — Other Ambulatory Visit: Payer: Self-pay | Admitting: Nurse Practitioner

## 2019-04-26 DIAGNOSIS — E559 Vitamin D deficiency, unspecified: Secondary | ICD-10-CM | POA: Diagnosis not present

## 2019-04-26 DIAGNOSIS — E782 Mixed hyperlipidemia: Secondary | ICD-10-CM | POA: Diagnosis not present

## 2019-04-26 DIAGNOSIS — I019 Acute rheumatic heart disease, unspecified: Secondary | ICD-10-CM | POA: Diagnosis not present

## 2019-04-26 DIAGNOSIS — Z0001 Encounter for general adult medical examination with abnormal findings: Secondary | ICD-10-CM | POA: Diagnosis not present

## 2019-04-27 LAB — CBC
Hematocrit: 41.7 % (ref 34.0–46.6)
Hemoglobin: 13.9 g/dL (ref 11.1–15.9)
MCH: 28.3 pg (ref 26.6–33.0)
MCHC: 33.3 g/dL (ref 31.5–35.7)
MCV: 85 fL (ref 79–97)
Platelets: 260 10*3/uL (ref 150–450)
RBC: 4.91 x10E6/uL (ref 3.77–5.28)
RDW: 15.3 % (ref 11.7–15.4)
WBC: 6.7 10*3/uL (ref 3.4–10.8)

## 2019-04-27 LAB — COMPREHENSIVE METABOLIC PANEL
ALT: 11 IU/L (ref 0–32)
AST: 15 IU/L (ref 0–40)
Albumin/Globulin Ratio: 0.8 — ABNORMAL LOW (ref 1.2–2.2)
Albumin: 3.5 g/dL — ABNORMAL LOW (ref 3.8–4.8)
Alkaline Phosphatase: 52 IU/L (ref 39–117)
BUN/Creatinine Ratio: 21 (ref 12–28)
BUN: 17 mg/dL (ref 8–27)
Bilirubin Total: 0.3 mg/dL (ref 0.0–1.2)
CO2: 25 mmol/L (ref 20–29)
Calcium: 9.2 mg/dL (ref 8.7–10.3)
Chloride: 102 mmol/L (ref 96–106)
Creatinine, Ser: 0.82 mg/dL (ref 0.57–1.00)
GFR calc Af Amer: 86 mL/min/{1.73_m2} (ref >59)
GFR calc non Af Amer: 75 mL/min/{1.73_m2} (ref >59)
Globulin, Total: 4.3 g/dL (ref 1.5–4.5)
Glucose: 95 mg/dL (ref 65–99)
Potassium: 3.4 mmol/L — ABNORMAL LOW (ref 3.5–5.2)
Sodium: 142 mmol/L (ref 134–144)
Total Protein: 7.8 g/dL (ref 6.0–8.5)

## 2019-04-27 LAB — LIPID PANEL W/O CHOL/HDL RATIO
Cholesterol, Total: 187 mg/dL (ref 100–199)
HDL: 49 mg/dL (ref >39)
LDL Chol Calc (NIH): 121 mg/dL — ABNORMAL HIGH (ref 0–99)
Triglycerides: 95 mg/dL (ref 0–149)
VLDL Cholesterol Cal: 17 mg/dL (ref 5–40)

## 2019-04-27 LAB — T4, FREE: Free T4: 1.22 ng/dL (ref 0.82–1.77)

## 2019-04-27 LAB — TSH: TSH: 1.59 u[IU]/mL (ref 0.450–4.500)

## 2019-04-27 LAB — VITAMIN D 25 HYDROXY (VIT D DEFICIENCY, FRACTURES): Vit D, 25-Hydroxy: 19.8 ng/mL — ABNORMAL LOW (ref 30.0–100.0)

## 2019-04-29 ENCOUNTER — Telehealth: Payer: Self-pay

## 2019-04-29 NOTE — Telephone Encounter (Signed)
CONFIRMED AND SCREENED FOR 05-04-19 OV.

## 2019-05-03 NOTE — Progress Notes (Signed)
Vitamin d deficiency. Review with patient at visit 05/04/2019

## 2019-05-04 ENCOUNTER — Other Ambulatory Visit: Payer: Self-pay

## 2019-05-04 ENCOUNTER — Encounter (INDEPENDENT_AMBULATORY_CARE_PROVIDER_SITE_OTHER): Payer: Self-pay

## 2019-05-04 ENCOUNTER — Ambulatory Visit (INDEPENDENT_AMBULATORY_CARE_PROVIDER_SITE_OTHER): Payer: Medicare Other | Admitting: Nurse Practitioner

## 2019-05-04 ENCOUNTER — Encounter: Payer: Self-pay | Admitting: Nurse Practitioner

## 2019-05-04 VITALS — BP 140/84 | HR 92 | Resp 16 | Ht 63.0 in | Wt 245.2 lb

## 2019-05-04 DIAGNOSIS — R600 Localized edema: Secondary | ICD-10-CM

## 2019-05-04 DIAGNOSIS — I70203 Unspecified atherosclerosis of native arteries of extremities, bilateral legs: Secondary | ICD-10-CM | POA: Diagnosis not present

## 2019-05-04 DIAGNOSIS — Z23 Encounter for immunization: Secondary | ICD-10-CM

## 2019-05-04 DIAGNOSIS — I10 Essential (primary) hypertension: Secondary | ICD-10-CM

## 2019-05-04 DIAGNOSIS — R609 Edema, unspecified: Secondary | ICD-10-CM | POA: Diagnosis not present

## 2019-05-04 NOTE — Progress Notes (Signed)
[facility] [place], [place] [address]  Internal MEDICINE  Office Visit Note  Patient Name: Tiffany Schaefer  [id number]  [id number]  Date of Service: 05/05/2019  Chief Complaint  Patient presents with  . Follow-up    per home health nurse, patient needs ultrasound of legs     Ms. Fogle presents for follow-up on result of in-home ABI evaluation. She reports occasional pain in bilateral LE's, and swelling which resolves with elevation. She had home evaluation per [employer]. She was found to have mild to moderate risk of circulatory disorder in bilateral lower legs. She should be evaluated further for periphearal arterial disease .she states she would like to get a flu shot while she is here.       Current Medication: Outpatient Encounter Medications as of 05/04/2019  Medication Sig  . albuterol (VENTOLIN HFA) 108 (90 Base) MCG/ACT inhaler Inhale 2 puffs into the lungs every 6 (six) hours as needed for wheezing or shortness of breath.  Marland Kitchen aspirin EC 81 MG tablet Take 81 mg by mouth daily with lunch.   . bisacodyl (DULCOLAX) 5 MG EC tablet Take 5 mg by mouth daily as needed for moderate constipation.  . carvedilol (COREG) 25 MG tablet Take 1 tablet (25 mg total) by mouth daily with lunch.  . furosemide (LASIX) 20 MG tablet Take 1 tablet (20 mg total) by mouth daily.  Marland Kitchen losartan-hydrochlorothiazide (HYZAAR) 100-25 MG tablet Take 1 tablet by mouth daily with lunch.  . lovastatin (MEVACOR) 40 MG tablet Take 1 tablet (40 mg total) by mouth at bedtime.  . potassium chloride SA (KLOR-CON) 20 MEQ tablet Take 1 tablet (20 mEq total) by mouth daily.  Marland Kitchen triamcinolone cream (KENALOG) 0.5 % Apply topically 2 (two) times daily.   No facility-administered encounter medications on file as of 05/04/2019.    Surgical History: Past Surgical History:  Procedure Laterality Date  . CESAREAN SECTION     denies 09/03/17  . COLONOSCOPY    . HEMORRHOID SURGERY    .  HYSTEROSCOPY WITH D & C N/A 09/11/2017   Procedure: DILATATION AND CURETTAGE /HYSTEROSCOPY;  Surgeon: Malachy Mood, MD;  Location: [facility] ORS;  Service: Gynecology;  Laterality: N/A;    Medical History: Past Medical History:  Diagnosis Date  . Arthritis   . Hyperlipidemia   . Hypertension   . Sinus infection   . Sleep apnea     Family History: Family History  Problem Relation Age of Onset  . Stroke Mother   . Diabetes Brother   . Rheumatic fever Brother   . Arthritis/Rheumatoid Brother     Social History   Socioeconomic History  . Marital status: Married    Spouse name: Not on file  . Number of children: Not on file  . Years of education: Not on file  . Highest education level: Not on file  Occupational History  . Not on file  Tobacco Use  . Smoking status: Current Every Day Smoker    Packs/day: 1.00    Years: 30.00    Pack years: 30.00    Types: Cigarettes  . Smokeless tobacco: Never Used  Substance and Sexual Activity  . Alcohol use: Yes    Alcohol/week: 0.0 standard drinks    Comment: occasionally  . Drug use: No  . Sexual activity: Not Currently    Birth control/protection: None  Other Topics Concern  . Not on file  Social History Narrative  . Not on file   Social  Determinants of Health   Financial Resource Strain:   . Difficulty of Paying Living Expenses: Not on file  Food Insecurity:   . Worried About Charity fundraiser in the Last Year: Not on file  . Ran Out of Food in the Last Year: Not on file  Transportation Needs:   . Lack of Transportation (Medical): Not on file  . Lack of Transportation (Non-Medical): Not on file  Physical Activity:   . Days of Exercise per Week: Not on file  . Minutes of Exercise per Session: Not on file  Stress:   . Feeling of Stress : Not on file  Social Connections:   . Frequency of Communication with Friends and Family: Not on file  . Frequency of Social Gatherings with Friends and Family: Not on file  .  Attends Religious Services: Not on file  . Active Member of Clubs or Organizations: Not on file  . Attends Archivist Meetings: Not on file  . Marital Status: Not on file  Intimate Partner Violence:   . Fear of Current or Ex-Partner: Not on file  . Emotionally Abused: Not on file  . Physically Abused: Not on file  . Sexually Abused: Not on file      Review of Systems  Constitutional: Negative for activity change, chills, fatigue and unexpected weight change.  HENT: Negative for congestion, postnasal drip, rhinorrhea, sneezing and sore throat.   Respiratory: Negative for cough, chest tightness, shortness of breath and wheezing.   Cardiovascular: Negative for chest pain and palpitations.       Mild, bilateral lower extremity edema which resolves with elevation and rest.   Gastrointestinal: Negative for abdominal pain, constipation, diarrhea, nausea and vomiting.  Endocrine: Negative for cold intolerance, heat intolerance, polydipsia and polyuria.  Musculoskeletal: Negative for arthralgias, back pain, joint swelling and neck pain.  Skin: Negative for rash.  Allergic/Immunologic: Negative for environmental allergies.  Neurological: Negative for dizziness, tremors, numbness and headaches.  Hematological: Negative for adenopathy. Does not bruise/bleed easily.  Psychiatric/Behavioral: Negative for behavioral problems (Depression), sleep disturbance and suicidal ideas. The patient is not nervous/anxious.     Today's Vitals   05/04/19 0906  BP: 140/84  Pulse: 92  Resp: 16  SpO2: 99%  Weight: 245 lb 3.2 oz (111.2 kg)  Height: 5\' 3"  (1.6 m)   Body mass index is 43.44 kg/m.  Physical Exam Vitals and nursing note reviewed.  Constitutional:      General: She is not in acute distress.    Appearance: Normal appearance. She is well-developed. She is obese. She is not diaphoretic.  HENT:     Head: Normocephalic and atraumatic.     Mouth/Throat:     Pharynx: No oropharyngeal  exudate.  Eyes:     Pupils: Pupils are equal, round, and reactive to light.  Neck:     Thyroid: No thyromegaly.     Vascular: No JVD.     Trachea: No tracheal deviation.  Cardiovascular:     Rate and Rhythm: Normal rate and regular rhythm.     Pulses: Normal pulses.     Heart sounds: Normal heart sounds. No murmur. No friction rub. No gallop.   Pulmonary:     Effort: Pulmonary effort is normal. No respiratory distress.     Breath sounds: Normal breath sounds. No wheezing or rales.  Chest:     Chest wall: No tenderness.  Abdominal:     Palpations: Abdomen is soft.  Musculoskeletal:  General: Normal range of motion.     Cervical back: Normal range of motion and neck supple.  Lymphadenopathy:     Cervical: No cervical adenopathy.  Skin:    General: Skin is warm and dry.     Capillary Refill: Capillary refill takes 2 to 3 seconds.  Neurological:     Mental Status: She is alert and oriented to person, place, and time.     Cranial Nerves: No cranial nerve deficit.  Psychiatric:        Behavior: Behavior normal.        Thought Content: Thought content normal.        Judgment: Judgment normal.    Assessment/Plan: 1. Hypertension, unspecified type Stable. Continue bp medication as prescribed   2. Peripheral edema Improved. Will continue  To monitor  3. Atherosclerosis of artery of both lower extremities ([place]) HouseCalls visit per [employer] showing risk for peripheral artery disease at mild to moderate. Will get arterial duplex study with ABI for further evaluation.  - VAS [place] LOWER EXTREMITY ARTERIAL DUPLEX; Future  4. Flu vaccine need Flu vaccine administered in the office today. - Flu Vaccine MDCK QUAD PF  General Counseling: Sharrell verbalizes understanding of the findings of todays visit and agrees with plan of treatment. I have discussed any further diagnostic evaluation that may be needed or ordered today. We also reviewed her medications today. she has been  encouraged to call the office with any questions or concerns that should arise related to todays visit.  This patient was seen by Leretha Pol FNP Collaboration with Dr Lavera Guise as a part of collaborative care agreement  Orders Placed This Encounter  Procedures  . Flu Vaccine MDCK QUAD PF  . VAS [place] LOWER EXTREMITY ARTERIAL DUPLEX     Time spent: 25 Minutes      Dr Lavera Guise Internal medicine

## 2019-05-05 DIAGNOSIS — Z23 Encounter for immunization: Secondary | ICD-10-CM | POA: Insufficient documentation

## 2019-05-05 DIAGNOSIS — I70203 Unspecified atherosclerosis of native arteries of extremities, bilateral legs: Secondary | ICD-10-CM | POA: Insufficient documentation

## 2019-05-11 ENCOUNTER — Other Ambulatory Visit: Payer: Self-pay | Admitting: Adult Health

## 2019-05-11 DIAGNOSIS — I70203 Unspecified atherosclerosis of native arteries of extremities, bilateral legs: Secondary | ICD-10-CM

## 2019-05-19 ENCOUNTER — Telehealth: Payer: Self-pay

## 2019-05-19 NOTE — Telephone Encounter (Signed)
Confirmed appointment with patient and screened for covid. klh 

## 2019-05-21 ENCOUNTER — Ambulatory Visit: Payer: Medicare Other

## 2019-05-21 ENCOUNTER — Other Ambulatory Visit: Payer: Self-pay

## 2019-05-21 DIAGNOSIS — I70203 Unspecified atherosclerosis of native arteries of extremities, bilateral legs: Secondary | ICD-10-CM

## 2019-05-25 NOTE — Progress Notes (Signed)
Mild plaque in bilateral lower extremity vessels. Borderline flow. Continue to monitor.

## 2019-05-25 NOTE — Progress Notes (Signed)
Borderline study. Some plaque in the vessels. Continue to monitor.

## 2019-05-27 NOTE — Progress Notes (Signed)
Borderline results. Patient is asymptomatic

## 2019-06-01 ENCOUNTER — Telehealth: Payer: Self-pay

## 2019-06-01 NOTE — Telephone Encounter (Signed)
Confirmed telephone visit with patient.klh 

## 2019-06-03 ENCOUNTER — Ambulatory Visit (INDEPENDENT_AMBULATORY_CARE_PROVIDER_SITE_OTHER): Payer: Medicare Other | Admitting: Adult Health

## 2019-06-03 ENCOUNTER — Other Ambulatory Visit: Payer: Self-pay

## 2019-06-03 ENCOUNTER — Encounter: Payer: Self-pay | Admitting: Adult Health

## 2019-06-03 VITALS — Ht 63.0 in | Wt 245.0 lb

## 2019-06-03 DIAGNOSIS — I10 Essential (primary) hypertension: Secondary | ICD-10-CM | POA: Diagnosis not present

## 2019-06-03 DIAGNOSIS — I70203 Unspecified atherosclerosis of native arteries of extremities, bilateral legs: Secondary | ICD-10-CM

## 2019-06-03 DIAGNOSIS — R609 Edema, unspecified: Secondary | ICD-10-CM | POA: Diagnosis not present

## 2019-06-03 NOTE — Progress Notes (Signed)
Tiffany Schaefer [place], [place] [address]  Internal MEDICINE  Telephone Visit  Patient Name: Tiffany Schaefer  [id number]  [id number]  Date of Service: 06/03/2019  I connected with the patient at 344 by telephone and verified the patients identity using two identifiers.   I discussed the limitations, risks, security and privacy concerns of performing an evaluation and management service by telephone and the availability of in person appointments. I also discussed with the patient that there may be a patient responsible charge related to the service.  The patient expressed understanding and agrees to proceed.    Chief Complaint  Patient presents with  . Telephone Assessment  . Telephone Screen  . Follow-up    U/S    HPI Pt seen via telephone. Pt seen for follow up on vascular US.  Results reviewed with patient at this time.  US Shows, Mild arterial insufficiency in right leg. Pt is asymptomatic at this time.  Will plan to monitor again in 1 year.     Current Medication: Outpatient Encounter Medications as of 06/03/2019  Medication Sig  . albuterol (VENTOLIN HFA) 108 (90 Base) MCG/ACT inhaler Inhale 2 puffs into the lungs every 6 (six) hours as needed for wheezing or shortness of breath.  Marland Kitchen aspirin EC 81 MG tablet Take 81 mg by mouth daily with lunch.   . bisacodyl (DULCOLAX) 5 MG EC tablet Take 5 mg by mouth daily as needed for moderate constipation.  . carvedilol (COREG) 25 MG tablet Take 1 tablet (25 mg total) by mouth daily with lunch.  . furosemide (LASIX) 20 MG tablet Take 1 tablet (20 mg total) by mouth daily.  Marland Kitchen losartan-hydrochlorothiazide (HYZAAR) 100-25 MG tablet Take 1 tablet by mouth daily with lunch.  . lovastatin (MEVACOR) 40 MG tablet Take 1 tablet (40 mg total) by mouth at bedtime.  . potassium chloride SA (KLOR-CON) 20 MEQ tablet Take 1 tablet (20 mEq total) by mouth daily.  Marland Kitchen triamcinolone cream (KENALOG) 0.5 % Apply topically 2 (two) times daily.    No facility-administered encounter medications on file as of 06/03/2019.    Surgical History: Past Surgical History:  Procedure Laterality Date  . CESAREAN SECTION     denies 09/03/17  . COLONOSCOPY    . HEMORRHOID SURGERY    . HYSTEROSCOPY WITH D & C N/A 09/11/2017   Procedure: DILATATION AND CURETTAGE /HYSTEROSCOPY;  Surgeon: Malachy Mood, MD;  Location: [facility] ORS;  Service: Gynecology;  Laterality: N/A;    Medical History: Past Medical History:  Diagnosis Date  . Arthritis   . Hyperlipidemia   . Hypertension   . Sinus infection   . Sleep apnea     Family History: Family History  Problem Relation Age of Onset  . Stroke Mother   . Diabetes Brother   . Rheumatic fever Brother   . Arthritis/Rheumatoid Brother     Social History   Socioeconomic History  . Marital status: Married    Spouse name: Not on file  . Number of children: Not on file  . Years of education: Not on file  . Highest education level: Not on file  Occupational History  . Not on file  Tobacco Use  . Smoking status: Current Every Day Smoker    Packs/day: 1.00    Years: 30.00    Pack years: 30.00    Types: Cigarettes  . Smokeless tobacco: Never Used  Substance and Sexual Activity  . Alcohol use: Yes    Alcohol/week: 0.0  standard drinks    Comment: occasionally  . Drug use: No  . Sexual activity: Not Currently    Birth control/protection: None  Other Topics Concern  . Not on file  Social History Narrative  . Not on file   Social Determinants of Health   Financial Resource Strain:   . Difficulty of Paying Living Expenses: Not on file  Food Insecurity:   . Worried About Charity fundraiser in the Last Year: Not on file  . Ran Out of Food in the Last Year: Not on file  Transportation Needs:   . Lack of Transportation (Medical): Not on file  . Lack of Transportation (Non-Medical): Not on file  Physical Activity:   . Days of Exercise per Week: Not on file  . Minutes of Exercise per  Session: Not on file  Stress:   . Feeling of Stress : Not on file  Social Connections:   . Frequency of Communication with Friends and Family: Not on file  . Frequency of Social Gatherings with Friends and Family: Not on file  . Attends Religious Services: Not on file  . Active Member of Clubs or Organizations: Not on file  . Attends Archivist Meetings: Not on file  . Marital Status: Not on file  Intimate Partner Violence:   . Fear of Current or Ex-Partner: Not on file  . Emotionally Abused: Not on file  . Physically Abused: Not on file  . Sexually Abused: Not on file      Review of Systems  Constitutional: Negative for chills, fatigue and unexpected weight change.  HENT: Negative for congestion, rhinorrhea, sneezing and sore throat.   Eyes: Negative for photophobia, pain and redness.  Respiratory: Negative for cough, chest tightness and shortness of breath.   Cardiovascular: Negative for chest pain and palpitations.  Gastrointestinal: Negative for abdominal pain, constipation, diarrhea, nausea and vomiting.  Endocrine: Negative.   Genitourinary: Negative for dysuria and frequency.  Musculoskeletal: Negative for arthralgias, back pain, joint swelling and neck pain.  Skin: Negative for rash.  Allergic/Immunologic: Negative.   Neurological: Negative for tremors and numbness.  Hematological: Negative for adenopathy. Does not bruise/bleed easily.  Psychiatric/Behavioral: Negative for behavioral problems and sleep disturbance. The patient is not nervous/anxious.     Vital Signs: Ht 5\' 3"  (1.6 m)   Wt 245 lb (111.1 kg)   BMI 43.40 kg/m    Observation/Objective:  Well appearing, NAD noted.    Assessment/Plan: 1. Atherosclerosis of artery of both lower extremities ([facility]) Stable, mild insufficiency. Repeat Vascular US in one year.   2. Hypertension, unspecified type Stable, continue current meds  3. Peripheral edema Stable, continue present management.    General Counseling: [name] verbalizes understanding of the findings of today's phone visit and agrees with plan of treatment. I have discussed any further diagnostic evaluation that may be needed or ordered today. We also reviewed her medications today. she has been encouraged to call the office with any questions or concerns that should arise related to todays visit.    No orders of the defined types were placed in this encounter.   No orders of the defined types were placed in this encounter.   Time spent: [place] AGNP-C Internal medicine

## 2019-06-21 ENCOUNTER — Other Ambulatory Visit: Payer: Self-pay

## 2019-06-21 DIAGNOSIS — I10 Essential (primary) hypertension: Secondary | ICD-10-CM

## 2019-06-21 MED ORDER — CARVEDILOL 25 MG PO TABS: 25.0000 mg | ORAL_TABLET | Freq: Every day | ORAL | 1 refills | Status: DC

## 2019-08-19 ENCOUNTER — Other Ambulatory Visit: Payer: Self-pay

## 2019-08-19 DIAGNOSIS — R609 Edema, unspecified: Secondary | ICD-10-CM

## 2019-08-19 DIAGNOSIS — I10 Essential (primary) hypertension: Secondary | ICD-10-CM

## 2019-08-19 MED ORDER — FUROSEMIDE 20 MG PO TABS: 20.0000 mg | ORAL_TABLET | Freq: Every day | ORAL | 1 refills | Status: DC

## 2019-08-19 MED ORDER — LOSARTAN POTASSIUM-HCTZ 100-25 MG PO TABS: 1.0000 | ORAL_TABLET | Freq: Every day | ORAL | 1 refills | Status: DC

## 2019-08-25 ENCOUNTER — Telehealth: Payer: Self-pay

## 2019-08-25 NOTE — Telephone Encounter (Signed)
Confirmed and screened for 08-27-19 ov. °

## 2019-08-27 ENCOUNTER — Other Ambulatory Visit: Payer: Self-pay

## 2019-08-27 ENCOUNTER — Ambulatory Visit (INDEPENDENT_AMBULATORY_CARE_PROVIDER_SITE_OTHER): Payer: Medicare Other | Admitting: Nurse Practitioner

## 2019-08-27 ENCOUNTER — Encounter: Payer: Self-pay | Admitting: Nurse Practitioner

## 2019-08-27 VITALS — BP 134/75 | HR 92 | Temp 96.9°F | Resp 16 | Ht 63.0 in | Wt 241.4 lb

## 2019-08-27 DIAGNOSIS — I70203 Unspecified atherosclerosis of native arteries of extremities, bilateral legs: Secondary | ICD-10-CM

## 2019-08-27 DIAGNOSIS — E782 Mixed hyperlipidemia: Secondary | ICD-10-CM | POA: Diagnosis not present

## 2019-08-27 DIAGNOSIS — Z1231 Encounter for screening mammogram for malignant neoplasm of breast: Secondary | ICD-10-CM | POA: Diagnosis not present

## 2019-08-27 DIAGNOSIS — I10 Essential (primary) hypertension: Secondary | ICD-10-CM

## 2019-08-27 DIAGNOSIS — E876 Hypokalemia: Secondary | ICD-10-CM | POA: Diagnosis not present

## 2019-08-27 NOTE — Progress Notes (Signed)
[facility] [place], [place] [address]  Internal MEDICINE  Office Visit Note  Patient Name: Tiffany Schaefer  [id number]  [id number]  Date of Service: 09/13/2019  Chief Complaint  Patient presents with  . Follow-up  . Hypertension  . Hyperlipidemia    The patient is here for routine follow up visit. Her blood pressure is well managed. She has no concerns or complaints. She had routine, fasting labs done in 06/2019. She has very mild elevation of LDL with the remaining lipid panel being normal. She does have mild/moderate vitamin d deficiency. [place] potassium is mildly low, but stable at 3.4. she is due for screening mammogram.       Current Medication: Outpatient Encounter Medications as of 08/27/2019  Medication Sig  . albuterol (VENTOLIN HFA) 108 (90 Base) MCG/ACT inhaler Inhale 2 puffs into the lungs every 6 (six) hours as needed for wheezing or shortness of breath.  Marland Kitchen aspirin EC 81 MG tablet Take 81 mg by mouth daily with lunch.   . carvedilol (COREG) 25 MG tablet Take 1 tablet (25 mg total) by mouth daily with lunch.  . furosemide (LASIX) 20 MG tablet Take 1 tablet (20 mg total) by mouth daily.  Marland Kitchen losartan-hydrochlorothiazide (HYZAAR) 100-25 MG tablet Take 1 tablet by mouth daily with lunch.  . lovastatin (MEVACOR) 40 MG tablet Take 1 tablet (40 mg total) by mouth at bedtime.  . potassium chloride SA (KLOR-CON) 20 MEQ tablet Take 1 tablet (20 mEq total) by mouth daily.  Marland Kitchen triamcinolone cream (KENALOG) 0.5 % Apply topically 2 (two) times daily.  . bisacodyl (DULCOLAX) 5 MG EC tablet Take 5 mg by mouth daily as needed for moderate constipation.   No facility-administered encounter medications on file as of 08/27/2019.    Surgical History: Past Surgical History:  Procedure Laterality Date  . CESAREAN SECTION     denies 09/03/17  . COLONOSCOPY    . HEMORRHOID SURGERY    . HYSTEROSCOPY WITH D & C N/A 09/11/2017   Procedure: DILATATION AND CURETTAGE  /HYSTEROSCOPY;  Surgeon: Malachy Mood, MD;  Location: [facility] ORS;  Service: Gynecology;  Laterality: N/A;    Medical History: Past Medical History:  Diagnosis Date  . Arthritis   . Hyperlipidemia   . Hypertension   . Sinus infection   . Sleep apnea     Family History: Family History  Problem Relation Age of Onset  . Stroke Mother   . Diabetes Brother   . Rheumatic fever Brother   . Arthritis/Rheumatoid Brother     Social History   Socioeconomic History  . Marital status: Married    Spouse name: Not on file  . Number of children: Not on file  . Years of education: Not on file  . Highest education level: Not on file  Occupational History  . Not on file  Tobacco Use  . Smoking status: Current Every Day Smoker    Packs/day: 1.00    Years: 30.00    Pack years: 30.00    Types: Cigarettes  . Smokeless tobacco: Never Used  Substance and Sexual Activity  . Alcohol use: Yes    Alcohol/week: 0.0 standard drinks    Comment: occasionally  . Drug use: No  . Sexual activity: Not Currently    Birth control/protection: None  Other Topics Concern  . Not on file  Social History Narrative  . Not on file   Social Determinants of Health   Financial Resource Strain:   . Difficulty  of Paying Living Expenses:   Food Insecurity:   . Worried About Charity fundraiser in the Last Year:   . Arboriculturist in the Last Year:   Transportation Needs:   . Film/video editor (Medical):   Marland Kitchen Lack of Transportation (Non-Medical):   Physical Activity:   . Days of Exercise per Week:   . Minutes of Exercise per Session:   Stress:   . Feeling of Stress :   Social Connections:   . Frequency of Communication with Friends and Family:   . Frequency of Social Gatherings with Friends and Family:   . Attends Religious Services:   . Active Member of Clubs or Organizations:   . Attends Archivist Meetings:   Marland Kitchen Marital Status:   Intimate Partner Violence:   . Fear of Current  or Ex-Partner:   . Emotionally Abused:   Marland Kitchen Physically Abused:   . Sexually Abused:       Review of Systems  Constitutional: Negative for activity change, chills, fatigue and unexpected weight change.  HENT: Negative for congestion, postnasal drip, rhinorrhea, sneezing and sore throat.   Respiratory: Negative for cough, chest tightness, shortness of breath and wheezing.   Cardiovascular: Negative for chest pain and palpitations.       Mild, bilateral lower extremity edema which resolves with elevation and rest.   Gastrointestinal: Negative for abdominal pain, constipation, diarrhea, nausea and vomiting.  Endocrine: Negative for cold intolerance, heat intolerance, polydipsia and polyuria.  Musculoskeletal: Negative for arthralgias, back pain, joint swelling and neck pain.  Skin: Negative for rash.  Allergic/Immunologic: Negative for environmental allergies.  Neurological: Negative for dizziness, tremors, numbness and headaches.  Hematological: Negative for adenopathy. Does not bruise/bleed easily.  Psychiatric/Behavioral: Negative for behavioral problems (Depression), sleep disturbance and suicidal ideas. The patient is not nervous/anxious.     Today's Vitals   08/27/19 0831  BP: 134/75  Pulse: 92  Resp: 16  Temp: (!) 96.9 F (36.1 C)  SpO2: 97%  Weight: 241 lb 6.4 oz (109.5 kg)  Height: 5\' 3"  (1.6 m)   Body mass index is 42.76 kg/m.  Physical Exam Vitals and nursing note reviewed.  Constitutional:      General: She is not in acute distress.    Appearance: Normal appearance. She is well-developed. She is obese. She is not diaphoretic.  HENT:     Head: Normocephalic and atraumatic.     Mouth/Throat:     Pharynx: No oropharyngeal exudate.  Eyes:     Pupils: Pupils are equal, round, and reactive to light.  Neck:     Thyroid: No thyromegaly.     Vascular: No JVD.     Trachea: No tracheal deviation.  Cardiovascular:     Rate and Rhythm: Normal rate and regular rhythm.      Pulses: Normal pulses.     Heart sounds: Normal heart sounds. No murmur. No friction rub. No gallop.      Comments: [place], non pitting edema in left ankle.  Pulmonary:     Effort: Pulmonary effort is normal. No respiratory distress.     Breath sounds: Normal breath sounds. No wheezing or rales.  Chest:     Chest wall: No tenderness.  Abdominal:     Palpations: Abdomen is soft.  Musculoskeletal:        General: Normal range of motion.     Cervical back: Normal range of motion and neck supple.  Lymphadenopathy:     Cervical: No cervical  adenopathy.  Skin:    General: Skin is warm and dry.     Capillary Refill: Capillary refill takes 2 to 3 seconds.  Neurological:     Mental Status: She is alert and oriented to person, place, and time.     Cranial Nerves: No cranial nerve deficit.  Psychiatric:        Behavior: Behavior normal.        Thought Content: Thought content normal.        Judgment: Judgment normal.    Assessment/Plan: 1. Hypertension, unspecified type Stable. Continue bp medication as prescribed   2. Atherosclerosis of artery of both lower extremities ([place]) Reviewed results of LE arterial duplex showing mild peripheral arterial stenosis. Will continue to monitor yearly.   3. Hypokalemia Potassium 3.4 today. Continue oral potassium 35mWq daily.   4. Mixed hyperlipidemia Stable. Continue lovastatin as prescribed   5. Encounter for screening mammogram for malignant neoplasm of breast - MM DIGITAL SCREENING BILATERAL; Future  General Counseling: Tiffany Schaefer verbalizes understanding of the findings of todays visit and agrees with plan of treatment. I have discussed any further diagnostic evaluation that may be needed or ordered today. We also reviewed her medications today. she has been encouraged to call the office with any questions or concerns that should arise related to todays visit.    Orders Placed This Encounter  Procedures  . MM DIGITAL SCREENING BILATERAL    Hypertension Counseling:   The following hypertensive lifestyle modification were recommended and discussed:  1. Limiting alcohol intake to less than 1 oz/day of ethanol:(24 oz of beer or 8 oz of wine or 2 oz of 100-proof whiskey). 2. Take baby ASA 81 mg daily. 3. Importance of regular aerobic exercise and losing weight. 4. Reduce dietary saturated fat and cholesterol intake for overall cardiovascular health. 5. Maintaining adequate dietary potassium, calcium, and magnesium intake. 6. Regular monitoring of the blood pressure. 7. Reduce sodium intake to less than 100 mmol/day (less than 2.3 gm of sodium or less than 6 gm of sodium choride)   This patient was seen by [place] with Dr Lavera Guise as a part of collaborative care agreement  Total time spent: 25 Minutes   Time spent includes review of chart, medications, test results, and follow up plan with the patient.      Dr Lavera Guise Internal medicine

## 2019-09-20 ENCOUNTER — Other Ambulatory Visit: Payer: Self-pay

## 2019-09-20 DIAGNOSIS — E876 Hypokalemia: Secondary | ICD-10-CM

## 2019-09-20 MED ORDER — POTASSIUM CHLORIDE CRYS ER 20 MEQ PO TBCR: 20.0000 meq | EXTENDED_RELEASE_TABLET | Freq: Every day | ORAL | 5 refills | Status: DC

## 2019-11-29 ENCOUNTER — Other Ambulatory Visit: Payer: Self-pay | Admitting: Adult Health

## 2019-11-29 DIAGNOSIS — R609 Edema, unspecified: Secondary | ICD-10-CM

## 2019-11-29 DIAGNOSIS — I10 Essential (primary) hypertension: Secondary | ICD-10-CM

## 2019-11-29 DIAGNOSIS — E782 Mixed hyperlipidemia: Secondary | ICD-10-CM

## 2019-11-29 MED ORDER — CARVEDILOL 25 MG PO TABS: 25.0000 mg | ORAL_TABLET | Freq: Every day | ORAL | 1 refills | Status: DC

## 2019-11-29 MED ORDER — LOVASTATIN 40 MG PO TABS: 40.0000 mg | ORAL_TABLET | Freq: Every day | ORAL | 3 refills | Status: DC

## 2019-12-30 DIAGNOSIS — Z1231 Encounter for screening mammogram for malignant neoplasm of breast: Secondary | ICD-10-CM | POA: Diagnosis not present

## 2020-02-16 ENCOUNTER — Other Ambulatory Visit: Payer: Self-pay

## 2020-02-16 DIAGNOSIS — E876 Hypokalemia: Secondary | ICD-10-CM

## 2020-02-16 MED ORDER — POTASSIUM CHLORIDE CRYS ER 20 MEQ PO TBCR
20.0000 meq | EXTENDED_RELEASE_TABLET | Freq: Every day | ORAL | 5 refills | Status: DC
Start: 2020-02-16 — End: 2020-03-02

## 2020-02-25 ENCOUNTER — Ambulatory Visit: Payer: Medicare Other | Admitting: Nurse Practitioner

## 2020-02-28 ENCOUNTER — Ambulatory Visit: Payer: Medicare Other | Admitting: Nurse Practitioner

## 2020-03-02 ENCOUNTER — Ambulatory Visit (INDEPENDENT_AMBULATORY_CARE_PROVIDER_SITE_OTHER): Payer: Medicare Other | Admitting: Nurse Practitioner

## 2020-03-02 ENCOUNTER — Encounter: Payer: Self-pay | Admitting: Nurse Practitioner

## 2020-03-02 ENCOUNTER — Other Ambulatory Visit: Payer: Self-pay

## 2020-03-02 VITALS — BP 133/86 | HR 107 | Temp 97.5°F | Resp 16 | Ht 63.0 in | Wt 235.0 lb

## 2020-03-02 DIAGNOSIS — Z0001 Encounter for general adult medical examination with abnormal findings: Secondary | ICD-10-CM | POA: Diagnosis not present

## 2020-03-02 DIAGNOSIS — E876 Hypokalemia: Secondary | ICD-10-CM | POA: Diagnosis not present

## 2020-03-02 DIAGNOSIS — E782 Mixed hyperlipidemia: Secondary | ICD-10-CM

## 2020-03-02 DIAGNOSIS — I70203 Unspecified atherosclerosis of native arteries of extremities, bilateral legs: Secondary | ICD-10-CM | POA: Diagnosis not present

## 2020-03-02 DIAGNOSIS — Z23 Encounter for immunization: Secondary | ICD-10-CM

## 2020-03-02 DIAGNOSIS — I10 Essential (primary) hypertension: Secondary | ICD-10-CM | POA: Diagnosis not present

## 2020-03-02 MED ORDER — POTASSIUM CHLORIDE CRYS ER 20 MEQ PO TBCR: 20.0000 meq | EXTENDED_RELEASE_TABLET | Freq: Every day | ORAL | 3 refills | Status: DC

## 2020-03-02 MED ORDER — LOVASTATIN 40 MG PO TABS: 40.0000 mg | ORAL_TABLET | Freq: Every day | ORAL | 3 refills | Status: DC

## 2020-03-02 NOTE — Progress Notes (Signed)
[facility] [place],  [address]  Internal MEDICINE  Office Visit Note  Patient Name: Tiffany Schaefer  [id number]  [id number]  Date of Service: 03/22/2020   Pt is here for routine health maintenance examination  Chief Complaint  Patient presents with  . Medicare Wellness  . Hyperlipidemia  . Hypertension  . Quality Metric Gaps    colonoscopy,tetnaus,Hep C  . Medication Refill     The patient is here for health maintenance exam. Blood pressure is well managed. She did hust get new glasses and is having trouble getting used to them. She had screening mammogram in 12/2019 which was benign. Will get bone density test with mammogram next year. She is due for colon cancer screening. She has cologuard kit which she needs to do and send in for analysis. 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 Did see audiologist. They were unable to guarantee that ringing in ears would go away. Told her it might amplify the ringing. She has not gone back for trial of hearing aids.     Current Medication: Outpatient Encounter Medications as of 03/02/2020  Medication Sig  . albuterol (VENTOLIN HFA) 108 (90 Base) MCG/ACT inhaler Inhale 2 puffs into the lungs every 6 (six) hours as needed for wheezing or shortness of breath.  Marland Kitchen aspirin EC 81 MG tablet Take 81 mg by mouth daily with lunch.   . bisacodyl (DULCOLAX) 5 MG EC tablet Take 5 mg by mouth daily as needed for moderate constipation.  . carvedilol (COREG) 25 MG tablet Take 1 tablet (25 mg total) by mouth daily with lunch.  . furosemide (LASIX) 20 MG tablet TAKE ONE TABLET BY MOUTH ONCE DAILY  . losartan-hydrochlorothiazide (HYZAAR) 100-25 MG tablet TAKE ONE TABLET BY MOUTH ONCE DAILY WITH LUNCH  . lovastatin (MEVACOR) 40 MG tablet Take 1 tablet (40 mg total) by mouth at bedtime.  . potassium chloride SA (KLOR-CON) 20 MEQ tablet Take 1 tablet (20 mEq total) by mouth daily.  Marland Kitchen triamcinolone  cream (KENALOG) 0.5 % Apply topically 2 (two) times daily.  . [DISCONTINUED] lovastatin (MEVACOR) 40 MG tablet Take 1 tablet (40 mg total) by mouth at bedtime.  . [DISCONTINUED] potassium chloride SA (KLOR-CON) 20 MEQ tablet Take 1 tablet (20 mEq total) by mouth daily.   No facility-administered encounter medications on file as of 03/02/2020.    Surgical History: Past Surgical History:  Procedure Laterality Date  . CESAREAN SECTION     denies 09/03/17  . COLONOSCOPY    . HEMORRHOID SURGERY    . HYSTEROSCOPY WITH D & C N/A 09/11/2017   Procedure: DILATATION AND CURETTAGE /HYSTEROSCOPY;  Surgeon: Malachy Mood, MD;  Location: [facility] ORS;  Service: Gynecology;  Laterality: N/A;    Medical History: Past Medical History:  Diagnosis Date  . Arthritis   . Hyperlipidemia   . Hypertension   . Sinus infection   . Sleep apnea     Family History: Family History  Problem Relation Age of Onset  . Stroke Mother   . Diabetes Brother   . Rheumatic fever Brother   . Arthritis/Rheumatoid Brother       Review of Systems  Constitutional: Negative for activity change, chills, fatigue and unexpected weight change.  HENT: Positive for hearing loss and tinnitus. Negative for congestion, postnasal drip, rhinorrhea, sneezing and sore throat.   Respiratory: Negative for cough, chest tightness, shortness of breath and wheezing.   Cardiovascular: Negative for chest pain  and palpitations.       Mild, bilateral lower extremity edema which resolves with elevation and rest.   Gastrointestinal: Negative for abdominal pain, constipation, diarrhea, nausea and vomiting.  Endocrine: Negative for cold intolerance, heat intolerance, polydipsia and polyuria.  Genitourinary: Negative for dysuria, frequency and hematuria.  Musculoskeletal: Negative for arthralgias, back pain, joint swelling and neck pain.  Skin: Negative for rash.  Allergic/Immunologic: Negative for environmental allergies.  Neurological:  Negative for dizziness, tremors, numbness and headaches.  Hematological: Negative for adenopathy. Does not bruise/bleed easily.  Psychiatric/Behavioral: Negative for behavioral problems (Depression), sleep disturbance and suicidal ideas. The patient is not nervous/anxious.      Today's Vitals   03/02/20 0939  BP: 133/86  Pulse: (!) 107  Resp: 16  Temp: (!) 97.5 F (36.4 C)  SpO2: 94%  Weight: 235 lb (106.6 kg)  Height: '5\' 3"'  (1.6 m)   Body mass index is 41.63 kg/m.  Physical Exam Vitals and nursing note reviewed.  Constitutional:      General: She is not in acute distress.    Appearance: Normal appearance. She is well-developed. She is obese. She is not diaphoretic.  HENT:     Head: Normocephalic and atraumatic.     Nose: Nose normal.     Mouth/Throat:     Pharynx: No oropharyngeal exudate.  Eyes:     Pupils: Pupils are equal, round, and reactive to light.  Neck:     Thyroid: No thyromegaly.     Vascular: No carotid bruit or JVD.     Trachea: No tracheal deviation.  Cardiovascular:     Rate and Rhythm: Normal rate and regular rhythm.     Pulses: Normal pulses.     Heart sounds: Normal heart sounds. No murmur heard.  No friction rub. No gallop.      Comments: Mild, non pitting edema in left ankle.  Pulmonary:     Effort: Pulmonary effort is normal. No respiratory distress.     Breath sounds: Normal breath sounds. No wheezing or rales.  Chest:     Chest wall: No tenderness.  Abdominal:     General: Bowel sounds are normal.     Palpations: Abdomen is soft.     Tenderness: There is no abdominal tenderness.  Musculoskeletal:        General: Normal range of motion.     Cervical back: Normal range of motion and neck supple.     Left lower leg: Edema present.  Lymphadenopathy:     Cervical: No cervical adenopathy.  Skin:    General: Skin is warm and dry.     Capillary Refill: Capillary refill takes 2 to 3 seconds.  Neurological:     General: No focal deficit  present.     Mental Status: She is alert and oriented to person, place, and time.     Cranial Nerves: No cranial nerve deficit.  Psychiatric:        Mood and Affect: Mood normal.        Behavior: Behavior normal.        Thought Content: Thought content normal.        Judgment: Judgment normal.    Depression screen [facility] 2/9 03/02/2020 08/27/2019 05/04/2019 02/26/2019 11/24/2018  Decreased Interest 0 0 0 0 0  Down, Depressed, Hopeless 0 0 0 0 0  PHQ - 2 Score 0 0 0 0 0  Altered sleeping - - - - -  Tired, decreased energy - - - - -  Change  in appetite - - - - -  Feeling bad or failure about yourself  - - - - -  Trouble concentrating - - - - -  Moving slowly or fidgety/restless - - - - -  Suicidal thoughts - - - - -  PHQ-9 Score - - - - -  Difficult doing work/chores - - - - -    Functional Status Survey: Is the patient deaf or have difficulty hearing?: Yes Does the patient have difficulty seeing, even when wearing glasses/contacts?: Yes Does the patient have difficulty concentrating, remembering, or making decisions?: No Does the patient have difficulty walking or climbing stairs?: Yes Does the patient have difficulty dressing or bathing?: No Does the patient have difficulty doing errands alone such as visiting a doctor's office or shopping?: No  MMSE - [place] Exam 03/02/2020 02/20/2018  Orientation to time 5 5  Orientation to Place 5 5  Registration 3 3  Attention/ Calculation 5 5  Recall 3 3  Language- name 2 objects 2 2  Language- repeat 1 1  Language- follow 3 step command 3 3  Language- read & follow direction 1 1  Write a sentence 1 1  Copy design 1 1  Total score 30 30    Fall Risk  03/02/2020 08/27/2019 05/04/2019 02/26/2019 02/26/2019  Falls in the past year? 0 0 '1 1 1  ' Number falls in past yr: - - 0 0 0  Injury with Fall? - - 0 1 1    Assessment/Plan: 1. Encounter for general adult medical examination with abnormal findings Annual health  maintenance exam today. Order slip given to check routine, fasting labs.   2. Hypertension, unspecified type Stable. Continue bp medication as prescribed   3. Mixed hyperlipidemia Check fasting lipid panel. Adjust lovastatin dose as indicated.  - lovastatin (MEVACOR) 40 MG tablet; Take 1 tablet (40 mg total) by mouth at bedtime.  Dispense: 90 tablet; Refill: 3  4. Hypokalemia Continue Klor-con as prescribed. Check labs and adjust dosing as indicated.  - potassium chloride SA (KLOR-CON) 20 MEQ tablet; Take 1 tablet (20 mEq total) by mouth daily.  Dispense: 90 tablet; Refill: 3  5. Atherosclerosis of artery of both lower extremities (HCC) History of mild arterial insufficiency on right and borderline insufficiency on left. Continue lovastatin and baby aspirin daily. Monitor closely.   6. Needs flu shot Flu vaccine administered today.  - Flu Vaccine MDCK QUAD PF  General Counseling: Srah verbalizes understanding of the findings of todays visit and agrees with plan of treatment. I have discussed any further diagnostic evaluation that may be needed or ordered today. We also reviewed her medications today. she has been encouraged to call the office with any questions or concerns that should arise related to todays visit.    Counseling:  This patient was seen by Leretha Pol FNP Collaboration with Dr Lavera Guise as a part of collaborative care agreement  Orders Placed This Encounter  Procedures  . Flu Vaccine MDCK QUAD PF    Meds ordered this encounter  Medications  . lovastatin (MEVACOR) 40 MG tablet    Sig: Take 1 tablet (40 mg total) by mouth at bedtime.    Dispense:  90 tablet    Refill:  3    Order Specific Question:   Supervising Provider    Answer:   Lavera Guise [[id number]]  . potassium chloride SA (KLOR-CON) 20 MEQ tablet    Sig: Take 1 tablet (20 mEq total) by mouth  daily.    Dispense:  90 tablet    Refill:  3    This prescription was filled on 06/20/2017. Any refills  authorized will be placed on file.    Order Specific Question:   Supervising Provider    Answer:   Lavera Guise [[id number]]    Total time spent: 15 Minutes  Time spent includes review of chart, medications, test results, and follow up plan with the patient.     Lavera Guise, MD  Internal Medicine

## 2020-03-23 ENCOUNTER — Other Ambulatory Visit: Payer: Self-pay

## 2020-03-23 DIAGNOSIS — L209 Atopic dermatitis, unspecified: Secondary | ICD-10-CM

## 2020-03-23 MED ORDER — TRIAMCINOLONE ACETONIDE 0.5 % EX CREA: TOPICAL_CREAM | Freq: Two times a day (BID) | CUTANEOUS | 1 refills | Status: DC

## 2020-05-04 ENCOUNTER — Other Ambulatory Visit: Payer: Self-pay | Admitting: Nurse Practitioner

## 2020-05-04 DIAGNOSIS — I10 Essential (primary) hypertension: Secondary | ICD-10-CM | POA: Diagnosis not present

## 2020-05-04 DIAGNOSIS — E559 Vitamin D deficiency, unspecified: Secondary | ICD-10-CM | POA: Diagnosis not present

## 2020-05-04 DIAGNOSIS — Z0001 Encounter for general adult medical examination with abnormal findings: Secondary | ICD-10-CM | POA: Diagnosis not present

## 2020-05-04 DIAGNOSIS — E782 Mixed hyperlipidemia: Secondary | ICD-10-CM | POA: Diagnosis not present

## 2020-05-05 LAB — BASIC METABOLIC PANEL
BUN/Creatinine Ratio: 26 (ref 12–28)
BUN: 22 mg/dL (ref 8–27)
CO2: 15 mmol/L — ABNORMAL LOW (ref 20–29)
Calcium: 8.8 mg/dL (ref 8.7–10.3)
Chloride: 104 mmol/L (ref 96–106)
Creatinine, Ser: 0.86 mg/dL (ref 0.57–1.00)
GFR calc Af Amer: 81 mL/min/{1.73_m2} (ref >59)
GFR calc non Af Amer: 70 mL/min/{1.73_m2} (ref >59)
Glucose: 59 mg/dL — ABNORMAL LOW (ref 65–99)
Potassium: 4.8 mmol/L (ref 3.5–5.2)
Sodium: 143 mmol/L (ref 134–144)

## 2020-05-05 LAB — HCV AB W REFLEX TO QUANT PCR: HCV Ab: <0.1 s/co ratio (ref 0.0–0.9)

## 2020-05-05 LAB — T4, FREE: Free T4: 1.2 ng/dL (ref 0.82–1.77)

## 2020-05-05 LAB — LIPID PANEL WITH LDL/HDL RATIO
Cholesterol, Total: 175 mg/dL (ref 100–199)
HDL: 46 mg/dL (ref >39)
LDL Chol Calc (NIH): 111 mg/dL — ABNORMAL HIGH (ref 0–99)
LDL/HDL Ratio: 2.4 ratio (ref 0.0–3.2)
Triglycerides: 100 mg/dL (ref 0–149)
VLDL Cholesterol Cal: 18 mg/dL (ref 5–40)

## 2020-05-05 LAB — TSH: TSH: 1.84 u[IU]/mL (ref 0.450–4.500)

## 2020-05-05 LAB — HCV INTERPRETATION

## 2020-05-05 LAB — CBC
Hematocrit: 37.9 % (ref 34.0–46.6)
Hemoglobin: 12.2 g/dL (ref 11.1–15.9)
MCH: 28 pg (ref 26.6–33.0)
MCHC: 32.2 g/dL (ref 31.5–35.7)
MCV: 87 fL (ref 79–97)
Platelets: 192 10*3/uL (ref 150–450)
RBC: 4.35 x10E6/uL (ref 3.77–5.28)
RDW: 14.9 % (ref 11.7–15.4)
WBC: 5.9 10*3/uL (ref 3.4–10.8)

## 2020-05-05 LAB — VITAMIN D 25 HYDROXY (VIT D DEFICIENCY, FRACTURES): Vit D, 25-Hydroxy: 17.4 ng/mL — ABNORMAL LOW (ref 30.0–100.0)

## 2020-05-07 NOTE — Progress Notes (Signed)
Vitamin d deficiency. Treat with drisdol weekly.

## 2020-05-12 ENCOUNTER — Other Ambulatory Visit: Payer: Self-pay

## 2020-05-12 DIAGNOSIS — R609 Edema, unspecified: Secondary | ICD-10-CM

## 2020-05-12 DIAGNOSIS — I10 Essential (primary) hypertension: Secondary | ICD-10-CM

## 2020-05-12 MED ORDER — LOSARTAN POTASSIUM-HCTZ 100-25 MG PO TABS: ORAL_TABLET | ORAL | 1 refills | Status: DC

## 2020-05-12 MED ORDER — FUROSEMIDE 20 MG PO TABS: 20.0000 mg | ORAL_TABLET | Freq: Every day | ORAL | 1 refills | Status: DC

## 2020-05-18 ENCOUNTER — Other Ambulatory Visit: Payer: Self-pay

## 2020-05-18 DIAGNOSIS — I10 Essential (primary) hypertension: Secondary | ICD-10-CM

## 2020-05-18 MED ORDER — CARVEDILOL 25 MG PO TABS: 25.0000 mg | ORAL_TABLET | Freq: Every day | ORAL | 1 refills | Status: DC

## 2020-05-24 ENCOUNTER — Telehealth: Payer: Self-pay

## 2020-05-24 NOTE — Telephone Encounter (Signed)
lmom to call us back her occult fecal blood test is negative

## 2020-07-10 ENCOUNTER — Other Ambulatory Visit: Payer: Self-pay | Admitting: Internal Medicine

## 2020-07-10 DIAGNOSIS — R062 Wheezing: Secondary | ICD-10-CM

## 2020-07-26 ENCOUNTER — Telehealth: Payer: Self-pay | Admitting: Internal Medicine

## 2020-07-26 NOTE — Progress Notes (Signed)
Chronic Care Management Pharmacy Note  08/01/2020 Name:  Tiffany Schaefer MRN:  [id number] DOB:  1950-11-08  Subjective: Tiffany Schaefer is an 70 y.o. year old female who is a primary patient of Humphrey Rolls, Timoteo Gaul, MD.  The CCM team was consulted for assistance with disease management and care coordination needs.    Engaged with patient by telephone for initial visit in response to provider referral for pharmacy case management and/or care coordination services.   Consent to Services:  The patient was given the following information about Chronic Care Management services today, agreed to services, and gave verbal consent: 1. CCM service includes personalized support from designated clinical staff supervised by the primary care provider, including individualized plan of care and coordination with other care providers 2. 24/7 contact phone numbers for assistance for urgent and routine care needs. 3. Service will only be billed when office clinical staff spend 20 minutes or more in a month to coordinate care. 4. Only one practitioner may furnish and bill the service in a calendar month. 5.The patient may stop CCM services at any time (effective at the end of the month) by phone call to the office staff. 6. The patient will be responsible for cost sharing (co-pay) of up to 20% of the service fee (after annual deductible is met). Patient agreed to services and consent obtained.  Patient Care Team: Lavera Guise, MD as PCP - General (Internal Medicine) Christene Lye, MD (General Surgery) Edythe Clarity, [facility] as Pharmacist (Pharmacist)  Recent office visits: 03/02/20 Junius Creamer) - regular follow up, labs drawn patient started on weekly dose of Vitamin D.  Recent consult visits: None recent  Hospital visits: None in previous 6 months  Objective:  Lab Results  Component Value Date   CREATININE 0.86 05/04/2020   BUN 22 05/04/2020   GFRNONAA 70 05/04/2020   GFRAA 81 05/04/2020   NA 143  05/04/2020   K 4.8 05/04/2020   CALCIUM 8.8 05/04/2020   CO2 15 (L) 05/04/2020   GLUCOSE 59 (L) 05/04/2020    No results found for: HGBA1C, FRUCTOSAMINE, GFR, MICROALBUR  Last diabetic Eye exam: No results found for: HMDIABEYEEXA  Last diabetic Foot exam: No results found for: HMDIABFOOTEX   Lab Results  Component Value Date   CHOL 175 05/04/2020   HDL 46 05/04/2020   LDLCALC 111 (H) 05/04/2020   TRIG 100 05/04/2020    Hepatic Function Latest Ref Rng & Units 04/26/2019 04/23/2018  Total Protein 6.0 - 8.5 g/dL 7.8 7.5  Albumin 3.8 - 4.8 g/dL 3.5(L) 3.7  AST 0 - 40 IU/L 15 14  ALT 0 - 32 IU/L 11 9  Alk Phosphatase 39 - 117 IU/L 52 43  Total Bilirubin 0.0 - 1.2 mg/dL 0.3 0.3    Lab Results  Component Value Date/Time   TSH 1.840 05/04/2020 09:22 AM   TSH 1.590 04/26/2019 10:03 AM   FREET4 1.20 05/04/2020 09:22 AM   FREET4 1.22 04/26/2019 10:03 AM    CBC Latest Ref Rng & Units 05/04/2020 04/26/2019 04/23/2018  WBC 3.4 - 10.8 x10E3/uL 5.9 6.7 7.9  Hemoglobin 11.1 - 15.9 g/dL 12.2 13.9 12.9  Hematocrit 34.0 - 46.6 % 37.9 41.7 39.6  Platelets 150 - 450 x10E3/uL 192 260 301    Lab Results  Component Value Date/Time   VD25OH 17.4 (L) 05/04/2020 09:22 AM   VD25OH 19.8 (L) 04/26/2019 10:03 AM    Clinical ASCVD: No  The 10-year ASCVD risk score (Goff DC Jr.,  et al., 2013) is: 19%   Values used to calculate the score:     Age: 48 years     Sex: Female     Is Non-Hispanic African American: Yes     Diabetic: No     Tobacco smoker: Yes     Systolic Blood Pressure: 341 mmHg     Is BP treated: Yes     HDL Cholesterol: 46 mg/dL     Total Cholesterol: 175 mg/dL    Depression screen [facility] 2/9 03/02/2020 08/27/2019 05/04/2019  Decreased Interest 0 0 0  Down, Depressed, Hopeless 0 0 0  PHQ - 2 Score 0 0 0  Altered sleeping - - -  Tired, decreased energy - - -  Change in appetite - - -  Feeling bad or failure about yourself  - - -  Trouble concentrating - - -  Moving slowly  or fidgety/restless - - -  Suicidal thoughts - - -  PHQ-9 Score - - -  Difficult doing work/chores - - -     Social History   Tobacco Use  Smoking Status Current Every Day Smoker  . Packs/day: 1.00  . Years: 30.00  . Pack years: 30.00  . Types: Cigarettes  Smokeless Tobacco Never Used   BP Readings from Last 3 Encounters:  03/02/20 133/86  08/27/19 134/75  05/04/19 140/84   Pulse Readings from Last 3 Encounters:  03/02/20 (!) 107  08/27/19 92  05/04/19 92   Wt Readings from Last 3 Encounters:  03/02/20 235 lb (106.6 kg)  08/27/19 241 lb 6.4 oz (109.5 kg)  06/03/19 245 lb (111.1 kg)   BMI Readings from Last 3 Encounters:  03/02/20 41.63 kg/m  08/27/19 42.76 kg/m  06/03/19 43.40 kg/m    Assessment/Interventions: Review of patient past medical history, allergies, medications, health status, including review of consultants reports, laboratory and other test data, was performed as part of comprehensive evaluation and provision of chronic care management services.   SDOH:  (Social Determinants of Health) assessments and interventions performed: Yes   Financial Resource Strain: Low Risk   . Difficulty of Paying Living Expenses: Not hard at all    SDOH Screenings   Alcohol Screen: Not on file  Depression (PHQ2-9): Low Risk   . PHQ-2 Score: 0  Financial Resource Strain: Low Risk   . Difficulty of Paying Living Expenses: Not hard at all  Food Insecurity: Not on file  Housing: Not on file  Physical Activity: Not on file  Social Connections: Not on file  Stress: Not on file  Tobacco Use: High Risk  . Smoking Tobacco Use: Current Every Day Smoker  . Smokeless Tobacco Use: Never Used  Transportation Needs: Not on file    [place]  No Known Allergies  Medications Reviewed Today    Reviewed by Edythe Clarity, [facility] (Pharmacist) on 08/01/20 at 1554  Med List Status: <None>  Medication Order Taking? Sig Documenting Provider Last Dose Status Informant   albuterol (VENTOLIN HFA) 108 (90 Base) MCG/ACT inhaler [id number] Yes INHALE 2 PUFFS BY MOUTH INTO THE LUNGS EVERY 6 HOURS AS NEEDED FOR WHEEZING OR SHORTNESS OF BREATH Lavera Guise, MD Taking Active   aspirin EC 81 MG tablet [id number] Yes Take 81 mg by mouth daily with lunch.  [provider] Taking Active Self  bisacodyl (DULCOLAX) 5 MG EC tablet [id number] Yes Take 5 mg by mouth daily as needed for moderate constipation. [provider] Taking Active   carvedilol (COREG) 25  MG tablet [id number] Yes Take 1 tablet (25 mg total) by mouth daily with lunch. Lavera Guise, MD Taking Active   furosemide (LASIX) 20 MG tablet [id number] Yes Take 1 tablet (20 mg total) by mouth daily. Ronnell Freshwater, NP Taking Active   losartan-hydrochlorothiazide (HYZAAR) 100-25 MG tablet [id number] Yes TAKE ONE TABLET BY MOUTH ONCE DAILY WITH LUNCH Boscia, Heather E, NP Taking Active   lovastatin (MEVACOR) 40 MG tablet [id number] Yes Take 1 tablet (40 mg total) by mouth at bedtime. Ronnell Freshwater, NP Taking Active   potassium chloride SA (KLOR-CON) 20 MEQ tablet [id number] Yes Take 1 tablet (20 mEq total) by mouth daily. Ronnell Freshwater, NP Taking Active   triamcinolone cream (KENALOG) 0.5 % [id number] Yes Apply topically 2 (two) times daily. Ronnell Freshwater, NP Taking Active           Patient Active Problem List   Diagnosis Date Noted  . Atherosclerosis of artery of both lower extremities ([place]) 05/05/2019  . Needs flu shot 05/05/2019  . Encounter for general adult medical examination with abnormal findings 03/14/2019  . Acute right ankle pain 03/14/2019  . Dysuria 03/14/2019  . Wheezing 11/24/2018  . Acute upper respiratory infection 10/21/2018  . Cough 10/21/2018  . Atopic dermatitis 08/21/2018  . Screening for breast cancer 11/09/2017  . Peripheral edema 10/20/2017  . Hypokalemia 10/20/2017  . Leiomyoma of uterus, unspecified 06/09/2017  . Cigarette nicotine dependence without  complication [id number]/9381  . Post-menopausal bleeding 04/21/2017  . Hypertension 04/21/2017  . Mixed hyperlipidemia 04/21/2017  . Cardiac arrhythmia 04/21/2017  . Edema 04/21/2017    Immunization History  Administered Date(s) Administered  . Influenza Inj Mdck Quad Pf 05/04/2019, 03/02/2020  . PFIZER(Purple Top)SARS-COV-2 Vaccination 07/20/2019, 08/10/2019    Conditions to be addressed/monitored:  HTN, HLD, Peripheral Edema, Nicotine Dependence.  Care Plan : General Pharmacy (Adult)  Updates made by Edythe Clarity, RPH since 08/01/2020 12:00 AM    Problem: HTN, HLD, Peripheral edema Resolved 08/01/2020  Priority: High  Onset Date: 08/01/2020    Problem: CHL AMB "PATIENT-SPECIFIC PROBLEM"   Priority: High  Onset Date: 08/01/2020  Note:   Current Barriers:  . No specific barriers identified at this visit.  Pharmacist Clinical Goal(s):  Marland Kitchen Patient will achieve adherence to monitoring guidelines and medication adherence to achieve therapeutic efficacy . maintain control of blood pressure as evidenced by home monitoring  . adhere to prescribed medication regimen as evidenced by fill dates through collaboration with PharmD and provider.   Interventions: . 1:1 collaboration with Lavera Guise, MD regarding development and update of comprehensive plan of care as evidenced by provider attestation and co-signature . Inter-disciplinary care team collaboration (see longitudinal plan of care) . Comprehensive medication review performed; medication list updated in electronic medical record  Hypertension (BP goal <140/90) -Controlled -Current treatment: . Losartan-HCTZ 100/57m daily . Carvedilol 264mdaily -Medications previously tried: none noted -Current home readings: unknown, patient does not have access to a cuff -Current dietary habits: patient eats whatever she has at the time.  Chicken nuggets and rice today with her grandkids -Current exercise habits: minimal -Reports  hypotensive/hypertensive symptoms - slight headache occasionally, mentions short spell of dizziness yesterday -Educated on BP goals and benefits of medications for prevention of heart attack, stroke and kidney damage; Importance of home blood pressure monitoring; Symptoms of hypotension and importance of maintaining adequate hydration; -Counseled to monitor BP at home at least once per week, document, and provide log at future appointments -  Recommended to continue current medication Recommended to go slow when moving from sitting to standing position.  Hyperlipidemia: (LDL goal < 100) -Not ideally controlled -Current treatment: . Lovastatin 75m daily -Medications previously tried: none noted  -Current dietary patterns: see above -Current exercise habits: see above -Educated on Cholesterol goals;  Benefits of statin for ASCVD risk reduction; Importance of limiting foods high in cholesterol; Proper administration times for statin medications.  -She is currently taking mid day.   -Recommended to continue current medication Recommended she try taking her Lovastatin at bedtime.  However, if this causes problems with adherence I encouraged her that I would rather her take it daily in the afternoon than miss doses at night time.  Tobacco use (Goal Minimize smoking/smoking cessation) -Uncontrolled -Previous quit attempts: none -Current treatment  -Patient not interested in smoking cessation at this time. -Encouraged her to reach out if she needs support and feels it is the right time for her to start.    Peripheral Edema (Goal: Reduce swelling/minimize symptoms) -Controlled -Current treatment  . Furosemide 231m-Medications previously tried: none noted -Patient mentions her R ankle will swell sometimes throughout the day -She is adherence with her Lasix and elevated feet at night when she lays down.  This usually resolves all swelling.  -Recommended to continue current  medication    Patient Goals/Self-Care Activities . Patient will:  - take medications as prescribed check blood pressure once weekly, document, and provide at future appointments engage in dietary modifications by limiting fried foods and sugar intake from juices and koolaid  Follow Up Plan: The care management team will reach out to the patient again over the next 6 months days.        Medication Assistance: None required.  Patient affirms current coverage meets needs.  Patient's preferred pharmacy is:  [place]: [phone]: [address]hone: [phone]: [id number] [address] [place]one: [phone]: [phone] pill box? No - has all of her vials organized in a cabinet Pt endorses 100% compliance  We discussed: Benefits of medication synchronization, packaging and delivery as well as enhanced pharmacist oversight with [facility]. Patient decided to: Continue current medication management strategy  Care Plan and Follow Up Patient Decision:  Patient agrees to Care Plan and Follow-up.  Plan: The care management team will reach out to the patient again over the next 180 days.  ChrBeverly MilchharmD Clinical Pharmacist [phone]

## 2020-07-26 NOTE — Patient Instructions (Addendum)
Visit Information  Goals Addressed            This Visit's Progress   . Track and Manage My Blood Pressure-Hypertension       Timeframe:  Long-Range Goal Priority:  High Start Date:  08/01/20                           Expected End Date: 02/01/21                       Follow Up Date 11/02/20    - check blood pressure weekly - choose a place to take my blood pressure (home, clinic or office, retail store) - write blood pressure results in a log or diary    Why is this important?    You won't feel high blood pressure, but it can still hurt your blood vessels.   High blood pressure can cause heart or kidney problems. It can also cause a stroke.   Making lifestyle changes like losing a little weight or eating less salt will help.   Checking your blood pressure at home and at different times of the day can help to control blood pressure.   If the doctor prescribes medicine remember to take it the way the doctor ordered.   Call the office if you cannot afford the medicine or if there are questions about it.     Notes:       Patient Care Plan: General Pharmacy (Adult)    Problem Identified: HTN, HLD, Peripheral edema Resolved 08/01/2020  Priority: High  Onset Date: 08/01/2020    Problem Identified: CHL AMB "PATIENT-SPECIFIC PROBLEM"   Priority: High  Onset Date: 08/01/2020  Note:   Current Barriers:  . No specific barriers identified at this visit.  Pharmacist Clinical Goal(s):  Marland Kitchen Patient will achieve adherence to monitoring guidelines and medication adherence to achieve therapeutic efficacy . maintain control of blood pressure as evidenced by home monitoring  . adhere to prescribed medication regimen as evidenced by fill dates through collaboration with PharmD and provider.   Interventions: . 1:1 collaboration with Lavera Guise, MD regarding development and update of comprehensive plan of care as evidenced by provider attestation and co-signature . Inter-disciplinary  care team collaboration (see longitudinal plan of care) . Comprehensive medication review performed; medication list updated in electronic medical record  Hypertension (BP goal <140/90) -Controlled -Current treatment: . Losartan-HCTZ 100/25mg  daily . Carvedilol 25mg  daily -Medications previously tried: none noted -Current home readings: unknown, patient does not have access to a cuff -Current dietary habits: patient eats whatever she has at the time.  Chicken nuggets and rice today with her grandkids -Current exercise habits: minimal -Reports hypotensive/hypertensive symptoms - slight headache occasionally, mentions short spell of dizziness yesterday -Educated on BP goals and benefits of medications for prevention of heart attack, stroke and kidney damage; Importance of home blood pressure monitoring; Symptoms of hypotension and importance of maintaining adequate hydration; -Counseled to monitor BP at home at least once per week, document, and provide log at future appointments -Recommended to continue current medication Recommended to go slow when moving from sitting to standing position.  Hyperlipidemia: (LDL goal < 100) -Not ideally controlled -Current treatment: . Lovastatin 40mg  daily -Medications previously tried: none noted  -Current dietary patterns: see above -Current exercise habits: see above -Educated on Cholesterol goals;  Benefits of statin for ASCVD risk reduction; Importance of limiting foods high in cholesterol; Proper administration  times for statin medications.  -She is currently taking mid day.   -Recommended to continue current medication Recommended she try taking her Lovastatin at bedtime.  However, if this causes problems with adherence I encouraged her that I would rather her take it daily in the afternoon than miss doses at night time.  Tobacco use (Goal Minimize smoking/smoking cessation) -Uncontrolled -Previous quit attempts: none -Current treatment   -Patient not interested in smoking cessation at this time. -Encouraged her to reach out if she needs support and feels it is the right time for her to start.    Peripheral Edema (Goal: Reduce swelling/minimize symptoms) -Controlled -Current treatment  . Furosemide 20mg  -Medications previously tried: none noted -Patient mentions her R ankle will swell sometimes throughout the day -She is adherence with her Lasix and elevated feet at night when she lays down.  This usually resolves all swelling.  -Recommended to continue current medication    Patient Goals/Self-Care Activities . Patient will:  - take medications as prescribed check blood pressure once weekly, document, and provide at future appointments engage in dietary modifications by limiting fried foods and sugar intake from juices and koolaid  Follow Up Plan: The care management team will reach out to the patient again over the next 6 months days.       Ms. Tartt was given information about Chronic Care Management services today including:  1. CCM service includes personalized support from designated clinical staff supervised by her physician, including individualized plan of care and coordination with other care providers 2. 24/7 contact phone numbers for assistance for urgent and routine care needs. 3. Standard insurance, coinsurance, copays and deductibles apply for chronic care management only during months in which we provide at least 20 minutes of these services. Most insurances cover these services at 100%, however patients may be responsible for any copay, coinsurance and/or deductible if applicable. This service may help you avoid the need for more expensive face-to-face services. 4. Only one practitioner may furnish and bill the service in a calendar month. 5. The patient may stop CCM services at any time (effective at the end of the month) by phone call to the office staff.  Patient agreed to services and verbal  consent obtained.   The patient verbalized understanding of instructions, educational materials, and care plan provided today and agreed to receive a mailed copy of patient instructions, educational materials, and care plan.  Telephone follow up appointment with pharmacy team member scheduled for: 6 months  Edythe Clarity, [facility]  Blood Pressure Record Sheet To take your blood pressure, you will need a blood pressure machine. You can buy a blood pressure machine (blood pressure monitor) at your clinic, drug store, or online. When choosing one, consider:  An automatic monitor that has an arm cuff.  A cuff that wraps snugly around your upper arm. You should be able to fit only one finger between your arm and the cuff.  A device that stores blood pressure reading results.  Do not choose a monitor that measures your blood pressure from your wrist or finger. Follow your health care provider's instructions for how to take your blood pressure. To use this form:  Get one reading in the morning (a.m.) before you take any medicines.  Get one reading in the evening (p.m.) before supper.  Take at least 2 readings with each blood pressure check. This makes sure the results are correct. Wait 1-2 minutes between measurements.  Write down the results in the spaces  on this form.  Repeat this once a week, or as told by your health care provider.  Make a follow-up appointment with your health care provider to discuss the results. Blood pressure log Date: _______________________  a.m. _____________________(1st reading) _____________________(2nd reading)  p.m. _____________________(1st reading) _____________________(2nd reading) Date: _______________________  a.m. _____________________(1st reading) _____________________(2nd reading)  p.m. _____________________(1st reading) _____________________(2nd reading) Date: _______________________  a.m. _____________________(1st reading)  _____________________(2nd reading)  p.m. _____________________(1st reading) _____________________(2nd reading) Date: _______________________  a.m. _____________________(1st reading) _____________________(2nd reading)  p.m. _____________________(1st reading) _____________________(2nd reading) Date: _______________________  a.m. _____________________(1st reading) _____________________(2nd reading)  p.m. _____________________(1st reading) _____________________(2nd reading) This information is not intended to replace advice given to you by your health care provider. Make sure you discuss any questions you have with your health care provider. Document Revised: 08/11/2019 Document Reviewed: 08/11/2019 Elsevier Patient Education  2021 [employer].

## 2020-07-26 NOTE — Progress Notes (Signed)
  Chronic Care Management   Note  07/26/2020 Name: Tiffany Schaefer MRN: [id number] DOB: 30-Apr-1951  Tiffany Schaefer is a 70 y.o. year old female who is a primary care patient of Lavera Guise, MD. I reached out to Arnetha Gula by phone today in response to a referral sent by Ms. Karma Greaser Perlmutter's PCP, Lavera Guise, MD.   Tiffany Schaefer was given information about Chronic Care Management services today including:  1. CCM service includes personalized support from designated clinical staff supervised by her physician, including individualized plan of care and coordination with other care providers 2. 24/7 contact phone numbers for assistance for urgent and routine care needs. 3. Service will only be billed when office clinical staff spend 20 minutes or more in a month to coordinate care. 4. Only one practitioner may furnish and bill the service in a calendar month. 5. The patient may stop CCM services at any time (effective at the end of the month) by phone call to the office staff.   Patient agreed to services and verbal consent obtained.   Follow up plan:   Carley Perdue [employer] Scheduler

## 2020-07-27 ENCOUNTER — Telehealth: Payer: Self-pay | Admitting: Pharmacist

## 2020-07-27 NOTE — Progress Notes (Addendum)
° ° °  Chronic Care Management Pharmacy Assistant   Name: Tiffany Schaefer  MRN: [id number] DOB: 1950/12/11  Reason for Encounter: Initial Questions   Medications: Outpatient Encounter Medications as of 07/27/2020  Medication Sig   albuterol (VENTOLIN HFA) 108 (90 Base) MCG/ACT inhaler INHALE 2 PUFFS BY MOUTH INTO THE LUNGS EVERY 6 HOURS AS NEEDED FOR WHEEZING OR SHORTNESS OF BREATH   aspirin EC 81 MG tablet Take 81 mg by mouth daily with lunch.    bisacodyl (DULCOLAX) 5 MG EC tablet Take 5 mg by mouth daily as needed for moderate constipation.   carvedilol (COREG) 25 MG tablet Take 1 tablet (25 mg total) by mouth daily with lunch.   furosemide (LASIX) 20 MG tablet Take 1 tablet (20 mg total) by mouth daily.   losartan-hydrochlorothiazide (HYZAAR) 100-25 MG tablet TAKE ONE TABLET BY MOUTH ONCE DAILY WITH LUNCH   lovastatin (MEVACOR) 40 MG tablet Take 1 tablet (40 mg total) by mouth at bedtime.   potassium chloride SA (KLOR-CON) 20 MEQ tablet Take 1 tablet (20 mEq total) by mouth daily.   triamcinolone cream (KENALOG) 0.5 % Apply topically 2 (two) times daily.   No facility-administered encounter medications on file as of 07/27/2020.    Have you seen any other providers since your last visit? Patients daughter stated no.  Any changes in your medications or health? Patients daughter stated no.  Any side effects from any medications? Patients daughter stated no.  Do you have an symptoms or problems not managed by your medications? Patients daughter stated no.  Any concerns about your health right now? Patient daughters stated no.  Has your provider asked that you check blood pressure, blood sugar, or follow special diet at home? Patient daughters stated no.   Do you get any type of exercise on a regular basis? Patient daughters stated no.   Can you think of a goal you would like to reach for your health? Patients daughter stated eating better.  Do you have any problems getting your  medications? Patients daughter stated no  Is there anything that you would like to discuss during the appointment? Patients daughter stated no.  Please bring medications and supplements to other the phone appointment, patient reminded.   Follow-Up: Pharmacist Review  Charlann Lange, [place] Clinical Pharmacist Assistant [phone]  Reviewed by: Beverly Milch, PharmD Clinical Pharmacist [place] [phone]

## 2020-08-01 ENCOUNTER — Ambulatory Visit: Payer: Medicare Other | Admitting: Pharmacist

## 2020-08-01 DIAGNOSIS — R609 Edema, unspecified: Secondary | ICD-10-CM

## 2020-08-01 DIAGNOSIS — I10 Essential (primary) hypertension: Secondary | ICD-10-CM

## 2020-08-01 DIAGNOSIS — E782 Mixed hyperlipidemia: Secondary | ICD-10-CM

## 2020-08-02 ENCOUNTER — Telehealth: Payer: Self-pay | Admitting: Pharmacist

## 2020-08-02 NOTE — Progress Notes (Addendum)
    Chronic Care Management Pharmacy Assistant   Name: TEEA DUCEY  MRN: [id number] DOB: December 09, 1950  Reason for Encounter: Adherence Review  I reviewed the patients chart for any medical/heath changes and/or medication changes, there were not any changes at this time.  Follow-Up:Pharmacist Review  Charlann Lange, [place] Pharmacist Assistant [phone]

## 2020-08-03 DIAGNOSIS — E782 Mixed hyperlipidemia: Secondary | ICD-10-CM | POA: Diagnosis not present

## 2020-08-03 DIAGNOSIS — I10 Essential (primary) hypertension: Secondary | ICD-10-CM | POA: Diagnosis not present

## 2020-08-30 ENCOUNTER — Other Ambulatory Visit: Payer: Self-pay

## 2020-08-30 DIAGNOSIS — I10 Essential (primary) hypertension: Secondary | ICD-10-CM

## 2020-08-30 MED ORDER — CARVEDILOL 25 MG PO TABS: 25.0000 mg | ORAL_TABLET | Freq: Every day | ORAL | 1 refills | Status: DC

## 2020-08-31 ENCOUNTER — Other Ambulatory Visit: Payer: Self-pay

## 2020-08-31 ENCOUNTER — Encounter: Payer: Self-pay | Admitting: Hospice and Palliative Medicine

## 2020-08-31 ENCOUNTER — Ambulatory Visit (INDEPENDENT_AMBULATORY_CARE_PROVIDER_SITE_OTHER): Payer: Medicare Other | Admitting: Hospice and Palliative Medicine

## 2020-08-31 VITALS — BP 117/62 | HR 85 | Temp 97.6°F | Resp 16 | Ht 63.0 in | Wt 227.8 lb

## 2020-08-31 DIAGNOSIS — Z1231 Encounter for screening mammogram for malignant neoplasm of breast: Secondary | ICD-10-CM | POA: Diagnosis not present

## 2020-08-31 DIAGNOSIS — I10 Essential (primary) hypertension: Secondary | ICD-10-CM

## 2020-08-31 DIAGNOSIS — E782 Mixed hyperlipidemia: Secondary | ICD-10-CM | POA: Diagnosis not present

## 2020-08-31 DIAGNOSIS — J0140 Acute pansinusitis, unspecified: Secondary | ICD-10-CM | POA: Diagnosis not present

## 2020-08-31 MED ORDER — BENZONATATE 100 MG PO CAPS: 100.0000 mg | ORAL_CAPSULE | Freq: Two times a day (BID) | ORAL | 0 refills | Status: DC | PRN

## 2020-08-31 MED ORDER — AZITHROMYCIN 250 MG PO TABS
ORAL_TABLET | ORAL | 0 refills | Status: AC
Start: 2020-08-31 — End: 2020-09-05

## 2020-08-31 NOTE — Progress Notes (Signed)
[facility] [place], [place] [address]  Internal MEDICINE  Office Visit Note  Patient Name: Tiffany Schaefer  [id number]  [id number]  Date of Service: 09/06/2020  Chief Complaint  Patient presents with  . Follow-up    Couching  . Hyperlipidemia  . Hypertension  . Sleep Apnea  . Arthritis    HPI Patient is here for routine follow-up Has been coughing and having rhinorrhea and tickling in the back of her throat for about a month Has tried taking multiple otc medications without relief of her symptoms  BP remains well controlled Due for screening mammogram  Current Medication: Outpatient Encounter Medications as of 08/31/2020  Medication Sig  . albuterol (VENTOLIN HFA) 108 (90 Base) MCG/ACT inhaler INHALE 2 PUFFS BY MOUTH INTO THE LUNGS EVERY 6 HOURS AS NEEDED FOR WHEEZING OR SHORTNESS OF BREATH  . aspirin EC 81 MG tablet Take 81 mg by mouth daily with lunch.   . [EXPIRED] azithromycin (ZITHROMAX) 250 MG tablet Take 2 tablets on day 1, then 1 tablet daily on days 2 through 5  . benzonatate (TESSALON) 100 MG capsule Take 1 capsule (100 mg total) by mouth 2 (two) times daily as needed for cough.  . bisacodyl (DULCOLAX) 5 MG EC tablet Take 5 mg by mouth daily as needed for moderate constipation.  . carvedilol (COREG) 25 MG tablet Take 1 tablet (25 mg total) by mouth daily with lunch.  . furosemide (LASIX) 20 MG tablet Take 1 tablet (20 mg total) by mouth daily.  Marland Kitchen losartan-hydrochlorothiazide (HYZAAR) 100-25 MG tablet TAKE ONE TABLET BY MOUTH ONCE DAILY WITH LUNCH  . lovastatin (MEVACOR) 40 MG tablet Take 1 tablet (40 mg total) by mouth at bedtime.  . potassium chloride SA (KLOR-CON) 20 MEQ tablet Take 1 tablet (20 mEq total) by mouth daily.  Marland Kitchen triamcinolone cream (KENALOG) 0.5 % Apply topically 2 (two) times daily.   No facility-administered encounter medications on file as of 08/31/2020.    Surgical History: Past Surgical History:  Procedure Laterality  Date  . CESAREAN SECTION     denies 09/03/17  . COLONOSCOPY    . HEMORRHOID SURGERY    . HYSTEROSCOPY WITH D & C N/A 09/11/2017   Procedure: DILATATION AND CURETTAGE /HYSTEROSCOPY;  Surgeon: Malachy Mood, MD;  Location: [facility] ORS;  Service: Gynecology;  Laterality: N/A;    Medical History: Past Medical History:  Diagnosis Date  . Arthritis   . Hyperlipidemia   . Hypertension   . Sinus infection   . Sleep apnea     Family History: Family History  Problem Relation Age of Onset  . Stroke Mother   . Diabetes Brother   . Rheumatic fever Brother   . Arthritis/Rheumatoid Brother     Social History   Socioeconomic History  . Marital status: Married    Spouse name: Not on file  . Number of children: Not on file  . Years of education: Not on file  . Highest education level: Not on file  Occupational History  . Not on file  Tobacco Use  . Smoking status: Current Every Day Smoker    Packs/day: 1.00    Years: 30.00    Pack years: 30.00    Types: Cigarettes  . Smokeless tobacco: Never Used  Vaping Use  . Vaping Use: Never used  Substance and Sexual Activity  . Alcohol use: Yes    Alcohol/week: 0.0 standard drinks    Comment: occasionally  . Drug use: No  .  Sexual activity: Not Currently    Birth control/protection: None  Other Topics Concern  . Not on file  Social History Narrative  . Not on file   Social Determinants of Health   Financial Resource Strain: Low Risk   . Difficulty of Paying Living Expenses: Not hard at all  Food Insecurity: Not on file  Transportation Needs: Not on file  Physical Activity: Not on file  Stress: Not on file  Social Connections: Not on file  Intimate Partner Violence: Not on file      Review of Systems  Constitutional: Negative for chills, diaphoresis and fatigue.  HENT: Positive for rhinorrhea. Negative for ear pain, postnasal drip and sinus pressure.   Eyes: Negative for photophobia, discharge, redness, itching and visual  disturbance.  Respiratory: Positive for cough. Negative for shortness of breath and wheezing.   Cardiovascular: Negative for chest pain, palpitations and leg swelling.  Gastrointestinal: Negative for abdominal pain, constipation, diarrhea, nausea and vomiting.  Genitourinary: Negative for dysuria and flank pain.  Musculoskeletal: Negative for arthralgias, back pain, gait problem and neck pain.  Skin: Negative for color change.  Allergic/Immunologic: Negative for environmental allergies and food allergies.  Neurological: Negative for dizziness and headaches.  Hematological: Does not bruise/bleed easily.  Psychiatric/Behavioral: Negative for agitation, behavioral problems (depression) and hallucinations.    Vital Signs: BP 117/62   Pulse 85   Temp 97.6 F (36.4 C)   Resp 16   Ht 5\' 3"  (1.6 m)   Wt 227 lb 12.8 oz (103.3 kg)   SpO2 94%   BMI 40.35 kg/m    Physical Exam Vitals reviewed.  Constitutional:      Appearance: Normal appearance. She is obese.  HENT:     Nose: Rhinorrhea present.  Cardiovascular:     Rate and Rhythm: Normal rate and regular rhythm.     Pulses: Normal pulses.     Heart sounds: Normal heart sounds.  Pulmonary:     Effort: Pulmonary effort is normal.     Breath sounds: Normal breath sounds.  Abdominal:     General: Abdomen is flat.     Palpations: Abdomen is soft.  Musculoskeletal:        General: Normal range of motion.     Cervical back: Normal range of motion.  Skin:    General: Skin is warm.  Neurological:     General: No focal deficit present.     Mental Status: She is alert and oriented to person, place, and time. Mental status is at baseline.  Psychiatric:        Mood and Affect: Mood normal.        Behavior: Behavior normal.        Thought Content: Thought content normal.        Judgment: Judgment normal.     Assessment/Plan: 1. Acute non-recurrent pansinusitis Treat with ZPAK as well as Tessalon for cough Advised to contact  office if symptoms worsen or have not improved within 10 days - azithromycin (ZITHROMAX) 250 MG tablet; Take 2 tablets on day 1, then 1 tablet daily on days 2 through 5  Dispense: 6 tablet; Refill: 0 - benzonatate (TESSALON) 100 MG capsule; Take 1 capsule (100 mg total) by mouth 2 (two) times daily as needed for cough.  Dispense: 20 capsule; Refill: 0  2. Encounter for screening mammogram for malignant neoplasm of breast - MM 3D SCREEN BREAST BILATERAL; Future  3. Hypertension, unspecified type BP and HR remain well controlled, continue to monitor  4. Mixed hyperlipidemia Continue with statin and ASA therapy, tolerating well  General Counseling: Jourdyn verbalizes understanding of the findings of todays visit and agrees with plan of treatment. I have discussed any further diagnostic evaluation that may be needed or ordered today. We also reviewed her medications today. she has been encouraged to call the office with any questions or concerns that should arise related to todays visit.    Orders Placed This Encounter  Procedures  . MM 3D SCREEN BREAST BILATERAL    Meds ordered this encounter  Medications  . azithromycin (ZITHROMAX) 250 MG tablet    Sig: Take 2 tablets on day 1, then 1 tablet daily on days 2 through 5    Dispense:  6 tablet    Refill:  0  . benzonatate (TESSALON) 100 MG capsule    Sig: Take 1 capsule (100 mg total) by mouth 2 (two) times daily as needed for cough.    Dispense:  20 capsule    Refill:  0    Time spent: 30 Minutes Time spent includes review of chart, medications, test results and follow-up plan with the patient.  This patient was seen by Theodoro Grist AGNP-C in Collaboration with Dr Lavera Guise as a part of collaborative care agreement     Tanna Furry. Ivis Nicolson AGNP-C Internal medicine

## 2020-09-06 ENCOUNTER — Encounter: Payer: Self-pay | Admitting: Hospice and Palliative Medicine

## 2020-10-07 ENCOUNTER — Other Ambulatory Visit: Payer: Self-pay | Admitting: Nurse Practitioner

## 2020-10-07 DIAGNOSIS — E876 Hypokalemia: Secondary | ICD-10-CM

## 2020-10-18 ENCOUNTER — Telehealth: Payer: Self-pay | Admitting: Pharmacist

## 2020-10-18 NOTE — Progress Notes (Addendum)
    Chronic Care Management Pharmacy Assistant   Name: WILDA WETHERELL  MRN: [id number] DOB: 12-11-1950  Reason for Encounter:General Disease State Call   Conditions to be addressed/monitored: HTN, HLD, Peripheral Edema, Nicotine Dependence.  Recent office visits:  08/31/20 Luiz Ochoa, NP. For non-recurrent pansinusitis. STARTED Azithromycin 250 mg Take 2 tablets on day 1, then 1 tablet daily on days 2 through 5 and Benzonatate 100 mg 2 times daily PRN.   Recent consult visits:  None since 08/02/20  Hospital visits:  None since 08/02/20  Medications: Outpatient Encounter Medications as of 10/18/2020  Medication Sig   albuterol (VENTOLIN HFA) 108 (90 Base) MCG/ACT inhaler INHALE 2 PUFFS BY MOUTH INTO THE LUNGS EVERY 6 HOURS AS NEEDED FOR WHEEZING OR SHORTNESS OF BREATH   aspirin EC 81 MG tablet Take 81 mg by mouth daily with lunch.    benzonatate (TESSALON) 100 MG capsule Take 1 capsule (100 mg total) by mouth 2 (two) times daily as needed for cough.   bisacodyl (DULCOLAX) 5 MG EC tablet Take 5 mg by mouth daily as needed for moderate constipation.   carvedilol (COREG) 25 MG tablet Take 1 tablet (25 mg total) by mouth daily with lunch.   furosemide (LASIX) 20 MG tablet Take 1 tablet (20 mg total) by mouth daily.   losartan-hydrochlorothiazide (HYZAAR) 100-25 MG tablet TAKE ONE TABLET BY MOUTH ONCE DAILY WITH LUNCH   lovastatin (MEVACOR) 40 MG tablet Take 1 tablet (40 mg total) by mouth at bedtime.   potassium chloride SA (KLOR-CON) 20 MEQ tablet Take 1 tablet (20 mEq total) by mouth daily.   triamcinolone cream (KENALOG) 0.5 % Apply topically 2 (two) times daily.   No facility-administered encounter medications on file as of 10/18/2020.   GEN CALL:  Patient stated she's been doing good. She stated sometimes she forgets to take her statin medication sometimes because its a bedtime medication. She stated other then that she's feeling fine and is not having any side effects from  starting the medication. She stated she is not doing any type of exercising but is active around the house.  Star Rating Drugs: Losartan-Hydrochlorothiazide 100-25 mg 90 DS 08/09/20 , Lovastatin 40 mg 90 DS 08/29/20  Follow-Up:Pharmacist Review   Charlann Lange, RMA Clinical Pharmacist Assistant [phone]  10 minutes spent in review, coordination, and documentation.  Reviewed by: Beverly Milch, PharmD Clinical Pharmacist [facility] [phone]

## 2020-11-02 DIAGNOSIS — E782 Mixed hyperlipidemia: Secondary | ICD-10-CM | POA: Diagnosis not present

## 2020-11-02 DIAGNOSIS — I10 Essential (primary) hypertension: Secondary | ICD-10-CM | POA: Diagnosis not present

## 2020-11-27 ENCOUNTER — Other Ambulatory Visit: Payer: Self-pay | Admitting: Nurse Practitioner

## 2020-11-27 ENCOUNTER — Other Ambulatory Visit: Payer: Self-pay

## 2020-11-27 DIAGNOSIS — I10 Essential (primary) hypertension: Secondary | ICD-10-CM

## 2020-11-27 DIAGNOSIS — R609 Edema, unspecified: Secondary | ICD-10-CM

## 2020-11-27 MED ORDER — CARVEDILOL 25 MG PO TABS
25.0000 mg | ORAL_TABLET | Freq: Every day | ORAL | 1 refills | Status: DC
Start: 2020-11-27 — End: 2021-03-05

## 2020-12-25 ENCOUNTER — Telehealth: Payer: Self-pay | Admitting: Pharmacist

## 2020-12-25 NOTE — Progress Notes (Addendum)
Chronic Care Management Pharmacy Assistant   Name: Tiffany Schaefer  MRN: [id number] DOB: 07/04/1950   Reason for Encounter: Disease State For HTN   Conditions to be addressed/monitored: HTN, HLD, Peripheral Edema, Nicotine Dependence.  Recent office visits:  None since 10/18/20  Recent consult visits:  None since 10/18/20  Hospital visits:  None since 10/18/20  Medications: Outpatient Encounter Medications as of 12/25/2020  Medication Sig   albuterol (VENTOLIN HFA) 108 (90 Base) MCG/ACT inhaler INHALE 2 PUFFS BY MOUTH INTO THE LUNGS EVERY 6 HOURS AS NEEDED FOR WHEEZING OR SHORTNESS OF BREATH   aspirin EC 81 MG tablet Take 81 mg by mouth daily with lunch.    benzonatate (TESSALON) 100 MG capsule Take 1 capsule (100 mg total) by mouth 2 (two) times daily as needed for cough.   bisacodyl (DULCOLAX) 5 MG EC tablet Take 5 mg by mouth daily as needed for moderate constipation.   carvedilol (COREG) 25 MG tablet Take 1 tablet (25 mg total) by mouth daily with lunch.   furosemide (LASIX) 20 MG tablet Take 1 tablet (20 mg total) by mouth daily.   losartan-hydrochlorothiazide (HYZAAR) 100-25 MG tablet TAKE ONE TABLET BY MOUTH ONCE DAILY WITH LUNCH   lovastatin (MEVACOR) 40 MG tablet Take 1 tablet (40 mg total) by mouth at bedtime.   potassium chloride SA (KLOR-CON) 20 MEQ tablet Take 1 tablet (20 mEq total) by mouth daily.   triamcinolone cream (KENALOG) 0.5 % Apply topically 2 (two) times daily.   No facility-administered encounter medications on file as of 12/25/2020.   Reviewed chart prior to disease state call. Spoke with patient regarding BP  Recent Office Vitals: BP Readings from Last 3 Encounters:  08/31/20 117/62  03/02/20 133/86  08/27/19 134/75   Pulse Readings from Last 3 Encounters:  08/31/20 85  03/02/20 (!) 107  08/27/19 92    Wt Readings from Last 3 Encounters:  08/31/20 227 lb 12.8 oz (103.3 kg)  03/02/20 235 lb (106.6 kg)  08/27/19 241 lb 6.4 oz (109.5 kg)      Kidney Function Lab Results  Component Value Date/Time   CREATININE 0.86 05/04/2020 09:22 AM   CREATININE 0.82 04/26/2019 10:03 AM   GFRNONAA 70 05/04/2020 09:22 AM   GFRAA 81 05/04/2020 09:22 AM    BMP Latest Ref Rng & Units 05/04/2020 04/26/2019 04/23/2018  Glucose 65 - 99 mg/dL 59(L) 95 94  BUN 8 - 27 mg/dL '22 17 15  '$ Creatinine 0.57 - 1.00 mg/dL 0.86 0.82 0.92  BUN/Creat Ratio 12 - '28 26 21 16  '$ Sodium 134 - 144 mmol/L 143 142 140  Potassium 3.5 - 5.2 mmol/L 4.8 3.4(L) 3.4(L)  Chloride 96 - 106 mmol/L 104 102 97  CO2 20 - 29 mmol/L 15(L) 25 26  Calcium 8.7 - 10.3 mg/dL 8.8 9.2 9.1    Current antihypertensive regimen:  Losartan-HCTZ 100/'25mg'$  daily Carvedilol '25mg'$  daily  How often are you checking your Blood Pressure? Patient stated weekly.  Current home BP readings: Patient stated her most recent blood pressure was 120/78.  What recent interventions/DTPs have been made by any provider to improve Blood Pressure control since last CPP Visit: None.   Any recent hospitalizations or ED visits since last visit with CPP? Patient stated no.   What diet changes have been made to improve Blood Pressure Control?  Patient stated she is choosing healthy choices.  What exercise is being done to improve your Blood Pressure Control?  Patient stated she does daily things  around the house.  Adherence Review: Is the patient currently on ACE/ARB medication? Losartan-Hydrochlorothiazide 100-25 mg 90 DS 11/28/20  Does the patient have >5 day gap between last estimated fill dates? Per misc rpts, no.   Star Rating Drugs:  Losartan-Hydrochlorothiazide 100-25 mg 90 DS 11/28/20 , Lovastatin 40 mg 90 DS 11/27/20  Patient stated she does not have any questions or concerns about her medications at this time.   Follow-Up:Pharmacist Review  Charlann Lange, RMA Clinical Pharmacist Assistant [phone]  10 minutes spent in review, coordination, and documentation.  Reviewed by: Beverly Milch, PharmD Clinical Pharmacist [phone]

## 2021-01-01 DIAGNOSIS — Z1231 Encounter for screening mammogram for malignant neoplasm of breast: Secondary | ICD-10-CM | POA: Diagnosis not present

## 2021-01-03 DIAGNOSIS — I10 Essential (primary) hypertension: Secondary | ICD-10-CM | POA: Diagnosis not present

## 2021-01-03 DIAGNOSIS — E782 Mixed hyperlipidemia: Secondary | ICD-10-CM | POA: Diagnosis not present

## 2021-01-10 ENCOUNTER — Other Ambulatory Visit: Payer: Self-pay

## 2021-01-10 DIAGNOSIS — E876 Hypokalemia: Secondary | ICD-10-CM

## 2021-01-10 MED ORDER — POTASSIUM CHLORIDE CRYS ER 20 MEQ PO TBCR: 20.0000 meq | EXTENDED_RELEASE_TABLET | Freq: Every day | ORAL | 0 refills | Status: DC

## 2021-01-17 ENCOUNTER — Other Ambulatory Visit: Payer: Self-pay | Admitting: Nurse Practitioner

## 2021-01-17 DIAGNOSIS — I10 Essential (primary) hypertension: Secondary | ICD-10-CM

## 2021-01-25 ENCOUNTER — Other Ambulatory Visit: Payer: Self-pay | Admitting: Nurse Practitioner

## 2021-01-25 DIAGNOSIS — I10 Essential (primary) hypertension: Secondary | ICD-10-CM

## 2021-02-06 ENCOUNTER — Telehealth: Payer: Self-pay

## 2021-02-07 ENCOUNTER — Ambulatory Visit: Payer: Self-pay | Admitting: Pharmacist

## 2021-02-07 DIAGNOSIS — R609 Edema, unspecified: Secondary | ICD-10-CM

## 2021-02-07 DIAGNOSIS — I10 Essential (primary) hypertension: Secondary | ICD-10-CM

## 2021-02-07 NOTE — Patient Instructions (Addendum)
Visit Information   Goals Addressed   None    Patient Care Plan: General Pharmacy (Adult)     Problem Identified: HTN, HLD, Peripheral edema Resolved 08/01/2020  Priority: High  Onset Date: 08/01/2020     Problem Identified: Edema, HTn, HLD   Priority: High  Onset Date: 08/01/2020     Long-Range Goal: Patient-Specific Goal   Start Date: 02/07/2021  Expected End Date: 08/08/2021  This Visit's Progress: On track  Priority: High  Note:   Current Barriers:  No specific barriers identified at this visit.  Pharmacist Clinical Goal(s):  Patient will achieve adherence to monitoring guidelines and medication adherence to achieve therapeutic efficacy maintain control of blood pressure as evidenced by home monitoring  adhere to prescribed medication regimen as evidenced by fill dates through collaboration with PharmD and provider.   Interventions: 1:1 collaboration with Lavera Guise, MD regarding development and update of comprehensive plan of care as evidenced by provider attestation and co-signature Inter-disciplinary care team collaboration (see longitudinal plan of care) Comprehensive medication review performed; medication list updated in electronic medical record  Hypertension (BP goal <140/90) -Controlled -Current treatment: Losartan-HCTZ 100/25mg  daily Carvedilol 25mg  daily -Medications previously tried: none noted -Current home readings: unknown, patient does not have access to a cuff -Current dietary habits: patient eats whatever she has at the time.  Chicken nuggets and rice today with her grandkids -Current exercise habits: minimal -Reports hypotensive/hypertensive symptoms - slight headache occasionally, mentions short spell of dizziness yesterday -Educated on BP goals and benefits of medications for prevention of heart attack, stroke and kidney damage; Importance of home blood pressure monitoring; Symptoms of hypotension and importance of maintaining adequate  hydration; -Counseled to monitor BP at home at least once per week, document, and provide log at future appointments -Recommended to continue current medication Recommended to go slow when moving from sitting to standing position.  Update 02/07/21  133/80 was her BP this morning.  She reports it is normally around this at home.  She continues same medications.  Has upcoming appointment end of this month for annual wellness - she states she will be out of her losartan/HCTZ by this time and asks if we can send a refill in for her. Will consult with nurses to have new Rx sent in so she does not go without meds.  Encouraged her to keep monitoring BP - bring logs in to office visit end of month.  Hyperlipidemia: (LDL goal < 100) -Not ideally controlled -Current treatment: Lovastatin 40mg  daily -Medications previously tried: none noted  -Current dietary patterns: see above -Current exercise habits: see above -Educated on Cholesterol goals;  Benefits of statin for ASCVD risk reduction; Importance of limiting foods high in cholesterol; Proper administration times for statin medications.  -She is currently taking mid day.   -Recommended to continue current medication Recommended she try taking her Lovastatin at bedtime.  However, if this causes problems with adherence I encouraged her that I would rather her take it daily in the afternoon than miss doses at night time.  Tobacco use (Goal Minimize smoking/smoking cessation) -Uncontrolled -Previous quit attempts: none -Current treatment  -Patient not interested in smoking cessation at this time. -Encouraged her to reach out if she needs support and feels it is the right time for her to start.    Peripheral Edema (Goal: Reduce swelling/minimize symptoms) -Controlled -Current treatment  Furosemide 20mg  -Medications previously tried: none noted -Patient mentions her R ankle will swell sometimes throughout the day -She is adherence with  her  Lasix and elevated feet at night when she lays down.  This usually resolves all swelling.  -Recommended to continue current medication  Update 02/07/21 Still having some edema.  It is not red, painful or warm to the touch. Resolves with Lasix.  Encouraged her to continue to monitor, will have NP evaluate at upcoming [place] in person. Continue daily Lasix for now.    Patient Goals/Self-Care Activities Patient will:  - take medications as prescribed check blood pressure once weekly, document, and provide at future appointments engage in dietary modifications by limiting fried foods and sugar intake from juices and koolaid  Follow Up Plan: The care management team will reach out to the patient again over the next 6 months days.           Patient verbalizes understanding of instructions provided today and agrees to view in [place].  Telephone follow up appointment with pharmacy team member scheduled for: 6 months  Edythe Clarity, [place]

## 2021-02-07 NOTE — Progress Notes (Signed)
Chronic Care Management Pharmacy Note  02/07/2021 Name:  Tiffany Schaefer MRN:  [id number] DOB:  07/31/1950  Subjective: Tiffany Schaefer is an 70 y.o. year old female who is a primary patient of Humphrey Rolls, Timoteo Gaul, MD.  The CCM team was consulted for assistance with disease management and care coordination needs.    Engaged with patient by telephone for follow up visit in response to provider referral for pharmacy case management and/or care coordination services.   Consent to Services:  The patient was given the following information about Chronic Care Management services today, agreed to services, and gave verbal consent: 1. CCM service includes personalized support from designated clinical staff supervised by the primary care provider, including individualized plan of care and coordination with other care providers 2. 24/7 contact phone numbers for assistance for urgent and routine care needs. 3. Service will only be billed when office clinical staff spend 20 minutes or more in a month to coordinate care. 4. Only one practitioner may furnish and bill the service in a calendar month. 5.The patient may stop CCM services at any time (effective at the end of the month) by phone call to the office staff. 6. The patient will be responsible for cost sharing (co-pay) of up to 20% of the service fee (after annual deductible is met). Patient agreed to services and consent obtained.  Patient Care Team: Lavera Guise, MD as PCP - General (Internal Medicine) Christene Lye, MD (General Surgery) Edythe Clarity, [facility] as Pharmacist (Pharmacist)  Recent office visits:  None since 10/18/20   Recent consult visits:  None since 10/18/20   Hospital visits:  None since 10/18/20  Objective:  Lab Results  Component Value Date   CREATININE 0.86 05/04/2020   BUN 22 05/04/2020   GFRNONAA 70 05/04/2020   GFRAA 81 05/04/2020   NA 143 05/04/2020   K 4.8 05/04/2020   CALCIUM 8.8 05/04/2020   CO2 15 (L)  05/04/2020   GLUCOSE 59 (L) 05/04/2020    No results found for: HGBA1C, FRUCTOSAMINE, GFR, MICROALBUR  Last diabetic Eye exam: No results found for: HMDIABEYEEXA  Last diabetic Foot exam: No results found for: HMDIABFOOTEX   Lab Results  Component Value Date   CHOL 175 05/04/2020   HDL 46 05/04/2020   LDLCALC 111 (H) 05/04/2020   TRIG 100 05/04/2020    Hepatic Function Latest Ref Rng & Units 04/26/2019 04/23/2018  Total Protein 6.0 - 8.5 g/dL 7.8 7.5  Albumin 3.8 - 4.8 g/dL 3.5(L) 3.7  AST 0 - 40 IU/L 15 14  ALT 0 - 32 IU/L 11 9  Alk Phosphatase 39 - 117 IU/L 52 43  Total Bilirubin 0.0 - 1.2 mg/dL 0.3 0.3    Lab Results  Component Value Date/Time   TSH 1.840 05/04/2020 09:22 AM   TSH 1.590 04/26/2019 10:03 AM   FREET4 1.20 05/04/2020 09:22 AM   FREET4 1.22 04/26/2019 10:03 AM    CBC Latest Ref Rng & Units 05/04/2020 04/26/2019 04/23/2018  WBC 3.4 - 10.8 x10E3/uL 5.9 6.7 7.9  Hemoglobin 11.1 - 15.9 g/dL 12.2 13.9 12.9  Hematocrit 34.0 - 46.6 % 37.9 41.7 39.6  Platelets 150 - 450 x10E3/uL 192 260 301    Lab Results  Component Value Date/Time   VD25OH 17.4 (L) 05/04/2020 09:22 AM   VD25OH 19.8 (L) 04/26/2019 10:03 AM    Clinical ASCVD: No  The 10-year ASCVD risk score (Arnett DK, et al., 2019) is: 15.3%   Values used  to calculate the score:     Age: 74 years     Sex: Female     Is Non-Hispanic African American: Yes     Diabetic: No     Tobacco smoker: Yes     Systolic Blood Pressure: 324 mmHg     Is BP treated: Yes     HDL Cholesterol: 46 mg/dL     Total Cholesterol: 175 mg/dL    Depression screen [facility] 2/9 08/31/2020 03/02/2020 08/27/2019  Decreased Interest 0 0 0  Down, Depressed, Hopeless 0 0 0  PHQ - 2 Score 0 0 0  Altered sleeping - - -  Tired, decreased energy - - -  Change in appetite - - -  Feeling bad or failure about yourself  - - -  Trouble concentrating - - -  Moving slowly or fidgety/restless - - -  Suicidal thoughts - - -  PHQ-9 Score - - -   Difficult doing work/chores - - -     Social History   Tobacco Use  Smoking Status Every Day   Packs/day: 1.00   Years: 30.00   Pack years: 30.00   Types: Cigarettes  Smokeless Tobacco Never   BP Readings from Last 3 Encounters:  08/31/20 117/62  03/02/20 133/86  08/27/19 134/75   Pulse Readings from Last 3 Encounters:  08/31/20 85  03/02/20 (!) 107  08/27/19 92   Wt Readings from Last 3 Encounters:  08/31/20 227 lb 12.8 oz (103.3 kg)  03/02/20 235 lb (106.6 kg)  08/27/19 241 lb 6.4 oz (109.5 kg)   BMI Readings from Last 3 Encounters:  08/31/20 40.35 kg/m  03/02/20 41.63 kg/m  08/27/19 42.76 kg/m    Assessment/Interventions: Review of patient past medical history, allergies, medications, health status, including review of consultants reports, laboratory and other test data, was performed as part of comprehensive evaluation and provision of chronic care management services.   SDOH:  (Social Determinants of Health) assessments and interventions performed: Yes   Financial Resource Strain: Low Risk    Difficulty of Paying Living Expenses: Not hard at all    SDOH Screenings   Alcohol Screen: Not on file  Depression (PHQ2-9): Low Risk    PHQ-2 Score: 0  Financial Resource Strain: Low Risk    Difficulty of Paying Living Expenses: Not hard at all  Food Insecurity: Not on file  Housing: Not on file  Physical Activity: Not on file  Social Connections: Not on file  Stress: Not on file  Tobacco Use: High Risk   Smoking Tobacco Use: Every Day   Smokeless Tobacco Use: Never  Transportation Needs: Not on file    [place]  No Known Allergies  Medications Reviewed Today     Reviewed by Edythe Clarity, [facility] (Pharmacist) on 02/07/21 at 1533  Med List Status: <None>   Medication Order Taking? Sig Documenting Provider Last Dose Status Informant  albuterol (VENTOLIN HFA) 108 (90 Base) MCG/ACT inhaler [id number] Yes INHALE 2 PUFFS BY MOUTH INTO THE LUNGS  EVERY 6 HOURS AS NEEDED FOR WHEEZING OR SHORTNESS OF BREATH Lavera Guise, MD Taking Active   aspirin EC 81 MG tablet [id number] Yes Take 81 mg by mouth daily with lunch.  [provider] Taking Active Self  benzonatate (TESSALON) 100 MG capsule [id number] Yes Take 1 capsule (100 mg total) by mouth 2 (two) times daily as needed for cough. Luiz Ochoa, NP Taking Active   bisacodyl (DULCOLAX) 5 MG EC tablet [id number] Yes Take  5 mg by mouth daily as needed for moderate constipation. [provider] Taking Active   carvedilol (COREG) 25 MG tablet [id number] Yes Take 1 tablet (25 mg total) by mouth daily with lunch. Lavera Guise, MD Taking Active   furosemide (LASIX) 20 MG tablet [id number] Yes Take 1 tablet (20 mg total) by mouth daily. Ronnell Freshwater, NP Taking Active   losartan-hydrochlorothiazide (HYZAAR) 100-25 MG tablet [id number] Yes TAKE ONE TABLET BY MOUTH ONCE DAILY WITH LUNCH Boscia, Heather E, NP Taking Active   lovastatin (MEVACOR) 40 MG tablet [id number] Yes Take 1 tablet (40 mg total) by mouth at bedtime. Ronnell Freshwater, NP Taking Active   potassium chloride SA (KLOR-CON) 20 MEQ tablet [id number] Yes Take 1 tablet (20 mEq total) by mouth daily. Lavera Guise, MD Taking Active   triamcinolone cream (KENALOG) 0.5 % [id number] Yes Apply topically 2 (two) times daily. Ronnell Freshwater, NP Taking Active             Patient Active Problem List   Diagnosis Date Noted   Atherosclerosis of artery of both lower extremities ([place]) 05/05/2019   Needs flu shot 05/05/2019   Encounter for general adult medical examination with abnormal findings 03/14/2019   Acute right ankle pain 03/14/2019   Dysuria 03/14/2019   Wheezing 11/24/2018   Acute upper respiratory infection 10/21/2018   Cough 10/21/2018   Atopic dermatitis 08/21/2018   Screening for breast cancer 11/09/2017   Peripheral edema 10/20/2017   Hypokalemia 10/20/2017   Leiomyoma of uterus, unspecified 06/09/2017    Cigarette nicotine dependence without complication 85/92/9244   Post-menopausal bleeding 04/21/2017   Hypertension 04/21/2017   Mixed hyperlipidemia 04/21/2017   Cardiac arrhythmia 04/21/2017   Edema 04/21/2017    Immunization History  Administered Date(s) Administered   Influenza Inj Mdck Quad Pf 05/04/2019, 03/02/2020   PFIZER(Purple Top)SARS-COV-2 Vaccination 07/20/2019, 08/10/2019    Conditions to be addressed/monitored:  HTN, HLD, Peripheral Edema, Nicotine Dependence.  Care Plan : General Pharmacy (Adult)  Updates made by Edythe Clarity, RPH since 02/07/2021 12:00 AM     Problem: Edema, HTn, HLD   Priority: High  Onset Date: 08/01/2020     Long-Range Goal: Patient-Specific Goal   Start Date: 02/07/2021  Expected End Date: 08/08/2021  This Visit's Progress: On track  Priority: High  Note:   Current Barriers:  No specific barriers identified at this visit.  Pharmacist Clinical Goal(s):  Patient will achieve adherence to monitoring guidelines and medication adherence to achieve therapeutic efficacy maintain control of blood pressure as evidenced by home monitoring  adhere to prescribed medication regimen as evidenced by fill dates through collaboration with PharmD and provider.   Interventions: 1:1 collaboration with Lavera Guise, MD regarding development and update of comprehensive plan of care as evidenced by provider attestation and co-signature Inter-disciplinary care team collaboration (see longitudinal plan of care) Comprehensive medication review performed; medication list updated in electronic medical record  Hypertension (BP goal <140/90) -Controlled -Current treatment: Losartan-HCTZ 100/65m daily Carvedilol 229mdaily -Medications previously tried: none noted -Current home readings: unknown, patient does not have access to a cuff -Current dietary habits: patient eats whatever she has at the time.  Chicken nuggets and rice today with her  grandkids -Current exercise habits: minimal -Reports hypotensive/hypertensive symptoms - slight headache occasionally, mentions short spell of dizziness yesterday -Educated on BP goals and benefits of medications for prevention of heart attack, stroke and kidney damage; Importance of home blood pressure monitoring; Symptoms of hypotension and  importance of maintaining adequate hydration; -Counseled to monitor BP at home at least once per week, document, and provide log at future appointments -Recommended to continue current medication Recommended to go slow when moving from sitting to standing position.  Update 02/07/21  133/80 was her BP this morning.  She reports it is normally around this at home.  She continues same medications.  Has upcoming appointment end of this month for annual wellness - she states she will be out of her losartan/HCTZ by this time and asks if we can send a refill in for her. Will consult with nurses to have new Rx sent in so she does not go without meds.  Encouraged her to keep monitoring BP - bring logs in to office visit end of month.  Hyperlipidemia: (LDL goal < 100) -Not ideally controlled -Current treatment: Lovastatin 21m daily -Medications previously tried: none noted  -Current dietary patterns: see above -Current exercise habits: see above -Educated on Cholesterol goals;  Benefits of statin for ASCVD risk reduction; Importance of limiting foods high in cholesterol; Proper administration times for statin medications.  -She is currently taking mid day.   -Recommended to continue current medication Recommended she try taking her Lovastatin at bedtime.  However, if this causes problems with adherence I encouraged her that I would rather her take it daily in the afternoon than miss doses at night time.  Tobacco use (Goal Minimize smoking/smoking cessation) -Uncontrolled -Previous quit attempts: none -Current treatment  -Patient not interested in  smoking cessation at this time. -Encouraged her to reach out if she needs support and feels it is the right time for her to start.    Peripheral Edema (Goal: Reduce swelling/minimize symptoms) -Controlled -Current treatment  Furosemide 216m-Medications previously tried: none noted -Patient mentions her R ankle will swell sometimes throughout the day -She is adherence with her Lasix and elevated feet at night when she lays down.  This usually resolves all swelling.  -Recommended to continue current medication  Update 02/07/21 Still having some edema.  It is not red, painful or warm to the touch. Resolves with Lasix.  Encouraged her to continue to monitor, will have NP evaluate at upcoming [place] person. Continue daily Lasix for now.    Patient Goals/Self-Care Activities Patient will:  - take medications as prescribed check blood pressure once weekly, document, and provide at future appointments engage in dietary modifications by limiting fried foods and sugar intake from juices and koolaid  Follow Up Plan: The care management team will reach out to the patient again over the next 6 months days.            Medication Assistance: None required.  Patient affirms current coverage meets needs.  Patient's preferred pharmacy is:  [place]one: [phone]: [address]hone: [phone]: [id number] [place] [place] [address] [id number]: [phone]: [phone] pill box? No - has all of her vials organized in a cabinet Pt endorses 100% compliance  We discussed: Benefits of medication synchronization, packaging and delivery as well as enhanced pharmacist oversight with [employer]. Patient  decided to: Continue current medication management strategy  Care Plan  and Follow Up Patient Decision:  Patient agrees to Care Plan and Follow-up.  Plan: The care management team will reach out to the patient again over the next 180 days.  Beverly Milch, PharmD Clinical Pharmacist [place] [phone]

## 2021-02-08 ENCOUNTER — Other Ambulatory Visit: Payer: Self-pay

## 2021-02-08 DIAGNOSIS — I10 Essential (primary) hypertension: Secondary | ICD-10-CM

## 2021-02-08 MED ORDER — LOSARTAN POTASSIUM-HCTZ 100-25 MG PO TABS
ORAL_TABLET | ORAL | 1 refills | Status: DC
Start: 2021-02-08 — End: 2021-06-20

## 2021-02-28 ENCOUNTER — Other Ambulatory Visit: Payer: Self-pay | Admitting: Nurse Practitioner

## 2021-02-28 DIAGNOSIS — R609 Edema, unspecified: Secondary | ICD-10-CM

## 2021-03-02 ENCOUNTER — Other Ambulatory Visit: Payer: Self-pay | Admitting: Nurse Practitioner

## 2021-03-02 DIAGNOSIS — R609 Edema, unspecified: Secondary | ICD-10-CM

## 2021-03-05 ENCOUNTER — Encounter: Payer: Self-pay | Admitting: Nurse Practitioner

## 2021-03-05 ENCOUNTER — Other Ambulatory Visit: Payer: Self-pay

## 2021-03-05 ENCOUNTER — Ambulatory Visit (INDEPENDENT_AMBULATORY_CARE_PROVIDER_SITE_OTHER): Payer: Medicare Other | Admitting: Nurse Practitioner

## 2021-03-05 VITALS — BP 119/69 | HR 77 | Temp 98.2°F | Resp 16 | Ht 63.0 in | Wt 227.6 lb

## 2021-03-05 DIAGNOSIS — R300 Dysuria: Secondary | ICD-10-CM | POA: Diagnosis not present

## 2021-03-05 DIAGNOSIS — R062 Wheezing: Secondary | ICD-10-CM | POA: Diagnosis not present

## 2021-03-05 DIAGNOSIS — E782 Mixed hyperlipidemia: Secondary | ICD-10-CM | POA: Diagnosis not present

## 2021-03-05 DIAGNOSIS — L209 Atopic dermatitis, unspecified: Secondary | ICD-10-CM | POA: Diagnosis not present

## 2021-03-05 DIAGNOSIS — R600 Localized edema: Secondary | ICD-10-CM

## 2021-03-05 DIAGNOSIS — R609 Edema, unspecified: Secondary | ICD-10-CM | POA: Diagnosis not present

## 2021-03-05 DIAGNOSIS — E876 Hypokalemia: Secondary | ICD-10-CM

## 2021-03-05 DIAGNOSIS — I10 Essential (primary) hypertension: Secondary | ICD-10-CM | POA: Diagnosis not present

## 2021-03-05 DIAGNOSIS — Z0001 Encounter for general adult medical examination with abnormal findings: Secondary | ICD-10-CM | POA: Diagnosis not present

## 2021-03-05 DIAGNOSIS — E559 Vitamin D deficiency, unspecified: Secondary | ICD-10-CM

## 2021-03-05 DIAGNOSIS — Z23 Encounter for immunization: Secondary | ICD-10-CM

## 2021-03-05 MED ORDER — LOVASTATIN 40 MG PO TABS: 40.0000 mg | ORAL_TABLET | Freq: Every day | ORAL | 3 refills | Status: DC

## 2021-03-05 MED ORDER — CARVEDILOL 25 MG PO TABS: 25.0000 mg | ORAL_TABLET | Freq: Every day | ORAL | 1 refills | Status: DC

## 2021-03-05 MED ORDER — ALBUTEROL SULFATE HFA 108 (90 BASE) MCG/ACT IN AERS: 2.0000 | INHALATION_SPRAY | Freq: Four times a day (QID) | RESPIRATORY_TRACT | 3 refills | Status: DC | PRN

## 2021-03-05 MED ORDER — TRIAMCINOLONE ACETONIDE 0.5 % EX CREA: TOPICAL_CREAM | Freq: Two times a day (BID) | CUTANEOUS | 1 refills | Status: DC

## 2021-03-05 MED ORDER — POTASSIUM CHLORIDE CRYS ER 20 MEQ PO TBCR: 20.0000 meq | EXTENDED_RELEASE_TABLET | Freq: Every day | ORAL | 0 refills | Status: DC

## 2021-03-05 MED ORDER — FUROSEMIDE 20 MG PO TABS: 20.0000 mg | ORAL_TABLET | Freq: Every day | ORAL | 1 refills | Status: DC

## 2021-03-05 NOTE — Progress Notes (Signed)
[facility] [place],  [address]  Internal MEDICINE  Office Visit Note  Patient Name: Tiffany Schaefer  [id number]  [id number]  Date of Service: 03/05/2021  Chief Complaint  Patient presents with   Medicare Wellness    refills   Sleep Apnea   Hyperlipidemia   Hypertension    HPI Cherlynn presents for an annual well visit and physical exam. She has hypertension, hyperlipidemia, and sleep apnea. She did the cologard stool test in December which was negative. Her most recent mammogram was in august and was BI-RADS 2 benign. She has declined recommended vaccinations.  Her blood pressure is normal. She takes carvedilol and losartan-hydrochlorothiazide She has a chronic cough.  She reports a painless swollen lump on the left side of the neck.      Current Medication: Outpatient Encounter Medications as of 03/05/2021  Medication Sig   aspirin EC 81 MG tablet Take 81 mg by mouth daily with lunch.    bisacodyl (DULCOLAX) 5 MG EC tablet Take 5 mg by mouth daily as needed for moderate constipation.   losartan-hydrochlorothiazide (HYZAAR) 100-25 MG tablet TAKE ONE TABLET BY MOUTH ONCE DAILY WITH LUNCH   [DISCONTINUED] albuterol (VENTOLIN HFA) 108 (90 Base) MCG/ACT inhaler INHALE 2 PUFFS BY MOUTH INTO THE LUNGS EVERY 6 HOURS AS NEEDED FOR WHEEZING OR SHORTNESS OF BREATH   [DISCONTINUED] benzonatate (TESSALON) 100 MG capsule Take 1 capsule (100 mg total) by mouth 2 (two) times daily as needed for cough.   [DISCONTINUED] carvedilol (COREG) 25 MG tablet Take 1 tablet (25 mg total) by mouth daily with lunch.   [DISCONTINUED] furosemide (LASIX) 20 MG tablet Take 1 tablet (20 mg total) by mouth daily.   [DISCONTINUED] lovastatin (MEVACOR) 40 MG tablet Take 1 tablet (40 mg total) by mouth at bedtime.   [DISCONTINUED] potassium chloride SA (KLOR-CON) 20 MEQ tablet Take 1 tablet (20 mEq total) by mouth daily.   [DISCONTINUED] triamcinolone cream (KENALOG) 0.5 % Apply  topically 2 (two) times daily.   albuterol (VENTOLIN HFA) 108 (90 Base) MCG/ACT inhaler Inhale 2 puffs into the lungs every 6 (six) hours as needed for wheezing or shortness of breath.   carvedilol (COREG) 25 MG tablet Take 1 tablet (25 mg total) by mouth daily with lunch.   furosemide (LASIX) 20 MG tablet Take 1 tablet (20 mg total) by mouth daily.   lovastatin (MEVACOR) 40 MG tablet Take 1 tablet (40 mg total) by mouth at bedtime.   potassium chloride SA (KLOR-CON) 20 MEQ tablet Take 1 tablet (20 mEq total) by mouth daily.   triamcinolone cream (KENALOG) 0.5 % Apply topically 2 (two) times daily.   No facility-administered encounter medications on file as of 03/05/2021.    Surgical History: Past Surgical History:  Procedure Laterality Date   CESAREAN SECTION     denies 09/03/17   COLONOSCOPY     HEMORRHOID SURGERY     HYSTEROSCOPY WITH D & C N/A 09/11/2017   Procedure: DILATATION AND CURETTAGE /HYSTEROSCOPY;  Surgeon: Malachy Mood, MD;  Location: [facility] ORS;  Service: Gynecology;  Laterality: N/A;    Medical History: Past Medical History:  Diagnosis Date   Arthritis    Hyperlipidemia    Hypertension    Sinus infection    Sleep apnea     Family History: Family History  Problem Relation Age of Onset   Stroke Mother    Diabetes Brother    Rheumatic fever Brother    Arthritis/Rheumatoid Brother  Social History   Socioeconomic History   Marital status: Married    Spouse name: Not on file   Number of children: Not on file   Years of education: Not on file   Highest education level: Not on file  Occupational History   Not on file  Tobacco Use   Smoking status: Every Day    Packs/day: 1.00    Years: 30.00    Pack years: 30.00    Types: Cigarettes   Smokeless tobacco: Never  Vaping Use   Vaping Use: Never used  Substance and Sexual Activity   Alcohol use: Yes    Alcohol/week: 0.0 standard drinks    Comment: occasionally   Drug use: No   Sexual activity: Not  Currently    Birth control/protection: None  Other Topics Concern   Not on file  Social History Narrative   Not on file   Social Determinants of Health   Financial Resource Strain: Low Risk    Difficulty of Paying Living Expenses: Not hard at all  Food Insecurity: Not on file  Transportation Needs: Not on file  Physical Activity: Not on file  Stress: Not on file  Social Connections: Not on file  Intimate Partner Violence: Not on file      Review of Systems  Constitutional:  Negative for activity change, appetite change, chills, fatigue, fever and unexpected weight change.  HENT:  Negative for congestion, ear pain, rhinorrhea, sneezing, sore throat and trouble swallowing.   Eyes: Negative.  Negative for redness.  Respiratory: Negative.  Negative for cough, chest tightness, shortness of breath and wheezing.   Cardiovascular: Negative.  Negative for chest pain and palpitations.  Gastrointestinal: Negative.  Negative for abdominal pain, blood in stool, constipation, diarrhea, nausea and vomiting.  Endocrine: Negative.   Genitourinary: Negative.  Negative for difficulty urinating, dysuria, frequency, hematuria and urgency.  Musculoskeletal: Negative.  Negative for arthralgias, back pain, joint swelling, myalgias and neck pain.  Skin: Negative.  Negative for rash and wound.  Allergic/Immunologic: Negative.  Negative for immunocompromised state.  Neurological: Negative.  Negative for dizziness, tremors, seizures, numbness and headaches.  Hematological: Negative.  Negative for adenopathy. Does not bruise/bleed easily.  Psychiatric/Behavioral: Negative.  Negative for behavioral problems (Depression), self-injury, sleep disturbance and suicidal ideas. The patient is not nervous/anxious.    Vital Signs: BP 119/69   Pulse 77   Temp 98.2 F (36.8 C)   Resp 16   Ht _0  (1.6 m)   Wt 227 lb 9.6 oz (103.2 kg)   SpO2 99%   BMI 40.32 kg/m    Physical Exam Vitals reviewed.   Constitutional:      General: She is awake. She is not in acute distress.    Appearance: Normal appearance. She is well-developed and well-groomed. She is obese. She is not ill-appearing or diaphoretic.  HENT:     Head: Normocephalic and atraumatic.     Right Ear: Tympanic membrane, ear canal and external ear normal.     Left Ear: Tympanic membrane, ear canal and external ear normal.     Nose: Nose normal.     Mouth/Throat:     Lips: Pink.     Mouth: Mucous membranes are moist.     Pharynx: Oropharynx is clear. Uvula midline. No oropharyngeal exudate or posterior oropharyngeal erythema.  Eyes:     General: Lids are normal. Vision grossly intact. Gaze aligned appropriately. No scleral icterus.       Right eye: No discharge.  Left eye: No discharge.     Extraocular Movements: Extraocular movements intact.     Conjunctiva/sclera: Conjunctivae normal.     Pupils: Pupils are equal, round, and reactive to light.     Funduscopic exam:    Right eye: Red reflex present.        Left eye: Red reflex present. Neck:     Thyroid: No thyromegaly.     Vascular: No JVD.     Trachea: No tracheal deviation.  Cardiovascular:     Rate and Rhythm: Normal rate and regular rhythm.     Heart sounds: Normal heart sounds, S1 normal and S2 normal. No murmur heard.   No friction rub. No gallop.  Pulmonary:     Effort: Pulmonary effort is normal. No accessory muscle usage or respiratory distress.     Breath sounds: Normal breath sounds and air entry. No stridor. No wheezing or rales.  Chest:     Chest wall: No tenderness.     Comments: Declined clinical breast exams, gets annual mammograms.  Abdominal:     General: Bowel sounds are normal. There is no distension.     Palpations: Abdomen is soft. There is no shifting dullness, fluid wave, mass or pulsatile mass.     Tenderness: There is no abdominal tenderness. There is no guarding or rebound.  Musculoskeletal:        General: No tenderness or  deformity. Normal range of motion.     Cervical back: Normal range of motion and neck supple.     Right lower leg: No edema.     Left lower leg: No edema.  Lymphadenopathy:     Cervical: No cervical adenopathy.  Skin:    General: Skin is warm and dry.     Coloration: Skin is not pale.     Findings: No erythema or rash.  Neurological:     Mental Status: She is alert.     Cranial Nerves: No cranial nerve deficit.     Motor: No abnormal muscle tone.     Coordination: Coordination normal.     Deep Tendon Reflexes: Reflexes are normal and symmetric.  Psychiatric:        Mood and Affect: Mood and affect normal.        Behavior: Behavior normal. Behavior is cooperative.        Thought Content: Thought content normal.        Judgment: Judgment normal.       Assessment/Plan: 1. Encounter for general adult medical examination with abnormal findings Age-appropriate preventive screenings and vaccinations discussed, annual physical exam completed. Routine labs for health maintenance ordered, see below. PHM updated.   2. Primary hypertension Carvedilol refill ordered, routine lab ordered.  - carvedilol (COREG) 25 MG tablet; Take 1 tablet (25 mg total) by mouth daily with lunch.  Dispense: 90 tablet; Refill: 1 - CBC with Differential/Platelet  3. Hypokalemia Takes potassium supplement, refill ordered, routine lab ordered.  - potassium chloride SA (KLOR-CON) 20 MEQ tablet; Take 1 tablet (20 mEq total) by mouth daily.  Dispense: 90 tablet; Refill: 0 - CMP14+EGFR  4. Mixed hyperlipidemia Taking lovastatin, refill ordered. Routine labs ordered.  - lovastatin (MEVACOR) 40 MG tablet; Take 1 tablet (40 mg total) by mouth at bedtime.  Dispense: 90 tablet; Refill: 3 - Lipid Profile - TSH + free T4  5. Atopic dermatitis, unspecified type Refill ordered.  - triamcinolone cream (KENALOG) 0.5 %; Apply topically 2 (two) times daily.  Dispense: 454 g; Refill: 1  6. Peripheral edema Routine lab  ordered, lasix refill ordered.  - furosemide (LASIX) 20 MG tablet; Take 1 tablet (20 mg total) by mouth daily.  Dispense: 90 tablet; Refill: 1 - CBC with Differential/Platelet  7. Wheezing Intermittent wheezing due to effects of continued use of tobacco products. Refill ordered.  - albuterol (VENTOLIN HFA) 108 (90 Base) MCG/ACT inhaler; Inhale 2 puffs into the lungs every 6 (six) hours as needed for wheezing or shortness of breath.  Dispense: 18 g; Refill: 3  8. Vitamin D deficiency Routine lab ordered. - Vitamin D (25 hydroxy)  9. Dysuria Routine urinalysis done - UA/M w/rflx Culture, Routine  10. Needs flu shot Administered in office today.  - Flu Vaccine MDCK QUAD PF      General Counseling: Clemma verbalizes understanding of the findings of todays visit and agrees with plan of treatment. I have discussed any further diagnostic evaluation that may be needed or ordered today. We also reviewed her medications today. she has been encouraged to call the office with any questions or concerns that should arise related to todays visit.    Orders Placed This Encounter  Procedures   Flu Vaccine MDCK QUAD PF   UA/M w/rflx Culture, Routine   CBC with Differential/Platelet   CMP14+EGFR   Lipid Profile   Vitamin D (25 hydroxy)   TSH + free T4    Meds ordered this encounter  Medications   potassium chloride SA (KLOR-CON) 20 MEQ tablet    Sig: Take 1 tablet (20 mEq total) by mouth daily.    Dispense:  90 tablet    Refill:  0    This prescription was filled on 06/20/2017. Any refills authorized will be placed on file.   lovastatin (MEVACOR) 40 MG tablet    Sig: Take 1 tablet (40 mg total) by mouth at bedtime.    Dispense:  90 tablet    Refill:  3   triamcinolone cream (KENALOG) 0.5 %    Sig: Apply topically 2 (two) times daily.    Dispense:  454 g    Refill:  1    Please fill as jar.   furosemide (LASIX) 20 MG tablet    Sig: Take 1 tablet (20 mg total) by mouth daily.     Dispense:  90 tablet    Refill:  1   carvedilol (COREG) 25 MG tablet    Sig: Take 1 tablet (25 mg total) by mouth daily with lunch.    Dispense:  90 tablet    Refill:  1   albuterol (VENTOLIN HFA) 108 (90 Base) MCG/ACT inhaler    Sig: Inhale 2 puffs into the lungs every 6 (six) hours as needed for wheezing or shortness of breath.    Dispense:  18 g    Refill:  3    Return in about 6 months (around 09/02/2021) for F/U, med refill, Lilienne Weins PCP.   Total time spent:30 Minutes Time spent includes review of chart, medications, test results, and follow up plan with the patient.   [place] Controlled Substance Database was reviewed by me.  This patient was seen by Jonetta Osgood, FNP-C in collaboration with Dr. Clayborn Bigness as a part of collaborative care agreement.  Abriel Geesey R. Valetta Fuller, MSN, FNP-C Internal medicine

## 2021-03-06 LAB — UA/M W/RFLX CULTURE, ROUTINE
Bilirubin, UA: NEGATIVE
Glucose, UA: NEGATIVE
Ketones, UA: NEGATIVE
Leukocytes,UA: NEGATIVE
Nitrite, UA: NEGATIVE
RBC, UA: NEGATIVE
Specific Gravity, UA: 1.027 (ref 1.005–1.030)
Urobilinogen, Ur: 1 mg/dL (ref 0.2–1.0)
pH, UA: 6 (ref 5.0–7.5)

## 2021-03-06 LAB — MICROSCOPIC EXAMINATION
Casts: NONE SEEN /lpf
Epithelial Cells (non renal): >10 /hpf — AB (ref 0–10)
RBC, Urine: NONE SEEN /hpf (ref 0–2)

## 2021-03-31 ENCOUNTER — Encounter: Payer: Self-pay | Admitting: Nurse Practitioner

## 2021-05-16 ENCOUNTER — Telehealth: Payer: Self-pay | Admitting: Student-PharmD

## 2021-05-16 NOTE — Progress Notes (Addendum)
General Review Call  ROSENE, PILLING [id number] 40 years, Female  DOB: Mar 06, 1951  M: [phone]  General Review Call Completed by Charlann Lange on 05/16/2021  Chart Review What recent interventions/DTPs have been made by any provider to improve the patient's conditions in the last 3 months?:   Office Visit: 03/05/21 Jonetta Osgood, NP. For encounter for general medical examination, STOPPED Benzonatate.  Any recent hospitalizations or ED visits since last visit with CPP?: No  Adherence Review Adherence rates for STAR metric medications:  Lovastatin 40 mg 11/27/20 90 DS, 08/29/20 90 DS. Losartan-Hydrochlorothiazide 100-25 mg 02/09/21 90 DS, 01/25/21 20 DS.  Adherence rates for medications indicated for disease state being reviewed: None.  Does the patient have >5 day gap between last estimated fill dates for any of the above medications?: Yes  Reasons for medication gaps: Lovastatin 40 mg 11/27/20 90 DS, 08/29/20 90 DS.  Disease State Questions Able to connect with the Patient?: Yes  Any concerns about health that we can address? No  Charlann Lange, RMA Clinical Pharmacist Assistant [phone]  5 minutes spent in review, coordination, and documentation.  Reviewed by: Alena Bills, PharmD Clinical Pharmacist [phone]

## 2021-06-03 DIAGNOSIS — I10 Essential (primary) hypertension: Secondary | ICD-10-CM | POA: Diagnosis not present

## 2021-06-03 DIAGNOSIS — E782 Mixed hyperlipidemia: Secondary | ICD-10-CM | POA: Diagnosis not present

## 2021-06-19 DIAGNOSIS — H524 Presbyopia: Secondary | ICD-10-CM | POA: Diagnosis not present

## 2021-06-20 ENCOUNTER — Ambulatory Visit (INDEPENDENT_AMBULATORY_CARE_PROVIDER_SITE_OTHER): Payer: Medicare Other | Admitting: Nurse Practitioner

## 2021-06-20 ENCOUNTER — Encounter: Payer: Self-pay | Admitting: Nurse Practitioner

## 2021-06-20 ENCOUNTER — Other Ambulatory Visit: Payer: Self-pay

## 2021-06-20 VITALS — BP 140/76 | HR 84 | Temp 98.1°F | Resp 16 | Ht 63.0 in | Wt 229.2 lb

## 2021-06-20 DIAGNOSIS — R609 Edema, unspecified: Secondary | ICD-10-CM

## 2021-06-20 DIAGNOSIS — R062 Wheezing: Secondary | ICD-10-CM | POA: Diagnosis not present

## 2021-06-20 DIAGNOSIS — I10 Essential (primary) hypertension: Secondary | ICD-10-CM

## 2021-06-20 DIAGNOSIS — R221 Localized swelling, mass and lump, neck: Secondary | ICD-10-CM | POA: Diagnosis not present

## 2021-06-20 DIAGNOSIS — R040 Epistaxis: Secondary | ICD-10-CM | POA: Diagnosis not present

## 2021-06-20 DIAGNOSIS — E876 Hypokalemia: Secondary | ICD-10-CM

## 2021-06-20 DIAGNOSIS — L209 Atopic dermatitis, unspecified: Secondary | ICD-10-CM | POA: Diagnosis not present

## 2021-06-20 MED ORDER — FUROSEMIDE 20 MG PO TABS: 20.0000 mg | ORAL_TABLET | Freq: Every day | ORAL | 1 refills | Status: DC

## 2021-06-20 MED ORDER — ALBUTEROL SULFATE HFA 108 (90 BASE) MCG/ACT IN AERS: 2.0000 | INHALATION_SPRAY | Freq: Four times a day (QID) | RESPIRATORY_TRACT | 3 refills | Status: DC | PRN

## 2021-06-20 MED ORDER — LOSARTAN POTASSIUM-HCTZ 100-25 MG PO TABS: ORAL_TABLET | ORAL | 1 refills | Status: DC

## 2021-06-20 MED ORDER — CARVEDILOL 25 MG PO TABS: 25.0000 mg | ORAL_TABLET | Freq: Every day | ORAL | 1 refills | Status: DC

## 2021-06-20 MED ORDER — TRIAMCINOLONE ACETONIDE 0.5 % EX CREA: TOPICAL_CREAM | Freq: Two times a day (BID) | CUTANEOUS | 1 refills | Status: AC

## 2021-06-20 MED ORDER — POTASSIUM CHLORIDE CRYS ER 20 MEQ PO TBCR: 20.0000 meq | EXTENDED_RELEASE_TABLET | Freq: Every day | ORAL | 0 refills | Status: DC

## 2021-06-20 NOTE — Progress Notes (Unsigned)
[facility] [address], [place] [address]  Internal MEDICINE  Office Visit Note  Patient Name: Tiffany Schaefer  [id number]  [id number]  Date of Service: 06/20/2021  Chief Complaint  Patient presents with   Follow-up    Knot on neck left side   Sleep Apnea   Hyperlipidemia   Hypertension    HPI Tiffany Schaefer presents for a follow-up visit for  Saline nasal spray    Current Medication: Outpatient Encounter Medications as of 06/20/2021  Medication Sig   aspirin EC 81 MG tablet Take 81 mg by mouth daily with lunch.    bisacodyl (DULCOLAX) 5 MG EC tablet Take 5 mg by mouth daily as needed for moderate constipation.   lovastatin (MEVACOR) 40 MG tablet Take 1 tablet (40 mg total) by mouth at bedtime.   [DISCONTINUED] albuterol (VENTOLIN HFA) 108 (90 Base) MCG/ACT inhaler Inhale 2 puffs into the lungs every 6 (six) hours as needed for wheezing or shortness of breath.   [DISCONTINUED] carvedilol (COREG) 25 MG tablet Take 1 tablet (25 mg total) by mouth daily with lunch.   [DISCONTINUED] furosemide (LASIX) 20 MG tablet Take 1 tablet (20 mg total) by mouth daily.   [DISCONTINUED] losartan-hydrochlorothiazide (HYZAAR) 100-25 MG tablet TAKE ONE TABLET BY MOUTH ONCE DAILY WITH LUNCH   [DISCONTINUED] potassium chloride SA (KLOR-CON) 20 MEQ tablet Take 1 tablet (20 mEq total) by mouth daily.   [DISCONTINUED] triamcinolone cream (KENALOG) 0.5 % Apply topically 2 (two) times daily.   albuterol (VENTOLIN HFA) 108 (90 Base) MCG/ACT inhaler Inhale 2 puffs into the lungs every 6 (six) hours as needed for wheezing or shortness of breath.   carvedilol (COREG) 25 MG tablet Take 1 tablet (25 mg total) by mouth daily with lunch.   furosemide (LASIX) 20 MG tablet Take 1 tablet (20 mg total) by mouth daily.   losartan-hydrochlorothiazide (HYZAAR) 100-25 MG tablet TAKE ONE TABLET BY MOUTH ONCE DAILY WITH LUNCH   potassium chloride SA (KLOR-CON M) 20 MEQ tablet Take 1 tablet (20 mEq total) by  mouth daily.   triamcinolone cream (KENALOG) 0.5 % Apply topically 2 (two) times daily.   No facility-administered encounter medications on file as of 06/20/2021.    Surgical History: Past Surgical History:  Procedure Laterality Date   CESAREAN SECTION     denies 09/03/17   COLONOSCOPY     HEMORRHOID SURGERY     HYSTEROSCOPY WITH D & C N/A 09/11/2017   Procedure: DILATATION AND CURETTAGE /HYSTEROSCOPY;  Surgeon: Malachy Mood, MD;  Location: [facility] ORS;  Service: Gynecology;  Laterality: N/A;    Medical History: Past Medical History:  Diagnosis Date   Arthritis    Hyperlipidemia    Hypertension    Sinus infection    Sleep apnea     Family History: Family History  Problem Relation Age of Onset   Stroke Mother    Diabetes Brother    Rheumatic fever Brother    Arthritis/Rheumatoid Brother     Social History   Socioeconomic History   Marital status: Married    Spouse name: Not on file   Number of children: Not on file   Years of education: Not on file   Highest education level: Not on file  Occupational History   Not on file  Tobacco Use   Smoking status: Every Day    Packs/day: 1.00    Years: 30.00    Pack years: 30.00    Types: Cigarettes   Smokeless tobacco: Never  Vaping  Use   Vaping Use: Never used  Substance and Sexual Activity   Alcohol use: Yes    Alcohol/week: 0.0 standard drinks    Comment: occasionally   Drug use: No   Sexual activity: Not Currently    Birth control/protection: None  Other Topics Concern   Not on file  Social History Narrative   Not on file   Social Determinants of Health   Financial Resource Strain: Low Risk    Difficulty of Paying Living Expenses: Not hard at all  Food Insecurity: Not on file  Transportation Needs: Not on file  Physical Activity: Not on file  Stress: Not on file  Social Connections: Not on file  Intimate Partner Violence: Not on file      Review of Systems  Vital Signs: BP (!) 142/76    Pulse  84    Temp 98.1 F (36.7 C)    Resp 16    Ht 5\' 3"  (1.6 m)    Wt 229 lb 3.2 oz (104 kg)    SpO2 97%    BMI 40.60 kg/m    Physical Exam     Assessment/Plan:   General Counseling: Tiffany Schaefer verbalizes understanding of the findings of todays visit and agrees with plan of treatment. I have discussed any further diagnostic evaluation that may be needed or ordered today. We also reviewed her medications today. she has been encouraged to call the office with any questions or concerns that should arise related to todays visit.    Orders Placed This Encounter  Procedures   US Soft Tissue Head/Neck (NON-THYROID)    Meds ordered this encounter  Medications   losartan-hydrochlorothiazide (HYZAAR) 100-25 MG tablet    Sig: TAKE ONE TABLET BY MOUTH ONCE DAILY WITH LUNCH    Dispense:  90 tablet    Refill:  1   potassium chloride SA (KLOR-CON M) 20 MEQ tablet    Sig: Take 1 tablet (20 mEq total) by mouth daily.    Dispense:  90 tablet    Refill:  0    This prescription was filled on 06/20/2017. Any refills authorized will be placed on file.   carvedilol (COREG) 25 MG tablet    Sig: Take 1 tablet (25 mg total) by mouth daily with lunch.    Dispense:  90 tablet    Refill:  1   furosemide (LASIX) 20 MG tablet    Sig: Take 1 tablet (20 mg total) by mouth daily.    Dispense:  90 tablet    Refill:  1   albuterol (VENTOLIN HFA) 108 (90 Base) MCG/ACT inhaler    Sig: Inhale 2 puffs into the lungs every 6 (six) hours as needed for wheezing or shortness of breath.    Dispense:  18 g    Refill:  3   triamcinolone cream (KENALOG) 0.5 %    Sig: Apply topically 2 (two) times daily.    Dispense:  454 g    Refill:  1    Please fill as jar.    Return for F/U, U/S @ Randa Ngo PCP.   Total time spent:*** Minutes Time spent includes review of chart, medications, test results, and follow up plan with the patient.   [place] Controlled Substance Database was reviewed by me.  This patient was seen by  Tiffany Osgood, FNP-C in collaboration with Dr. Clayborn Schaefer as a part of collaborative care agreement.   Tiffany Faughnan R. Valetta Fuller, MSN, FNP-C Internal medicine

## 2021-07-18 ENCOUNTER — Telehealth: Payer: Self-pay

## 2021-07-18 NOTE — Telephone Encounter (Signed)
Left vm to confirm 07/25/21 appointment-Neal ?

## 2021-07-25 ENCOUNTER — Other Ambulatory Visit: Payer: Self-pay

## 2021-07-25 ENCOUNTER — Ambulatory Visit: Payer: Medicare Other

## 2021-07-25 DIAGNOSIS — R221 Localized swelling, mass and lump, neck: Secondary | ICD-10-CM

## 2021-07-28 ENCOUNTER — Encounter: Payer: Self-pay | Admitting: Nurse Practitioner

## 2021-08-06 ENCOUNTER — Telehealth: Payer: Self-pay

## 2021-08-06 ENCOUNTER — Encounter: Payer: Self-pay | Admitting: Nurse Practitioner

## 2021-08-06 ENCOUNTER — Ambulatory Visit: Payer: Medicare Other | Admitting: Nurse Practitioner

## 2021-08-06 ENCOUNTER — Ambulatory Visit (INDEPENDENT_AMBULATORY_CARE_PROVIDER_SITE_OTHER): Payer: Medicare Other | Admitting: Nurse Practitioner

## 2021-08-06 VITALS — BP 122/80 | HR 90 | Temp 98.0°F | Resp 16 | Ht 63.0 in | Wt 225.6 lb

## 2021-08-06 DIAGNOSIS — K118 Other diseases of salivary glands: Secondary | ICD-10-CM

## 2021-08-06 DIAGNOSIS — I10 Essential (primary) hypertension: Secondary | ICD-10-CM

## 2021-08-06 NOTE — Telephone Encounter (Signed)
Routine soft tissue neck CT ordered. Printed. Gave to Titania-Nyonna ?

## 2021-08-06 NOTE — Progress Notes (Signed)
[facility] ?[address] ?[place], [place] [address] ? ?Internal MEDICINE  ?Office Visit Note ? ?Patient Name: Tiffany Schaefer ? [id number]  ?[id number] ? ?Date of Service: 08/06/2021 ? ?Chief Complaint  ?Patient presents with  ? Hyperlipidemia  ? Hypertension  ? Follow-up  ? Results  ?  [place]  ? ? ?HPI ?Tiffany Schaefer presents for a follow up visit to review ultrasound results. 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 Discussed this finding and recommendation with the patient.  ?She does not need any refills at this time.  ? ? ? ?Current Medication: ?Outpatient Encounter Medications as of 08/06/2021  ?Medication Sig  ? albuterol (VENTOLIN HFA) 108 (90 Base) MCG/ACT inhaler Inhale 2 puffs into the lungs every 6 (six) hours as needed for wheezing or shortness of breath.  ? aspirin EC 81 MG tablet Take 81 mg by mouth daily with lunch.   ? bisacodyl (DULCOLAX) 5 MG EC tablet Take 5 mg by mouth daily as needed for moderate constipation.  ? carvedilol (COREG) 25 MG tablet Take 1 tablet (25 mg total) by mouth daily with lunch.  ? furosemide (LASIX) 20 MG tablet Take 1 tablet (20 mg total) by mouth daily.  ? losartan-hydrochlorothiazide (HYZAAR) 100-25 MG tablet TAKE ONE TABLET BY MOUTH ONCE DAILY WITH LUNCH  ? lovastatin (MEVACOR) 40 MG tablet Take 1 tablet (40 mg total) by mouth at bedtime.  ? potassium chloride SA (KLOR-CON M) 20 MEQ tablet Take 1 tablet (20 mEq total) by mouth daily.  ? triamcinolone cream (KENALOG) 0.5 % Apply topically 2 (two) times daily.  ? ?No facility-administered encounter medications on file as of 08/06/2021.  ? ? ?Surgical History: ?Past Surgical History:  ?Procedure Laterality Date  ? CESAREAN SECTION    ? denies 09/03/17  ? COLONOSCOPY    ? HEMORRHOID SURGERY    ? HYSTEROSCOPY WITH D & C N/A 09/11/2017  ? Procedure: DILATATION AND CURETTAGE /HYSTEROSCOPY;  Surgeon: Tiffany Mood, MD;  Location: [facility] ORS;  Service: Gynecology;   Laterality: N/A;  ? ? ?Medical History: ?Past Medical History:  ?Diagnosis Date  ? Arthritis   ? Hyperlipidemia   ? Hypertension   ? Sinus infection   ? Sleep apnea   ? ? ?Family History: ?Family History  ?Problem Relation Age of Onset  ? Stroke Mother   ? Diabetes Brother   ? Rheumatic fever Brother   ? Arthritis/Rheumatoid Brother   ? ? ?Social History  ? ?Socioeconomic History  ? Marital status: Married  ?  Spouse name: Not on file  ? Number of children: Not on file  ? Years of education: Not on file  ? Highest education level: Not on file  ?Occupational History  ? Not on file  ?Tobacco Use  ? Smoking status: Every Day  ?  Packs/day: 1.00  ?  Years: 30.00  ?  Pack years: 30.00  ?  Types: Cigarettes  ? Smokeless tobacco: Never  ?Vaping Use  ? Vaping Use: Never used  ?Substance and Sexual Activity  ? Alcohol use: Yes  ?  Alcohol/week: 0.0 standard drinks  ?  Comment: occasionally  ? Drug use: No  ? Sexual activity: Not Currently  ?  Birth control/protection: None  ?Other Topics Concern  ? Not on file  ?Social History Narrative  ? Not on file  ? ?Social Determinants of Health  ? ?Financial Resource Strain: Not on file  ?Food Insecurity: Not on  file  ?Transportation Needs: Not on file  ?Physical Activity: Not on file  ?Stress: Not on file  ?Social Connections: Not on file  ?Intimate Partner Violence: Not on file  ? ? ? ? ?Review of Systems  ?Constitutional:  Negative for chills, fatigue and unexpected weight change.  ?HENT:  Negative for congestion, rhinorrhea, sneezing and sore throat.   ?Eyes:  Negative for redness.  ?Respiratory:  Negative for cough, chest tightness and shortness of breath.   ?Cardiovascular:  Negative for chest pain and palpitations.  ?Gastrointestinal:  Negative for abdominal pain, constipation, diarrhea, nausea and vomiting.  ?Genitourinary:  Negative for dysuria and frequency.  ?Musculoskeletal:  Negative for arthralgias, back pain, joint swelling and neck pain.  ?Skin:  Negative for rash.   ?Neurological: Negative.  Negative for tremors and numbness.  ?Hematological:  Negative for adenopathy. Does not bruise/bleed easily.  ?Psychiatric/Behavioral:  Negative for behavioral problems (Depression), sleep disturbance and suicidal ideas. The patient is not nervous/anxious.   ? ?Vital Signs: ?BP 122/80   Pulse 90   Temp 98 ?F (36.7 ?C)   Resp 16   Ht '5\' 3"'$  (1.6 m)   Wt 225 lb 9.6 oz (102.3 kg)   SpO2 97%   BMI 39.96 kg/m?  ? ? ?Physical Exam ?Vitals reviewed.  ?Constitutional:   ?   General: She is not in acute distress. ?   Appearance: Normal appearance. She is obese. She is not ill-appearing.  ?HENT:  ?   Head: Normocephalic and atraumatic.  ?Eyes:  ?   Pupils: Pupils are equal, round, and reactive to light.  ?Cardiovascular:  ?   Rate and Rhythm: Normal rate and regular rhythm.  ?Pulmonary:  ?   Effort: Pulmonary effort is normal. No respiratory distress.  ?Musculoskeletal:  ?   Cervical back: Tenderness present.  ?Lymphadenopathy:  ?   Cervical: Cervical adenopathy present.  ?Neurological:  ?   Mental Status: She is alert and oriented to person, place, and time.  ?Psychiatric:     ?   Schaefer and Affect: Schaefer normal.     ?   Behavior: Behavior normal.  ? ? ? ? ? ?Assessment/Plan: ?1. Parotid mass ?Ultrasound results discussed, CT soft tissue neck recommended and discussed with the patient, she is agreeable but wants to know the out-of-pocket cost prior to the appointment for the imaging. ?- CT SOFT TISSUE NECK WO CONTRAST; Future ? ?2. Primary hypertension ?Stable, continue medications as prescribed. She does not need any refills at this time.  ? ? ?General Counseling: [name] verbalizes understanding of the findings of todays visit and agrees with plan of treatment. I have discussed any further diagnostic evaluation that may be needed or ordered today. We also reviewed her medications today. she has been encouraged to call the office with any questions or concerns that should arise related to todays  visit. ? ? ? ?Orders Placed This Encounter  ?Procedures  ? CT SOFT TISSUE NECK WO CONTRAST  ? ? ?No orders of the defined types were placed in this encounter. ? ? ?Return in 4 weeks (on 09/03/2021) for previously scheduled, F/U. ? ? ?Total time spent:20 Minutes ?Time spent includes review of chart, medications, test results, and follow up plan with the patient.  ? ?[place] Controlled Substance Database was reviewed by me. ? ?This patient was seen by Jonetta Osgood, FNP-C in collaboration with Dr. Clayborn Bigness as a part of collaborative care agreement. ? ? ?Vane Yapp R. Valetta Fuller, MSN, FNP-C ?Internal medicine  ?

## 2021-08-07 ENCOUNTER — Telehealth: Payer: Self-pay

## 2021-08-07 NOTE — Telephone Encounter (Signed)
Patient scheduled for ct soft tissue on 08/16/21 @ 2:30 kp.tat ?

## 2021-08-13 ENCOUNTER — Other Ambulatory Visit: Payer: Self-pay | Admitting: Internal Medicine

## 2021-08-13 DIAGNOSIS — I10 Essential (primary) hypertension: Secondary | ICD-10-CM

## 2021-08-15 ENCOUNTER — Telehealth: Payer: Self-pay

## 2021-08-16 ENCOUNTER — Ambulatory Visit: Payer: Medicare Other

## 2021-08-21 ENCOUNTER — Other Ambulatory Visit: Payer: Self-pay | Admitting: Nurse Practitioner

## 2021-08-21 DIAGNOSIS — R062 Wheezing: Secondary | ICD-10-CM

## 2021-08-23 ENCOUNTER — Ambulatory Visit
Admission: RE | Admit: 2021-08-23 | Discharge: 2021-08-23 | Disposition: A | Discharge status: Home / Self Care | Payer: Medicare Other | Source: Ambulatory Visit | Attending: Nurse Practitioner | Admitting: Nurse Practitioner

## 2021-08-23 DIAGNOSIS — R221 Localized swelling, mass and lump, neck: Secondary | ICD-10-CM | POA: Diagnosis not present

## 2021-08-23 DIAGNOSIS — K118 Other diseases of salivary glands: Secondary | ICD-10-CM | POA: Diagnosis not present

## 2021-08-23 DIAGNOSIS — I6529 Occlusion and stenosis of unspecified carotid artery: Secondary | ICD-10-CM | POA: Diagnosis not present

## 2021-08-29 ENCOUNTER — Other Ambulatory Visit: Payer: Self-pay | Admitting: Nurse Practitioner

## 2021-08-29 DIAGNOSIS — R609 Edema, unspecified: Secondary | ICD-10-CM

## 2021-08-29 DIAGNOSIS — I10 Essential (primary) hypertension: Secondary | ICD-10-CM

## 2021-08-30 NOTE — Progress Notes (Signed)
Will discuss results with patient next Monday. Likely ENT referral, discussed with Dr. Clayborn Bigness

## 2021-09-03 ENCOUNTER — Encounter: Payer: Self-pay | Admitting: Nurse Practitioner

## 2021-09-03 ENCOUNTER — Ambulatory Visit (INDEPENDENT_AMBULATORY_CARE_PROVIDER_SITE_OTHER): Payer: Medicare Other | Admitting: Nurse Practitioner

## 2021-09-03 VITALS — BP 118/88 | HR 111 | Temp 98.2°F | Resp 16 | Ht 63.0 in | Wt 226.2 lb

## 2021-09-03 DIAGNOSIS — E782 Mixed hyperlipidemia: Secondary | ICD-10-CM | POA: Diagnosis not present

## 2021-09-03 DIAGNOSIS — E876 Hypokalemia: Secondary | ICD-10-CM | POA: Diagnosis not present

## 2021-09-03 DIAGNOSIS — I10 Essential (primary) hypertension: Secondary | ICD-10-CM

## 2021-09-03 DIAGNOSIS — R002 Palpitations: Secondary | ICD-10-CM | POA: Diagnosis not present

## 2021-09-03 DIAGNOSIS — K118 Other diseases of salivary glands: Secondary | ICD-10-CM

## 2021-09-03 MED ORDER — CARVEDILOL 25 MG PO TABS: 25.0000 mg | ORAL_TABLET | Freq: Every day | ORAL | 3 refills | Status: DC

## 2021-09-03 MED ORDER — POTASSIUM CHLORIDE CRYS ER 20 MEQ PO TBCR: 20.0000 meq | EXTENDED_RELEASE_TABLET | Freq: Every day | ORAL | 1 refills | Status: DC

## 2021-09-03 MED ORDER — LOVASTATIN 40 MG PO TABS
40.0000 mg | ORAL_TABLET | Freq: Every day | ORAL | 3 refills | Status: DC
Start: 2021-09-03 — End: 2022-12-10

## 2021-09-03 NOTE — Progress Notes (Signed)
[facility] ?[address] ?[place], [place] [address] ? ?Internal MEDICINE  ?Office Visit Note ? ?Patient Name: Tiffany Schaefer ? [id number]  ?[id number] ? ?Date of Service: 09/03/2021 ? ?Chief Complaint  ?Patient presents with  ? Follow-up  ? Medication Refill  ? Results  ?  CT  ? Hypertension  ? Hyperlipidemia  ? ? ?HPI ?Tiffany Schaefer presents for a follow up visit to discuss the results of her CT scan of the neck.  ?The CT scan confirmed a 3cm left parotid mass that is suspicious and biopsy is recommended to rule out malignancy. Discussed results in detailed, patient's questions were answered. Patient is agreeable to ENT referral for biopsy of the mass.  ?Tiffany Schaefer also needs refills.  ? ?Current Medication: ?Outpatient Encounter Medications as of 09/03/2021  ?Medication Sig  ? albuterol (VENTOLIN HFA) 108 (90 Base) MCG/ACT inhaler Inhale 2 puffs into the lungs every 6 (six) hours as needed for wheezing or shortness of breath.  ? aspirin EC 81 MG tablet Take 81 mg by mouth daily with lunch.   ? bisacodyl (DULCOLAX) 5 MG EC tablet Take 5 mg by mouth daily as needed for moderate constipation.  ? furosemide (LASIX) 20 MG tablet TAKE ONE TABLET BY MOUTH ONCE DAILY  ? losartan-hydrochlorothiazide (HYZAAR) 100-25 MG tablet TAKE ONE TABLET BY MOUTH ONCE DAILY WITH LUNCH  ? triamcinolone cream (KENALOG) 0.5 % Apply topically 2 (two) times daily.  ? [DISCONTINUED] carvedilol (COREG) 25 MG tablet Take 1 tablet (25 mg total) by mouth daily with lunch.  ? [DISCONTINUED] lovastatin (MEVACOR) 40 MG tablet Take 1 tablet (40 mg total) by mouth at bedtime.  ? [DISCONTINUED] potassium chloride SA (KLOR-CON M) 20 MEQ tablet Take 1 tablet (20 mEq total) by mouth daily.  ? carvedilol (COREG) 25 MG tablet Take 1 tablet (25 mg total) by mouth daily with lunch.  ? lovastatin (MEVACOR) 40 MG tablet Take 1 tablet (40 mg total) by mouth at bedtime.  ? potassium chloride SA (KLOR-CON M) 20 MEQ tablet Take 1 tablet (20 mEq total) by mouth daily.  ? ?No  facility-administered encounter medications on file as of 09/03/2021.  ? ? ?Surgical History: ?Past Surgical History:  ?Procedure Laterality Date  ? CESAREAN SECTION    ? denies 09/03/17  ? COLONOSCOPY    ? HEMORRHOID SURGERY    ? HYSTEROSCOPY WITH D & C N/A 09/11/2017  ? Procedure: DILATATION AND CURETTAGE /HYSTEROSCOPY;  Surgeon: Malachy Mood, MD;  Location: [facility] ORS;  Service: Gynecology;  Laterality: N/A;  ? ? ?Medical History: ?Past Medical History:  ?Diagnosis Date  ? Arthritis   ? Hyperlipidemia   ? Hypertension   ? Sinus infection   ? Sleep apnea   ? ? ?Family History: ?Family History  ?Problem Relation Age of Onset  ? Stroke Mother   ? Diabetes Brother   ? Rheumatic fever Brother   ? Arthritis/Rheumatoid Brother   ? ? ?Social History  ? ?Socioeconomic History  ? Marital status: Married  ?  Spouse name: Not on file  ? Number of children: Not on file  ? Years of education: Not on file  ? Highest education level: Not on file  ?Occupational History  ? Not on file  ?Tobacco Use  ? Smoking status: Every Day  ?  Packs/day: 1.00  ?  Years: 30.00  ?  Pack years: 30.00  ?  Types: Cigarettes  ? Smokeless tobacco: Never  ?Vaping Use  ? Vaping Use: Never used  ?Substance and  Sexual Activity  ? Alcohol use: Yes  ?  Alcohol/week: 0.0 standard drinks  ?  Comment: occasionally  ? Drug use: No  ? Sexual activity: Not Currently  ?  Birth control/protection: None  ?Other Topics Concern  ? Not on file  ?Social History Narrative  ? Not on file  ? ?Social Determinants of Health  ? ?Financial Resource Strain: Not on file  ?Food Insecurity: Not on file  ?Transportation Needs: Not on file  ?Physical Activity: Not on file  ?Stress: Not on file  ?Social Connections: Not on file  ?Intimate Partner Violence: Not on file  ? ? ? ? ?Review of Systems  ?Constitutional:  Negative for chills, fatigue and unexpected weight change.  ?HENT:  Negative for congestion, rhinorrhea, sneezing and sore throat.   ?Eyes:  Negative for redness.   ?Respiratory:  Negative for cough, chest tightness and shortness of breath.   ?Cardiovascular:  Negative for chest pain and palpitations.  ?Gastrointestinal:  Negative for abdominal pain, constipation, diarrhea, nausea and vomiting.  ?Genitourinary:  Negative for dysuria and frequency.  ?Musculoskeletal:  Negative for arthralgias, back pain, joint swelling and neck pain.  ?Skin:  Negative for rash.  ?Neurological: Negative.  Negative for tremors and numbness.  ?Hematological:  Negative for adenopathy. Does not bruise/bleed easily.  ?Psychiatric/Behavioral:  Negative for behavioral problems (Depression), sleep disturbance and suicidal ideas. The patient is not nervous/anxious.   ? ?Vital Signs: ?BP 118/88   Pulse (!) 111   Temp 98.2 ?F (36.8 ?C)   Resp 16   Ht '5\' 3"'$  (1.6 m)   Wt 226 lb 3.2 oz (102.6 kg)   SpO2 97%   BMI 40.07 kg/m?  ? ? ?Physical Exam ?Vitals reviewed.  ?Constitutional:   ?   General: Tiffany Schaefer is not in acute distress. ?   Appearance: Normal appearance. Tiffany Schaefer is obese. Tiffany Schaefer is not ill-appearing.  ?HENT:  ?   Head: Normocephalic and atraumatic.  ?Eyes:  ?   Pupils: Pupils are equal, round, and reactive to light.  ?Cardiovascular:  ?   Rate and Rhythm: Normal rate and regular rhythm.  ?Pulmonary:  ?   Effort: Pulmonary effort is normal. No respiratory distress.  ?Musculoskeletal:  ?   Cervical back: Tenderness present.  ?Lymphadenopathy:  ?   Cervical: Cervical adenopathy present.  ?Neurological:  ?   Mental Status: Tiffany Schaefer is alert and oriented to person, place, and time.  ?Psychiatric:     ?   Mood and Affect: Mood normal.     ?   Behavior: Behavior normal.  ? ? ? ? ? ?Assessment/Plan: ?1. Parotid mass ?Urgent referral to ENT for 3 cm suspicious left parotid mass, biopsy recommended to rule out malignancy  ?- Ambulatory referral to ENT ? ?2. Primary hypertension ?Stable, refills ordered  ?- carvedilol (COREG) 25 MG tablet; Take 1 tablet (25 mg total) by mouth daily with lunch.  Dispense: 90 tablet;  Refill: 3 ? ?3. Mixed hyperlipidemia ?Continue as prescribed, refills ordered.  ?- lovastatin (MEVACOR) 40 MG tablet; Take 1 tablet (40 mg total) by mouth at bedtime.  Dispense: 90 tablet; Refill: 3 ? ?4. Hypokalemia ?Continue as prescribed, refills ordered.  ?- potassium chloride SA (KLOR-CON M) 20 MEQ tablet; Take 1 tablet (20 mEq total) by mouth daily.  Dispense: 90 tablet; Refill: 1 ? ? ?General Counseling: Tiffany Schaefer verbalizes understanding of the findings of todays visit and agrees with plan of treatment. I have discussed any further diagnostic evaluation that may be needed or ordered today.  We also reviewed her medications today. Tiffany Schaefer has been encouraged to call the office with any questions or concerns that should arise related to todays visit. ? ? ? ?Orders Placed This Encounter  ?Procedures  ? Ambulatory referral to ENT  ? ? ?Meds ordered this encounter  ?Medications  ? carvedilol (COREG) 25 MG tablet  ?  Sig: Take 1 tablet (25 mg total) by mouth daily with lunch.  ?  Dispense:  90 tablet  ?  Refill:  3  ? lovastatin (MEVACOR) 40 MG tablet  ?  Sig: Take 1 tablet (40 mg total) by mouth at bedtime.  ?  Dispense:  90 tablet  ?  Refill:  3  ? potassium chloride SA (KLOR-CON M) 20 MEQ tablet  ?  Sig: Take 1 tablet (20 mEq total) by mouth daily.  ?  Dispense:  90 tablet  ?  Refill:  1  ? ? ?Return in about 2 months (around 11/03/2021) for F/U, Laneice Meneely PCP. ? ? ?Total time spent:30 Minutes ?Time spent includes review of chart, medications, test results, and follow up plan with the patient.  ? ?[place] Controlled Substance Database was reviewed by me. ? ?This patient was seen by Jonetta Osgood, FNP-C in collaboration with Dr. Clayborn Bigness as a part of collaborative care agreement. ? ? ?Marimar Suber R. Valetta Fuller, MSN, FNP-C ?Internal medicine  ?

## 2021-09-04 ENCOUNTER — Telehealth: Payer: Self-pay

## 2021-09-04 NOTE — Telephone Encounter (Signed)
Awaiting 09/03/21 office notes for Otolaryngology referral-Glendene ?

## 2021-09-08 ENCOUNTER — Encounter: Payer: Self-pay | Admitting: Nurse Practitioner

## 2021-09-11 NOTE — Telephone Encounter (Signed)
Referral sent via [facility] to [facility]-Janine ?

## 2021-09-12 NOTE — Telephone Encounter (Signed)
Appointment scheduled for 09/27/21 @ 3:20-Rie ?

## 2021-09-27 DIAGNOSIS — D37030 Neoplasm of uncertain behavior of the parotid salivary glands: Secondary | ICD-10-CM | POA: Diagnosis not present

## 2021-09-28 ENCOUNTER — Other Ambulatory Visit: Payer: Self-pay | Admitting: Unknown Physician Specialty

## 2021-09-28 DIAGNOSIS — K118 Other diseases of salivary glands: Secondary | ICD-10-CM

## 2021-10-04 ENCOUNTER — Other Ambulatory Visit: Payer: Self-pay | Admitting: Interventional Radiology

## 2021-10-10 ENCOUNTER — Ambulatory Visit
Admission: RE | Admit: 2021-10-10 | Discharge: 2021-10-10 | Disposition: A | Discharge status: Home / Self Care | Payer: Medicare Other | Source: Ambulatory Visit | Attending: Unknown Physician Specialty | Admitting: Unknown Physician Specialty

## 2021-10-10 DIAGNOSIS — K118 Other diseases of salivary glands: Secondary | ICD-10-CM | POA: Insufficient documentation

## 2021-10-10 DIAGNOSIS — D110 Benign neoplasm of parotid gland: Secondary | ICD-10-CM | POA: Diagnosis not present

## 2021-10-30 DIAGNOSIS — D37030 Neoplasm of uncertain behavior of the parotid salivary glands: Secondary | ICD-10-CM | POA: Diagnosis not present

## 2021-11-05 ENCOUNTER — Ambulatory Visit (INDEPENDENT_AMBULATORY_CARE_PROVIDER_SITE_OTHER): Payer: Medicare Other | Admitting: Nurse Practitioner

## 2021-11-05 ENCOUNTER — Encounter: Payer: Self-pay | Admitting: Nurse Practitioner

## 2021-11-05 VITALS — BP 138/80 | HR 91 | Temp 98.4°F | Resp 16 | Ht 63.0 in | Wt 223.0 lb

## 2021-11-05 DIAGNOSIS — E876 Hypokalemia: Secondary | ICD-10-CM | POA: Diagnosis not present

## 2021-11-05 DIAGNOSIS — R609 Edema, unspecified: Secondary | ICD-10-CM | POA: Diagnosis not present

## 2021-11-05 DIAGNOSIS — M25511 Pain in right shoulder: Secondary | ICD-10-CM

## 2021-11-05 DIAGNOSIS — D119 Benign neoplasm of major salivary gland, unspecified: Secondary | ICD-10-CM | POA: Diagnosis not present

## 2021-11-05 DIAGNOSIS — I10 Essential (primary) hypertension: Secondary | ICD-10-CM

## 2021-11-05 MED ORDER — CYCLOBENZAPRINE HCL 10 MG PO TABS: 10.0000 mg | ORAL_TABLET | Freq: Every evening | ORAL | 0 refills | Status: DC | PRN

## 2021-11-05 MED ORDER — FUROSEMIDE 20 MG PO TABS: 20.0000 mg | ORAL_TABLET | Freq: Every day | ORAL | 1 refills | Status: DC

## 2021-11-05 MED ORDER — POTASSIUM CHLORIDE CRYS ER 20 MEQ PO TBCR: 20.0000 meq | EXTENDED_RELEASE_TABLET | Freq: Every day | ORAL | 1 refills | Status: DC

## 2021-11-05 MED ORDER — LOSARTAN POTASSIUM-HCTZ 100-25 MG PO TABS: ORAL_TABLET | ORAL | 1 refills | Status: DC

## 2021-11-05 NOTE — Progress Notes (Signed)
[facility] [place], [place] [address]  Internal MEDICINE  Office Visit Note  Patient Name: Tiffany Schaefer  [id number]  [id number]  Date of Service: 11/05/2021  Chief Complaint  Patient presents with   Follow-up   Hyperlipidemia   Hypertension    HPI [name] presents for follow-up visit for hypertension.  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  Blood pressure is elevated but improved when rechecked, see vitals.  Patient reports that she has not taken her morning medications including her blood pressure medications yet today because she prefers to take those medications with food and she has not eaten breakfast yet.  She also reports that her right shoulder started hurting last night before she went to bed and then it made it hard to sleep and she only got approximately 3 hours of sleep.  Range of motion of her right shoulder is slightly decreased, it is painful for her to lift her arm the pain radiates down to her elbow.  She denies any decreased in grip strength of her right hand and denies any pain in her right hand or right wrist. Right shoulder started hurting last night before bed, made if hard to sleep.  She has lost 3 pounds since her last office visit She had the nurse practitioner the insurance company do a home visit and they had told her she had COPD which she said she was not aware of because nobody within the office had told her this.  According to her records she does not have any prior diagnosis of COPD but on her most recent CT scan of the neck where we were looking at the parotid mass, there was a note of mild centrilobular emphysema seen in the upper part of the lungs on the CT scan.    Current Medication: Outpatient Encounter Medications as of 11/05/2021  Medication Sig   albuterol (VENTOLIN HFA) 108 (90 Base) MCG/ACT inhaler Inhale 2 puffs into the lungs every 6 (six) hours as needed for wheezing or shortness of  breath.   aspirin EC 81 MG tablet Take 81 mg by mouth daily with lunch.    bisacodyl (DULCOLAX) 5 MG EC tablet Take 5 mg by mouth daily as needed for moderate constipation.   carvedilol (COREG) 25 MG tablet Take 1 tablet (25 mg total) by mouth daily with lunch.   cyclobenzaprine (FLEXERIL) 10 MG tablet Take 1 tablet (10 mg total) by mouth at bedtime as needed for muscle spasms.   lovastatin (MEVACOR) 40 MG tablet Take 1 tablet (40 mg total) by mouth at bedtime.   triamcinolone cream (KENALOG) 0.5 % Apply topically 2 (two) times daily.   [DISCONTINUED] furosemide (LASIX) 20 MG tablet TAKE ONE TABLET BY MOUTH ONCE DAILY   [DISCONTINUED] losartan-hydrochlorothiazide (HYZAAR) 100-25 MG tablet TAKE ONE TABLET BY MOUTH ONCE DAILY WITH LUNCH   [DISCONTINUED] potassium chloride SA (KLOR-CON M) 20 MEQ tablet Take 1 tablet (20 mEq total) by mouth daily.   furosemide (LASIX) 20 MG tablet Take 1 tablet (20 mg total) by mouth daily.   losartan-hydrochlorothiazide (HYZAAR) 100-25 MG tablet TAKE ONE TABLET BY MOUTH ONCE DAILY WITH LUNCH   potassium chloride SA (KLOR-CON M) 20 MEQ tablet Take 1 tablet (20 mEq total) by mouth daily.   No facility-administered encounter medications on file as of 11/05/2021.    Surgical History: Past Surgical History:  Procedure Laterality Date   CESAREAN SECTION     denies  09/03/17   COLONOSCOPY     HEMORRHOID SURGERY     HYSTEROSCOPY WITH D & C N/A 09/11/2017   Procedure: DILATATION AND CURETTAGE /HYSTEROSCOPY;  Surgeon: Malachy Mood, MD;  Location: [facility] ORS;  Service: Gynecology;  Laterality: N/A;    Medical History: Past Medical History:  Diagnosis Date   Arthritis    Hyperlipidemia    Hypertension    Sinus infection    Sleep apnea     Family History: Family History  Problem Relation Age of Onset   Stroke Mother    Diabetes Brother    Rheumatic fever Brother    Arthritis/Rheumatoid Brother     Social History   Socioeconomic History   Marital status:  Married    Spouse name: Not on file   Number of children: Not on file   Years of education: Not on file   Highest education level: Not on file  Occupational History   Not on file  Tobacco Use   Smoking status: Every Day    Packs/day: 1.00    Years: 30.00    Total pack years: 30.00    Types: Cigarettes   Smokeless tobacco: Never  Vaping Use   Vaping Use: Never used  Substance and Sexual Activity   Alcohol use: Yes    Alcohol/week: 0.0 standard drinks of alcohol    Comment: occasionally   Drug use: No   Sexual activity: Not Currently    Birth control/protection: None  Other Topics Concern   Not on file  Social History Narrative   Not on file   Social Determinants of Health   Financial Resource Strain: Low Risk  (08/01/2020)   Overall Financial Resource Strain (CARDIA)    Difficulty of Paying Living Expenses: Not hard at all  Food Insecurity: Not on file  Transportation Needs: Not on file  Physical Activity: Not on file  Stress: Not on file  Social Connections: Not on file  Intimate Partner Violence: Not on file      Review of Systems  Constitutional:  Negative for chills, fatigue and unexpected weight change.  HENT:  Negative for congestion, rhinorrhea, sneezing and sore throat.   Eyes:  Negative for redness.  Respiratory:  Negative for cough, chest tightness and shortness of breath.   Cardiovascular:  Negative for chest pain and palpitations.  Gastrointestinal:  Negative for abdominal pain, constipation, diarrhea, nausea and vomiting.  Genitourinary:  Negative for dysuria and frequency.  Musculoskeletal:  Positive for arthralgias (right shoulder). Negative for back pain, joint swelling and neck pain.  Skin:  Negative for rash.  Neurological: Negative.  Negative for tremors and numbness.  Hematological:  Negative for adenopathy. Does not bruise/bleed easily.  Psychiatric/Behavioral:  Negative for behavioral problems (Depression), sleep disturbance and suicidal  ideas. The patient is not nervous/anxious.     Vital Signs: BP 138/80 Comment: 152/83  Pulse 91   Temp 98.4 F (36.9 C)   Resp 16   Ht '5\' 3"'$  (1.6 m)   Wt 223 lb (101.2 kg)   SpO2 99%   BMI 39.50 kg/m    Physical Exam Vitals reviewed.  Constitutional:      General: She is not in acute distress.    Appearance: Normal appearance. She is obese. She is not ill-appearing.  HENT:     Head: Normocephalic and atraumatic.  Eyes:     Pupils: Pupils are equal, round, and reactive to light.  Cardiovascular:     Rate and Rhythm: Normal rate and regular rhythm.  Pulmonary:     Effort: Pulmonary effort is normal. No respiratory distress.  Musculoskeletal:     Right shoulder: Tenderness present. Decreased range of motion.     Left shoulder: Normal.     Right upper arm: Normal.     Left upper arm: Normal.     Right elbow: Tenderness present.     Left elbow: Normal.     Cervical back: No tenderness.  Lymphadenopathy:     Cervical: No cervical adenopathy.  Neurological:     Mental Status: She is alert and oriented to person, place, and time.  Psychiatric:        Mood and Affect: Mood normal.        Behavior: Behavior normal.        Assessment/Plan: 1. Primary hypertension Blood pressure controlled with current medications, she has not taken her medications this morning and will do so when she eats breakfast after she leaves the office. - losartan-hydrochlorothiazide (HYZAAR) 100-25 MG tablet; TAKE ONE TABLET BY MOUTH ONCE DAILY WITH LUNCH  Dispense: 90 tablet; Refill: 1 - furosemide (LASIX) 20 MG tablet; Take 1 tablet (20 mg total) by mouth daily.  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  3. Acute pain of right shoulder Patient plans to try over-the-counter medication and alternate ibuprofen and Tylenol.  She did agree to a prescription of cyclobenzaprine for her to take at night as needed.  Patient is going out  of town this weekend but will call by Thursday or Friday if the pain is still persistent and ibuprofen and Tylenol are not alleviating the pain - cyclobenzaprine (FLEXERIL) 10 MG tablet; Take 1 tablet (10 mg total) by mouth at bedtime as needed for muscle spasms.  Dispense: 30 tablet; Refill: 0  4. Hypokalemia Stable, refills ordered - potassium chloride SA (KLOR-CON M) 20 MEQ tablet; Take 1 tablet (20 mEq total) by mouth daily.  Dispense: 90 tablet; Refill: 1  5. Peripheral edema Stable, refills ordered - furosemide (LASIX) 20 MG tablet; Take 1 tablet (20 mg total) by mouth daily.  Dispense: 90 tablet; Refill: 1   General Counseling: Zaylin verbalizes understanding of the findings of todays visit and agrees with plan of treatment. I have discussed any further diagnostic evaluation that may be needed or ordered today. We also reviewed her medications today. she has been encouraged to call the office with any questions or concerns that should arise related to todays visit.    No orders of the defined types were placed in this encounter.   Meds ordered this encounter  Medications   losartan-hydrochlorothiazide (HYZAAR) 100-25 MG tablet    Sig: TAKE ONE TABLET BY MOUTH ONCE DAILY WITH LUNCH    Dispense:  90 tablet    Refill:  1   potassium chloride SA (KLOR-CON M) 20 MEQ tablet    Sig: Take 1 tablet (20 mEq total) by mouth daily.    Dispense:  90 tablet    Refill:  1   furosemide (LASIX) 20 MG tablet    Sig: Take 1 tablet (20 mg total) by mouth daily.    Dispense:  90 tablet    Refill:  1   cyclobenzaprine (FLEXERIL) 10 MG tablet    Sig: Take 1 tablet (10 mg total) by mouth at bedtime as needed for muscle spasms.    Dispense:  30 tablet    Refill:  0    Fill today please.  Return in about 3 months (around 02/05/2022) for F/U, Delphia Kaylor PCP.   Total time spent:30 Minutes Time spent includes review of chart, medications, test results, and follow up plan with the patient.   [place]  Controlled Substance Database was reviewed by me.  This patient was seen by Jonetta Osgood, FNP-C in collaboration with Dr. Clayborn Bigness as a part of collaborative care agreement.   Artasia Thang R. Valetta Fuller, MSN, FNP-C Internal medicine

## 2022-01-02 DIAGNOSIS — Z1231 Encounter for screening mammogram for malignant neoplasm of breast: Secondary | ICD-10-CM | POA: Diagnosis not present

## 2022-01-30 LAB — SURGICAL PATHOLOGY

## 2022-02-04 ENCOUNTER — Ambulatory Visit (INDEPENDENT_AMBULATORY_CARE_PROVIDER_SITE_OTHER): Payer: Medicare Other | Admitting: Nurse Practitioner

## 2022-02-04 ENCOUNTER — Encounter: Payer: Self-pay | Admitting: Nurse Practitioner

## 2022-02-04 VITALS — BP 117/66 | HR 93 | Temp 95.3°F | Resp 16 | Ht 63.0 in | Wt 220.6 lb

## 2022-02-04 DIAGNOSIS — Z23 Encounter for immunization: Secondary | ICD-10-CM | POA: Diagnosis not present

## 2022-02-04 DIAGNOSIS — I10 Essential (primary) hypertension: Secondary | ICD-10-CM | POA: Diagnosis not present

## 2022-02-04 DIAGNOSIS — E876 Hypokalemia: Secondary | ICD-10-CM

## 2022-02-04 MED ORDER — LOSARTAN POTASSIUM-HCTZ 100-25 MG PO TABS: ORAL_TABLET | ORAL | 1 refills | Status: DC

## 2022-02-04 MED ORDER — POTASSIUM CHLORIDE CRYS ER 20 MEQ PO TBCR: 20.0000 meq | EXTENDED_RELEASE_TABLET | Freq: Every day | ORAL | 1 refills | Status: DC

## 2022-02-04 MED ORDER — FUROSEMIDE 20 MG PO TABS: 20.0000 mg | ORAL_TABLET | Freq: Every day | ORAL | 1 refills | Status: DC

## 2022-02-04 NOTE — Progress Notes (Signed)
[facility] [place], [place] [address]  Internal MEDICINE  Office Visit Note  Patient Name: Tiffany Schaefer  [id number]  [id number]  Date of Service: 02/04/2022  Chief Complaint  Patient presents with   Hyperlipidemia   Hypertension   Follow-up    HPI Sharetta presents for a follow-up visit for hypertension, hyperlipidemia, and hypokalemia. Hypertension --BP well controlled with current medications, lost 3 pounds since last visit Hyperlipidemia --taking lovastatin 40 mg daily, will repeat lipid panel in November Hypokalemia --taking prescription potassium supplement, is also on to diuretics that deplete potassium Requests flu shot    Current Medication: Outpatient Encounter Medications as of 02/04/2022  Medication Sig   albuterol (VENTOLIN HFA) 108 (90 Base) MCG/ACT inhaler Inhale 2 puffs into the lungs every 6 (six) hours as needed for wheezing or shortness of breath.   aspirin EC 81 MG tablet Take 81 mg by mouth daily with lunch.    bisacodyl (DULCOLAX) 5 MG EC tablet Take 5 mg by mouth daily as needed for moderate constipation.   carvedilol (COREG) 25 MG tablet Take 1 tablet (25 mg total) by mouth daily with lunch.   cyclobenzaprine (FLEXERIL) 10 MG tablet Take 1 tablet (10 mg total) by mouth at bedtime as needed for muscle spasms.   lovastatin (MEVACOR) 40 MG tablet Take 1 tablet (40 mg total) by mouth at bedtime.   triamcinolone cream (KENALOG) 0.5 % Apply topically 2 (two) times daily.   [DISCONTINUED] furosemide (LASIX) 20 MG tablet Take 1 tablet (20 mg total) by mouth daily.   [DISCONTINUED] losartan-hydrochlorothiazide (HYZAAR) 100-25 MG tablet TAKE ONE TABLET BY MOUTH ONCE DAILY WITH LUNCH   [DISCONTINUED] potassium chloride SA (KLOR-CON M) 20 MEQ tablet Take 1 tablet (20 mEq total) by mouth daily.   furosemide (LASIX) 20 MG tablet Take 1 tablet (20 mg total) by mouth daily.   losartan-hydrochlorothiazide (HYZAAR) 100-25 MG tablet TAKE ONE TABLET BY  MOUTH ONCE DAILY WITH LUNCH   potassium chloride SA (KLOR-CON M) 20 MEQ tablet Take 1 tablet (20 mEq total) by mouth daily.   No facility-administered encounter medications on file as of 02/04/2022.    Surgical History: Past Surgical History:  Procedure Laterality Date   CESAREAN SECTION     denies 09/03/17   COLONOSCOPY     HEMORRHOID SURGERY     HYSTEROSCOPY WITH D & C N/A 09/11/2017   Procedure: DILATATION AND CURETTAGE /HYSTEROSCOPY;  Surgeon: Malachy Mood, MD;  Location: [facility] ORS;  Service: Gynecology;  Laterality: N/A;    Medical History: Past Medical History:  Diagnosis Date   Arthritis    Hyperlipidemia    Hypertension    Sinus infection    Sleep apnea     Family History: Family History  Problem Relation Age of Onset   Stroke Mother    Diabetes Brother    Rheumatic fever Brother    Arthritis/Rheumatoid Brother     Social History   Socioeconomic History   Marital status: Married    Spouse name: Not on file   Number of children: Not on file   Years of education: Not on file   Highest education level: Not on file  Occupational History   Not on file  Tobacco Use   Smoking status: Every Day    Packs/day: 1.00    Years: 30.00    Total pack years: 30.00    Types: Cigarettes   Smokeless tobacco: Never  Vaping Use   Vaping Use: Never used  Substance  and Sexual Activity   Alcohol use: Yes    Alcohol/week: 0.0 standard drinks of alcohol    Comment: occasionally   Drug use: No   Sexual activity: Not Currently    Birth control/protection: None  Other Topics Concern   Not on file  Social History Narrative   Not on file   Social Determinants of Health   Financial Resource Strain: Low Risk  (08/01/2020)   Overall Financial Resource Strain (CARDIA)    Difficulty of Paying Living Expenses: Not hard at all  Food Insecurity: Not on file  Transportation Needs: Not on file  Physical Activity: Not on file  Stress: Not on file  Social Connections: Not on  file  Intimate Partner Violence: Not on file      Review of Systems  Constitutional: Negative.  Negative for chills, fatigue and unexpected weight change.  HENT:  Negative for congestion, rhinorrhea, sneezing and sore throat.   Eyes:  Negative for redness.  Respiratory: Negative.  Negative for cough, chest tightness, shortness of breath and wheezing.   Cardiovascular: Negative.  Negative for chest pain and palpitations.  Gastrointestinal:  Negative for abdominal pain, constipation, diarrhea, nausea and vomiting.  Genitourinary:  Negative for dysuria and frequency.  Musculoskeletal:  Negative for arthralgias (right shoulder resolved), back pain, joint swelling and neck pain.  Skin:  Negative for rash.  Neurological: Negative.  Negative for tremors and numbness.  Hematological:  Negative for adenopathy. Does not bruise/bleed easily.  Psychiatric/Behavioral:  Negative for behavioral problems (Depression), sleep disturbance and suicidal ideas. The patient is not nervous/anxious.     Vital Signs: BP 117/66   Pulse 93   Temp (!) 95.3 F (35.2 C)   Resp 16   Ht '5\' 3"'$  (1.6 m)   Wt 220 lb 9.6 oz (100.1 kg)   SpO2 100%   BMI 39.08 kg/m    Physical Exam Vitals reviewed.  Constitutional:      General: She is not in acute distress.    Appearance: Normal appearance. She is obese. She is not ill-appearing.  HENT:     Head: Normocephalic and atraumatic.  Eyes:     Pupils: Pupils are equal, round, and reactive to light.  Cardiovascular:     Rate and Rhythm: Normal rate and regular rhythm.  Pulmonary:     Effort: Pulmonary effort is normal. No respiratory distress.  Musculoskeletal:     Cervical back: No tenderness.  Lymphadenopathy:     Cervical: No cervical adenopathy.  Neurological:     Mental Status: She is alert and oriented to person, place, and time.  Psychiatric:        Mood and Affect: Mood normal.        Behavior: Behavior normal.        Assessment/Plan: 1.  Primary hypertension Continue medications as prescribed.  - furosemide (LASIX) 20 MG tablet; Take 1 tablet (20 mg total) by mouth daily.  Dispense: 90 tablet; Refill: 1 - losartan-hydrochlorothiazide (HYZAAR) 100-25 MG tablet; TAKE ONE TABLET BY MOUTH ONCE DAILY WITH LUNCH  Dispense: 90 tablet; Refill: 1 - potassium chloride SA (KLOR-CON M) 20 MEQ tablet; Take 1 tablet (20 mEq total) by mouth daily.  Dispense: 90 tablet; Refill: 1  2. Hypokalemia Continue potassium supplement as prescribed - potassium chloride SA (KLOR-CON M) 20 MEQ tablet; Take 1 tablet (20 mEq total) by mouth daily.  Dispense: 90 tablet; Refill: 1  3. Needs flu shot Administered in office today - Flu Vaccine MDCK QUAD PF  General Counseling: Mahlet verbalizes understanding of the findings of todays visit and agrees with plan of treatment. I have discussed any further diagnostic evaluation that may be needed or ordered today. We also reviewed her medications today. she has been encouraged to call the office with any questions or concerns that should arise related to todays visit.    No orders of the defined types were placed in this encounter.   Meds ordered this encounter  Medications   furosemide (LASIX) 20 MG tablet    Sig: Take 1 tablet (20 mg total) by mouth daily.    Dispense:  90 tablet    Refill:  1    For future refills   losartan-hydrochlorothiazide (HYZAAR) 100-25 MG tablet    Sig: TAKE ONE TABLET BY MOUTH ONCE DAILY WITH LUNCH    Dispense:  90 tablet    Refill:  1    For future refills   potassium chloride SA (KLOR-CON M) 20 MEQ tablet    Sig: Take 1 tablet (20 mEq total) by mouth daily.    Dispense:  90 tablet    Refill:  1    For future refills    Return in 5 weeks (on 03/11/2022) for previously scheduled, CPE, Albertha Beattie PCP.   Total time spent:30 Minutes Time spent includes review of chart, medications, test results, and follow up plan with the patient.   [place] Controlled Substance Database was  reviewed by me.  This patient was seen by Jonetta Osgood, FNP-C in collaboration with Dr. Clayborn Bigness as a part of collaborative care agreement.   Concepcion Kirkpatrick R. Valetta Fuller, MSN, FNP-C Internal medicine

## 2022-02-28 DIAGNOSIS — D37030 Neoplasm of uncertain behavior of the parotid salivary glands: Secondary | ICD-10-CM | POA: Diagnosis not present

## 2022-02-28 DIAGNOSIS — H903 Sensorineural hearing loss, bilateral: Secondary | ICD-10-CM | POA: Diagnosis not present

## 2022-03-11 ENCOUNTER — Encounter: Payer: Self-pay | Admitting: Nurse Practitioner

## 2022-03-11 ENCOUNTER — Ambulatory Visit (INDEPENDENT_AMBULATORY_CARE_PROVIDER_SITE_OTHER): Payer: Medicare Other | Admitting: Nurse Practitioner

## 2022-03-11 VITALS — BP 130/73 | HR 98 | Temp 97.2°F | Resp 16 | Ht 63.0 in | Wt 218.0 lb

## 2022-03-11 DIAGNOSIS — E559 Vitamin D deficiency, unspecified: Secondary | ICD-10-CM

## 2022-03-11 DIAGNOSIS — I10 Essential (primary) hypertension: Secondary | ICD-10-CM

## 2022-03-11 DIAGNOSIS — Z0001 Encounter for general adult medical examination with abnormal findings: Secondary | ICD-10-CM | POA: Diagnosis not present

## 2022-03-11 DIAGNOSIS — E782 Mixed hyperlipidemia: Secondary | ICD-10-CM

## 2022-03-11 NOTE — Progress Notes (Signed)
[facility] [place], [place] [address]  Internal MEDICINE  Office Visit Note  Patient Name: Tiffany Schaefer  [id number]  [id number]  Date of Service: 03/11/2022  Chief Complaint  Patient presents with   Medicare Wellness   Hypertension   Hyperlipidemia    HPI Enas presents for an annual well visit and physical exam.  Well-appearing 71 year old female with hypertension, atherosclerosis, and high cholesterol.  --mammogram done in August at [facility] --cologuard done in July through insurance home visit -- reports was negative.  --due for routine labs, did not get labs drawn last year.  -- interested in shingles vaccine --does not need any refills --no new or worsening pain, no other concerns.       Current Medication: Outpatient Encounter Medications as of 03/11/2022  Medication Sig   albuterol (VENTOLIN HFA) 108 (90 Base) MCG/ACT inhaler Inhale 2 puffs into the lungs every 6 (six) hours as needed for wheezing or shortness of breath.   aspirin EC 81 MG tablet Take 81 mg by mouth daily with lunch.    bisacodyl (DULCOLAX) 5 MG EC tablet Take 5 mg by mouth daily as needed for moderate constipation.   carvedilol (COREG) 25 MG tablet Take 1 tablet (25 mg total) by mouth daily with lunch.   cyclobenzaprine (FLEXERIL) 10 MG tablet Take 1 tablet (10 mg total) by mouth at bedtime as needed for muscle spasms.   furosemide (LASIX) 20 MG tablet Take 1 tablet (20 mg total) by mouth daily.   losartan-hydrochlorothiazide (HYZAAR) 100-25 MG tablet TAKE ONE TABLET BY MOUTH ONCE DAILY WITH LUNCH   lovastatin (MEVACOR) 40 MG tablet Take 1 tablet (40 mg total) by mouth at bedtime.   potassium chloride SA (KLOR-CON M) 20 MEQ tablet Take 1 tablet (20 mEq total) by mouth daily.   triamcinolone cream (KENALOG) 0.5 % Apply topically 2 (two) times daily.   No facility-administered encounter medications on file as of 03/11/2022.    Surgical History: Past Surgical History:  Procedure  Laterality Date   CESAREAN SECTION     denies 09/03/17   COLONOSCOPY     HEMORRHOID SURGERY     HYSTEROSCOPY WITH D & C N/A 09/11/2017   Procedure: DILATATION AND CURETTAGE /HYSTEROSCOPY;  Surgeon: Malachy Mood, MD;  Location: [facility] ORS;  Service: Gynecology;  Laterality: N/A;    Medical History: Past Medical History:  Diagnosis Date   Arthritis    Hyperlipidemia    Hypertension    Sinus infection    Sleep apnea     Family History: Family History  Problem Relation Age of Onset   Stroke Mother    Diabetes Brother    Rheumatic fever Brother    Arthritis/Rheumatoid Brother     Social History   Socioeconomic History   Marital status: Married    Spouse name: Not on file   Number of children: Not on file   Years of education: Not on file   Highest education level: Not on file  Occupational History   Not on file  Tobacco Use   Smoking status: Every Day    Packs/day: 1.00    Years: 30.00    Total pack years: 30.00    Types: Cigarettes   Smokeless tobacco: Never  Vaping Use   Vaping Use: Never used  Substance and Sexual Activity   Alcohol use: Yes    Alcohol/week: 0.0 standard drinks of alcohol    Comment: occasionally   Drug use: No   Sexual  activity: Not Currently    Birth control/protection: None  Other Topics Concern   Not on file  Social History Narrative   Not on file   Social Determinants of Health   Financial Resource Strain: Low Risk  (08/01/2020)   Overall Financial Resource Strain (CARDIA)    Difficulty of Paying Living Expenses: Not hard at all  Food Insecurity: Not on file  Transportation Needs: Not on file  Physical Activity: Not on file  Stress: Not on file  Social Connections: Not on file  Intimate Partner Violence: Not on file      Review of Systems  Constitutional:  Negative for activity change, appetite change, chills, fatigue, fever and unexpected weight change.  HENT:  Negative for congestion, ear pain, rhinorrhea, sneezing, sore  throat and trouble swallowing.   Eyes: Negative.  Negative for redness.  Respiratory: Negative.  Negative for cough, chest tightness, shortness of breath and wheezing.   Cardiovascular: Negative.  Negative for chest pain and palpitations.  Gastrointestinal: Negative.  Negative for abdominal pain, blood in stool, constipation, diarrhea, nausea and vomiting.  Endocrine: Negative.   Genitourinary: Negative.  Negative for difficulty urinating, dysuria, frequency, hematuria and urgency.  Musculoskeletal: Negative.  Negative for arthralgias, back pain, joint swelling, myalgias and neck pain.  Skin: Negative.  Negative for rash and wound.  Allergic/Immunologic: Negative.  Negative for immunocompromised state.  Neurological: Negative.  Negative for dizziness, tremors, seizures, numbness and headaches.  Hematological: Negative.  Negative for adenopathy. Does not bruise/bleed easily.  Psychiatric/Behavioral: Negative.  Negative for behavioral problems (Depression), self-injury, sleep disturbance and suicidal ideas. The patient is not nervous/anxious.     Vital Signs: BP 130/73   Pulse 98   Temp (!) 97.2 F (36.2 C)   Resp 16   Ht _0  (1.6 m)   Wt 218 lb (98.9 kg)   SpO2 99%   BMI 38.62 kg/m    Physical Exam Vitals reviewed.  Constitutional:      General: She is awake. She is not in acute distress.    Appearance: Normal appearance. She is well-developed and well-groomed. She is obese. She is not ill-appearing or diaphoretic.  HENT:     Head: Normocephalic and atraumatic.     Right Ear: Tympanic membrane, ear canal and external ear normal. There is no impacted cerumen.     Left Ear: Tympanic membrane, ear canal and external ear normal. There is no impacted cerumen.     Nose: Nose normal. No congestion or rhinorrhea.     Mouth/Throat:     Lips: Pink.     Mouth: Mucous membranes are moist.     Pharynx: Oropharynx is clear. Uvula midline. No oropharyngeal exudate or posterior  oropharyngeal erythema.  Eyes:     General: Lids are normal. Vision grossly intact. Gaze aligned appropriately. No scleral icterus.       Right eye: No discharge.        Left eye: No discharge.     Extraocular Movements: Extraocular movements intact.     Conjunctiva/sclera: Conjunctivae normal.     Pupils: Pupils are equal, round, and reactive to light.     Funduscopic exam:    Right eye: Red reflex present.        Left eye: Red reflex present. Neck:     Thyroid: No thyromegaly.     Vascular: No JVD.     Trachea: No tracheal deviation.  Cardiovascular:     Rate and Rhythm: Normal rate and regular rhythm.  Heart sounds: Normal heart sounds, S1 normal and S2 normal. No murmur heard.    No friction rub. No gallop.  Pulmonary:     Effort: Pulmonary effort is normal. No accessory muscle usage or respiratory distress.     Breath sounds: Normal breath sounds and air entry. No stridor. No wheezing or rales.  Chest:     Chest wall: No tenderness.     Comments: Declined clinical breast exams, gets annual mammograms.  Abdominal:     General: Bowel sounds are normal. There is no distension.     Palpations: Abdomen is soft. There is no shifting dullness, fluid wave, mass or pulsatile mass.     Tenderness: There is no abdominal tenderness. There is no guarding or rebound.  Musculoskeletal:        General: No tenderness or deformity. Normal range of motion.     Cervical back: Normal range of motion and neck supple.     Right lower leg: No edema.     Left lower leg: No edema.  Lymphadenopathy:     Cervical: No cervical adenopathy.  Skin:    General: Skin is warm and dry.     Capillary Refill: Capillary refill takes less than 2 seconds.     Coloration: Skin is not pale.     Findings: No erythema or rash.  Neurological:     Mental Status: She is alert and oriented to person, place, and time.     Cranial Nerves: No cranial nerve deficit.     Motor: No abnormal muscle tone.      Coordination: Coordination normal.     Gait: Gait normal.     Deep Tendon Reflexes: Reflexes are normal and symmetric.  Psychiatric:        Mood and Affect: Mood and affect normal.        Behavior: Behavior normal. Behavior is cooperative.        Thought Content: Thought content normal.        Judgment: Judgment normal.        Assessment/Plan: 1. Encounter for general adult medical examination with abnormal findings Age-appropriate preventive screenings and vaccinations discussed, annual physical exam completed. Routine labs for health maintenance ordered see below. PHM updated.  - CBC with Differential/Platelet - Lipid Profile - CMP14+EGFR  2. Primary hypertension Routine labs ordered, BP stable, continue medications as prescribed. No changes, no refills needed - CBC with Differential/Platelet - Lipid Profile - CMP14+EGFR  3. Mixed hyperlipidemia Routine labs ordered - CBC with Differential/Platelet - Lipid Profile - CMP14+EGFR  4. Vitamin D deficiency Routine labs ordered - CBC with Differential/Platelet - Lipid Profile - CMP14+EGFR - Vitamin D (25 hydroxy)      General Counseling: Derra verbalizes understanding of the findings of todays visit and agrees with plan of treatment. I have discussed any further diagnostic evaluation that may be needed or ordered today. We also reviewed her medications today. she has been encouraged to call the office with any questions or concerns that should arise related to todays visit.    Orders Placed This Encounter  Procedures   CBC with Differential/Platelet   Lipid Profile   CMP14+EGFR   Vitamin D (25 hydroxy)    No orders of the defined types were placed in this encounter.   Return in about 6 months (around 09/09/2022) for F/U, Dimitrius Steedman PCP and will call patient with lab results. .   Total time spent:30 Minutes Time spent includes review of chart, medications, test results, and follow up  plan with the patient.   [place]  Controlled Substance Database was reviewed by me.  This patient was seen by Jonetta Osgood, FNP-C in collaboration with Dr. Clayborn Bigness as a part of collaborative care agreement.  Maleia Weems R. Valetta Fuller, MSN, FNP-C Internal medicine

## 2022-03-18 DIAGNOSIS — Z0001 Encounter for general adult medical examination with abnormal findings: Secondary | ICD-10-CM | POA: Diagnosis not present

## 2022-03-18 DIAGNOSIS — E782 Mixed hyperlipidemia: Secondary | ICD-10-CM | POA: Diagnosis not present

## 2022-03-18 DIAGNOSIS — I10 Essential (primary) hypertension: Secondary | ICD-10-CM | POA: Diagnosis not present

## 2022-03-18 DIAGNOSIS — E559 Vitamin D deficiency, unspecified: Secondary | ICD-10-CM | POA: Diagnosis not present

## 2022-03-19 LAB — CBC WITH DIFFERENTIAL/PLATELET
Basophils Absolute: 0 10*3/uL (ref 0.0–0.2)
Basos: 1 %
EOS (ABSOLUTE): 0.1 10*3/uL (ref 0.0–0.4)
Eos: 2 %
Hematocrit: 37.1 % (ref 34.0–46.6)
Hemoglobin: 11.8 g/dL (ref 11.1–15.9)
Immature Grans (Abs): 0 10*3/uL (ref 0.0–0.1)
Immature Granulocytes: 0 %
Lymphocytes Absolute: 2.3 10*3/uL (ref 0.7–3.1)
Lymphs: 41 %
MCH: 28.3 pg (ref 26.6–33.0)
MCHC: 31.8 g/dL (ref 31.5–35.7)
MCV: 89 fL (ref 79–97)
Monocytes Absolute: 0.7 10*3/uL (ref 0.1–0.9)
Monocytes: 13 %
Neutrophils Absolute: 2.5 10*3/uL (ref 1.4–7.0)
Neutrophils: 43 %
Platelets: 199 10*3/uL (ref 150–450)
RBC: 4.17 x10E6/uL (ref 3.77–5.28)
RDW: 15.6 % — ABNORMAL HIGH (ref 11.7–15.4)
WBC: 5.5 10*3/uL (ref 3.4–10.8)

## 2022-03-19 LAB — CMP14+EGFR
ALT: 13 IU/L (ref 0–32)
AST: 26 IU/L (ref 0–40)
Albumin/Globulin Ratio: 0.6 — ABNORMAL LOW (ref 1.2–2.2)
Albumin: 3.3 g/dL — ABNORMAL LOW (ref 3.9–4.9)
Alkaline Phosphatase: 47 IU/L (ref 44–121)
BUN/Creatinine Ratio: 22 (ref 12–28)
BUN: 20 mg/dL (ref 8–27)
Bilirubin Total: 0.3 mg/dL (ref 0.0–1.2)
CO2: 23 mmol/L (ref 20–29)
Calcium: 8.8 mg/dL (ref 8.7–10.3)
Chloride: 100 mmol/L (ref 96–106)
Creatinine, Ser: 0.93 mg/dL (ref 0.57–1.00)
Globulin, Total: 5.5 g/dL — ABNORMAL HIGH (ref 1.5–4.5)
Glucose: 86 mg/dL (ref 70–99)
Potassium: 3.6 mmol/L (ref 3.5–5.2)
Sodium: 139 mmol/L (ref 134–144)
Total Protein: 8.8 g/dL — ABNORMAL HIGH (ref 6.0–8.5)
eGFR: 67 mL/min/{1.73_m2} (ref >59)

## 2022-03-19 LAB — LIPID PANEL
Chol/HDL Ratio: 5 ratio — ABNORMAL HIGH (ref 0.0–4.4)
Cholesterol, Total: 169 mg/dL (ref 100–199)
HDL: 34 mg/dL — ABNORMAL LOW (ref >39)
LDL Chol Calc (NIH): 114 mg/dL — ABNORMAL HIGH (ref 0–99)
Triglycerides: 112 mg/dL (ref 0–149)
VLDL Cholesterol Cal: 21 mg/dL (ref 5–40)

## 2022-03-19 LAB — VITAMIN D 25 HYDROXY (VIT D DEFICIENCY, FRACTURES): Vit D, 25-Hydroxy: 15.6 ng/mL — ABNORMAL LOW (ref 30.0–100.0)

## 2022-04-19 ENCOUNTER — Telehealth: Payer: Self-pay | Admitting: Pharmacist

## 2022-04-19 NOTE — Progress Notes (Signed)
[place] Team  Medication Adherence Quality Measures  04/19/22  Tiffany Schaefer [birth year]  Provider:  Jonetta Osgood, NP  Practice:  [facility]  Medication Adherence Measure(s):   Medication Adherence for Cholesterol Medication  Medication Adherence Findings:  Per review of pharmacy claims, [name] is late to fill lovastatin 40 mg as of 03/01/2022. Placed telephonic outreach to patient, HIPAA identifiers were confirmed. Patient reports that she does not need a refill on Lovastatin at this time, reports she has a whole bottle on hand.  Patient inquires about last lab results, states she has not received a letter or phone call of lab results. Will route note to office.   Loretha Brasil, PharmD [place] Pharmacist Office: [phone]

## 2022-08-06 ENCOUNTER — Other Ambulatory Visit: Payer: Self-pay | Admitting: Nurse Practitioner

## 2022-08-06 DIAGNOSIS — I10 Essential (primary) hypertension: Secondary | ICD-10-CM

## 2022-08-07 ENCOUNTER — Other Ambulatory Visit: Payer: Self-pay | Admitting: Nurse Practitioner

## 2022-08-07 DIAGNOSIS — I10 Essential (primary) hypertension: Secondary | ICD-10-CM

## 2022-08-07 DIAGNOSIS — E876 Hypokalemia: Secondary | ICD-10-CM

## 2022-08-27 ENCOUNTER — Other Ambulatory Visit: Payer: Self-pay | Admitting: Nurse Practitioner

## 2022-08-27 DIAGNOSIS — I10 Essential (primary) hypertension: Secondary | ICD-10-CM

## 2022-08-29 DIAGNOSIS — D110 Benign neoplasm of parotid gland: Secondary | ICD-10-CM | POA: Diagnosis not present

## 2022-09-09 ENCOUNTER — Other Ambulatory Visit: Payer: Self-pay | Admitting: Nurse Practitioner

## 2022-09-09 ENCOUNTER — Encounter: Payer: Self-pay | Admitting: Nurse Practitioner

## 2022-09-09 ENCOUNTER — Ambulatory Visit (INDEPENDENT_AMBULATORY_CARE_PROVIDER_SITE_OTHER): Payer: Medicare Other | Admitting: Nurse Practitioner

## 2022-09-09 VITALS — BP 130/68 | HR 81 | Temp 97.3°F | Resp 16 | Ht 63.0 in | Wt 202.0 lb

## 2022-09-09 DIAGNOSIS — E559 Vitamin D deficiency, unspecified: Secondary | ICD-10-CM | POA: Diagnosis not present

## 2022-09-09 DIAGNOSIS — R634 Abnormal weight loss: Secondary | ICD-10-CM

## 2022-09-09 DIAGNOSIS — E876 Hypokalemia: Secondary | ICD-10-CM

## 2022-09-09 DIAGNOSIS — L659 Nonscarring hair loss, unspecified: Secondary | ICD-10-CM

## 2022-09-09 DIAGNOSIS — E782 Mixed hyperlipidemia: Secondary | ICD-10-CM | POA: Diagnosis not present

## 2022-09-09 NOTE — Progress Notes (Signed)
[facility] [address] [place], [place] [address]  Internal MEDICINE  Office Visit Note  Patient Name: Tiffany Schaefer  [id number]  [id number]  Date of Service: 09/09/2022  Chief Complaint  Patient presents with   Hypertension   Hyperlipidemia   Follow-up    HPI Tiffany Schaefer presents for a follow-up visit for hypertension, low vitamin D, and labs.  Labs done last year, discussed these, need new labs  Low vitamin D and hair loss.  Constipation --abdominal pain and has had weight loss of 16 lbs over the past 6 months.       Current Medication: Outpatient Encounter Medications as of 09/09/2022  Medication Sig   albuterol (VENTOLIN HFA) 108 (90 Base) MCG/ACT inhaler Inhale 2 puffs into the lungs every 6 (six) hours as needed for wheezing or shortness of breath.   aspirin EC 81 MG tablet Take 81 mg by mouth daily with lunch.    bisacodyl (DULCOLAX) 5 MG EC tablet Take 5 mg by mouth daily as needed for moderate constipation.   carvedilol (COREG) 25 MG tablet TAKE 1 TABLET(25 MG) BY MOUTH DAILY WITH LUNCH   cyclobenzaprine (FLEXERIL) 10 MG tablet Take 1 tablet (10 mg total) by mouth at bedtime as needed for muscle spasms.   furosemide (LASIX) 20 MG tablet TAKE 1 TABLET(20 MG) BY MOUTH DAILY   losartan-hydrochlorothiazide (HYZAAR) 100-25 MG tablet TAKE 1 TABLET BY MOUTH EVERY DAY WITH LUNCH   lovastatin (MEVACOR) 40 MG tablet Take 1 tablet (40 mg total) by mouth at bedtime.   potassium chloride SA (KLOR-CON M) 20 MEQ tablet TAKE 1 TABLET(20 MEQ) BY MOUTH DAILY   triamcinolone cream (KENALOG) 0.5 % Apply topically 2 (two) times daily.   No facility-administered encounter medications on file as of 09/09/2022.    Surgical History: Past Surgical History:  Procedure Laterality Date   CESAREAN SECTION     denies 09/03/17   COLONOSCOPY     HEMORRHOID SURGERY     HYSTEROSCOPY WITH D & C N/A 09/11/2017   Procedure: DILATATION AND CURETTAGE /HYSTEROSCOPY;  Surgeon: Vena Austria, MD;   Location: [facility] ORS;  Service: Gynecology;  Laterality: N/A;    Medical History: Past Medical History:  Diagnosis Date   Arthritis    Hyperlipidemia    Hypertension    Sinus infection    Sleep apnea     Family History: Family History  Problem Relation Age of Onset   Stroke Mother    Diabetes Brother    Rheumatic fever Brother    Arthritis/Rheumatoid Brother     Social History   Socioeconomic History   Marital status: Married    Spouse name: Not on file   Number of children: Not on file   Years of education: Not on file   Highest education level: Not on file  Occupational History   Not on file  Tobacco Use   Smoking status: Every Day    Packs/day: 1.00    Years: 30.00    Additional pack years: 0.00    Total pack years: 30.00    Types: Cigarettes   Smokeless tobacco: Never  Vaping Use   Vaping Use: Never used  Substance and Sexual Activity   Alcohol use: Yes    Alcohol/week: 0.0 standard drinks of alcohol    Comment: occasionally   Drug use: No   Sexual activity: Not Currently    Birth control/protection: None  Other Topics Concern   Not on file  Social History Narrative   Not  on file   Social Determinants of Health   Financial Resource Strain: Low Risk  (08/01/2020)   Overall Financial Resource Strain (CARDIA)    Difficulty of Paying Living Expenses: Not hard at all  Food Insecurity: Not on file  Transportation Needs: Not on file  Physical Activity: Not on file  Stress: Not on file  Social Connections: Not on file  Intimate Partner Violence: Not on file      Review of Systems  Constitutional:  Positive for fatigue. Negative for chills and unexpected weight change.  HENT:  Negative for congestion, rhinorrhea, sneezing and sore throat.        Hair loss  Eyes:  Negative for redness.  Respiratory: Negative.  Negative for cough, chest tightness, shortness of breath and wheezing.   Cardiovascular: Negative.  Negative for chest pain and palpitations.   Gastrointestinal:  Negative for abdominal pain, constipation, diarrhea, nausea and vomiting.  Genitourinary:  Negative for dysuria and frequency.  Musculoskeletal:  Positive for arthralgias. Negative for back pain, joint swelling and neck pain.  Skin:  Negative for rash.  Neurological: Negative.  Negative for tremors and numbness.  Hematological:  Negative for adenopathy. Does not bruise/bleed easily.  Psychiatric/Behavioral:  Negative for behavioral problems (Depression), self-injury, sleep disturbance and suicidal ideas. The patient is not nervous/anxious.     Vital Signs: BP 130/68   Pulse 81   Temp (!) 97.3 F (36.3 C)   Resp 16   Ht 5\' 3"  (1.6 m)   Wt 202 lb (91.6 kg)   SpO2 98%   BMI 35.78 kg/m    Physical Exam Vitals reviewed.  Constitutional:      General: She is not in acute distress.    Appearance: Normal appearance. She is obese. She is not ill-appearing.  HENT:     Head: Normocephalic and atraumatic.  Eyes:     Pupils: Pupils are equal, round, and reactive to light.  Cardiovascular:     Rate and Rhythm: Normal rate and regular rhythm.  Pulmonary:     Effort: Pulmonary effort is normal. No respiratory distress.  Neurological:     Mental Status: She is alert and oriented to person, place, and time.  Psychiatric:        Mood and Affect: Mood normal.        Behavior: Behavior normal.        Assessment/Plan: 1. Vitamin D deficiency Routine labs ordered - CBC with Differential/Platelet - CMP14+EGFR - Lipid Profile - Vitamin D (25 hydroxy) - TSH + free T4  2. Unexplained weight loss Thyroid labs ordered - TSH + free T4  3. Hair loss Thyroid labs ordered - TSH + free T4  4. Hypokalemia Routine labs ordered - CBC with Differential/Platelet - CMP14+EGFR - Lipid Profile - Vitamin D (25 hydroxy) - TSH + free T4  5. Mixed hyperlipidemia Routine labs ordered - CBC with Differential/Platelet - CMP14+EGFR - Lipid Profile - Vitamin D (25  hydroxy) - TSH + free T4   General Counseling: Tiffany Schaefer verbalizes understanding of the findings of todays visit and agrees with plan of treatment. I have discussed any further diagnostic evaluation that may be needed or ordered today. We also reviewed her medications today. she has been encouraged to call the office with any questions or concerns that should arise related to todays visit.    Orders Placed This Encounter  Procedures   CBC with Differential/Platelet   CMP14+EGFR   Lipid Profile   Vitamin D (25 hydroxy)   TSH +  free T4    No orders of the defined types were placed in this encounter.   Return for previously scheduled, CPE, Eliya Geiman PCP in november.   Total time spent:30 Minutes Time spent includes review of chart, medications, test results, and follow up plan with the patient.   [place] Controlled Substance Database was reviewed by me.  This patient was seen by Sallyanne Kuster, FNP-C in collaboration with Dr. Beverely Risen as a part of collaborative care agreement.   Kanani Mowbray R. Tedd Sias, MSN, FNP-C Internal medicine

## 2022-09-10 ENCOUNTER — Other Ambulatory Visit: Payer: Self-pay | Admitting: Nurse Practitioner

## 2022-09-10 DIAGNOSIS — R945 Abnormal results of liver function studies: Secondary | ICD-10-CM

## 2022-09-10 DIAGNOSIS — I10 Essential (primary) hypertension: Secondary | ICD-10-CM

## 2022-09-10 DIAGNOSIS — E559 Vitamin D deficiency, unspecified: Secondary | ICD-10-CM

## 2022-09-10 DIAGNOSIS — E876 Hypokalemia: Secondary | ICD-10-CM

## 2022-09-10 LAB — CMP14+EGFR
ALT: 12 IU/L (ref 0–32)
AST: 21 IU/L (ref 0–40)
Albumin/Globulin Ratio: 0.6 — ABNORMAL LOW (ref 1.2–2.2)
Albumin: 3.2 g/dL — ABNORMAL LOW (ref 3.9–4.9)
Alkaline Phosphatase: 48 IU/L (ref 44–121)
BUN/Creatinine Ratio: 18 (ref 12–28)
BUN: 18 mg/dL (ref 8–27)
Bilirubin Total: 0.3 mg/dL (ref 0.0–1.2)
CO2: 24 mmol/L (ref 20–29)
Calcium: 8.8 mg/dL (ref 8.7–10.3)
Chloride: 100 mmol/L (ref 96–106)
Creatinine, Ser: 0.99 mg/dL (ref 0.57–1.00)
Globulin, Total: 5.2 g/dL — ABNORMAL HIGH (ref 1.5–4.5)
Glucose: 94 mg/dL (ref 70–99)
Potassium: 3.3 mmol/L — ABNORMAL LOW (ref 3.5–5.2)
Sodium: 138 mmol/L (ref 134–144)
Total Protein: 8.4 g/dL (ref 6.0–8.5)
eGFR: 62 mL/min/{1.73_m2} (ref >59)

## 2022-09-10 LAB — CBC WITH DIFFERENTIAL/PLATELET
Basophils Absolute: 0 10*3/uL (ref 0.0–0.2)
Basos: 1 %
EOS (ABSOLUTE): 0.1 10*3/uL (ref 0.0–0.4)
Eos: 2 %
Hematocrit: 34.9 % (ref 34.0–46.6)
Hemoglobin: 11.3 g/dL (ref 11.1–15.9)
Immature Grans (Abs): 0 10*3/uL (ref 0.0–0.1)
Immature Granulocytes: 0 %
Lymphocytes Absolute: 1.9 10*3/uL (ref 0.7–3.1)
Lymphs: 32 %
MCH: 28.6 pg (ref 26.6–33.0)
MCHC: 32.4 g/dL (ref 31.5–35.7)
MCV: 88 fL (ref 79–97)
Monocytes Absolute: 0.7 10*3/uL (ref 0.1–0.9)
Monocytes: 11 %
Neutrophils Absolute: 3.3 10*3/uL (ref 1.4–7.0)
Neutrophils: 54 %
Platelets: 184 10*3/uL (ref 150–450)
RBC: 3.95 x10E6/uL (ref 3.77–5.28)
RDW: 14.9 % (ref 11.7–15.4)
WBC: 6 10*3/uL (ref 3.4–10.8)

## 2022-09-10 LAB — LIPID PANEL
Chol/HDL Ratio: 4.8 ratio — ABNORMAL HIGH (ref 0.0–4.4)
Cholesterol, Total: 178 mg/dL (ref 100–199)
HDL: 37 mg/dL — ABNORMAL LOW (ref >39)
LDL Chol Calc (NIH): 120 mg/dL — ABNORMAL HIGH (ref 0–99)
Triglycerides: 114 mg/dL (ref 0–149)
VLDL Cholesterol Cal: 21 mg/dL (ref 5–40)

## 2022-09-10 LAB — TSH+FREE T4
Free T4: 1.27 ng/dL (ref 0.82–1.77)
TSH: 1.54 u[IU]/mL (ref 0.450–4.500)

## 2022-09-10 LAB — VITAMIN D 25 HYDROXY (VIT D DEFICIENCY, FRACTURES): Vit D, 25-Hydroxy: 20.1 ng/mL — ABNORMAL LOW (ref 30.0–100.0)

## 2022-09-10 MED ORDER — POTASSIUM CHLORIDE CRYS ER 20 MEQ PO TBCR: EXTENDED_RELEASE_TABLET | ORAL | 1 refills | Status: DC

## 2022-09-10 MED ORDER — VITAMIN D (ERGOCALCIFEROL) 1.25 MG (50000 UNIT) PO CAPS: 50000.0000 [IU] | ORAL_CAPSULE | ORAL | 1 refills | Status: DC

## 2022-09-10 NOTE — Progress Notes (Signed)
I have reviewed her lab results: Potassium is low -- she needs to increase her potassium to twice daily please With the albumin and globulin levels being abnormal -- I would like to do an ultrasound of the liver to further evaluate why these levels are still abnormal.  Cholesterol levels -- LDL is elevated at 120, HDL improved slightly to 37.  Vitamin D is low still at 20,1 -- I will send a prescription for weekly vitamin D prescription Thyroid labs and CBC are normal.

## 2022-09-11 ENCOUNTER — Telehealth: Payer: Self-pay

## 2022-09-11 ENCOUNTER — Telehealth: Payer: Self-pay | Admitting: Nurse Practitioner

## 2022-09-11 NOTE — Telephone Encounter (Signed)
Notified patient of U/S appointment date, arrival time, location and npo after midnight-Tiffany Schaefer

## 2022-09-11 NOTE — Telephone Encounter (Signed)
Pt advised for labs and gave Shivali back for liver ultrasound appt

## 2022-09-11 NOTE — Telephone Encounter (Signed)
-----   Message from Sallyanne Kuster, NP sent at 09/10/2022  8:19 AM EDT ----- I have reviewed her lab results: Potassium is low -- she needs to increase her potassium to twice daily please With the albumin and globulin levels being abnormal -- I would like to do an ultrasound of the liver to further evaluate why these levels are still abnormal.  Cholesterol levels -- LDL is elevated at 120, HDL improved slightly to 37.  Vitamin D is low still at 20,1 -- I will send a prescription for weekly vitamin D prescription Thyroid labs and CBC are normal.

## 2022-09-23 ENCOUNTER — Ambulatory Visit
Admission: RE | Admit: 2022-09-23 | Discharge: 2022-09-23 | Disposition: A | Discharge status: Home / Self Care | Payer: Medicare Other | Source: Ambulatory Visit | Attending: Nurse Practitioner | Admitting: Nurse Practitioner

## 2022-09-23 DIAGNOSIS — R945 Abnormal results of liver function studies: Secondary | ICD-10-CM | POA: Diagnosis not present

## 2022-10-07 ENCOUNTER — Telehealth: Payer: Self-pay

## 2022-10-08 ENCOUNTER — Telehealth: Payer: Self-pay

## 2022-10-08 NOTE — Telephone Encounter (Signed)
Message sent to staff to discuss result with patient

## 2022-10-08 NOTE — Telephone Encounter (Signed)
Spoke with patient regarding kidney ultrasound.

## 2022-11-20 IMAGING — CT CT NECK W/O CM
5 series · 16 of 33 positions shown, 18 images · non-contrast
Comparison: None.

CLINICAL DATA: Parotid region mass

EXAM:
CT NECK WITHOUT CONTRAST
TECHNIQUE: Multidetector CT imaging of the neck was performed following the
standard protocol without intravenous contrast.
RADIATION DOSE REDUCTION: This exam was performed according to the
departmental dose-optimization program which includes automated
exposure control, adjustment of the mA and/or kV according to
patient size and/or use of iterative reconstruction technique.

[Series 2: axial neck neck (person_name) 2.00 · axial · 0.59mm/px · z∈[-728,-618]mm · 3 of 111 slices shown, 4 images]
[im 28/111  soft-tissue]
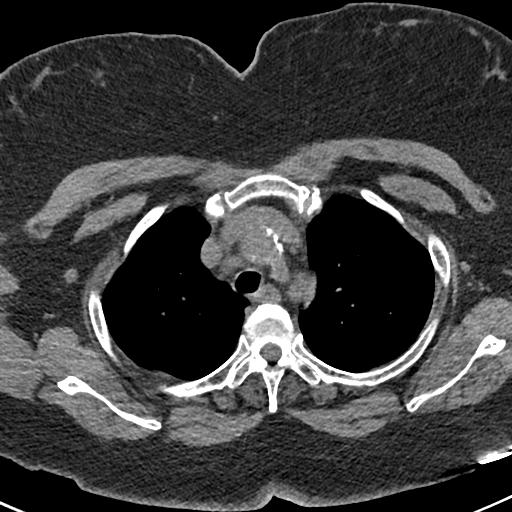
[im 28/111  bone]
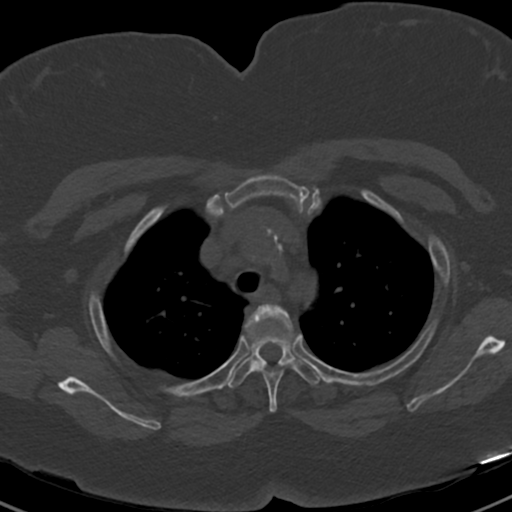
[im 56/111  bone]
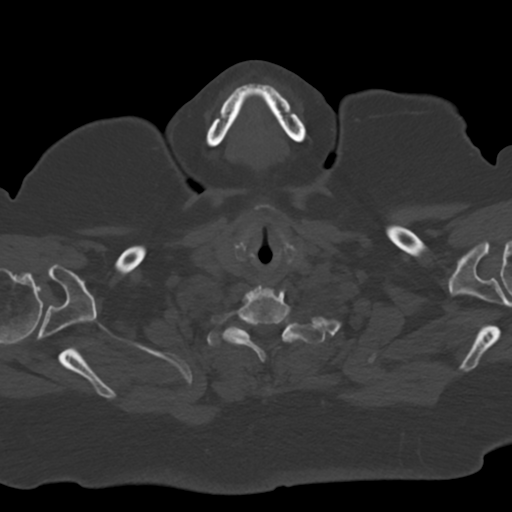
[im 83/111  bone]
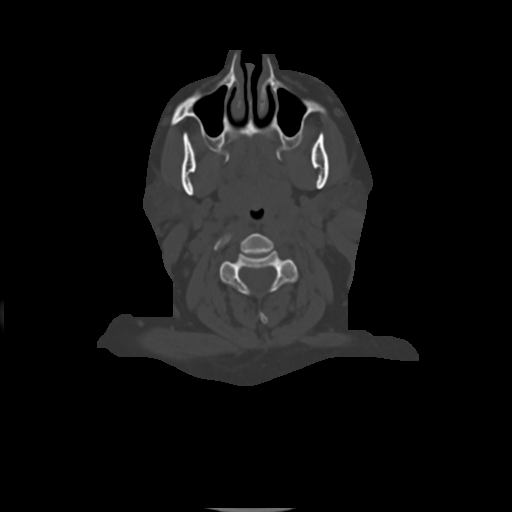

[Series 3: axial bone neck neck 2.00 · axial · 0.59mm/px · z∈[-710,-636]mm · 2 of 111 slices shown]
[im 37/111  bone]
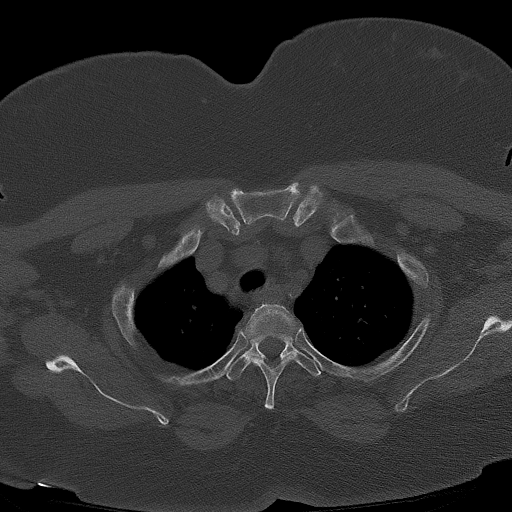
[im 74/111  bone]
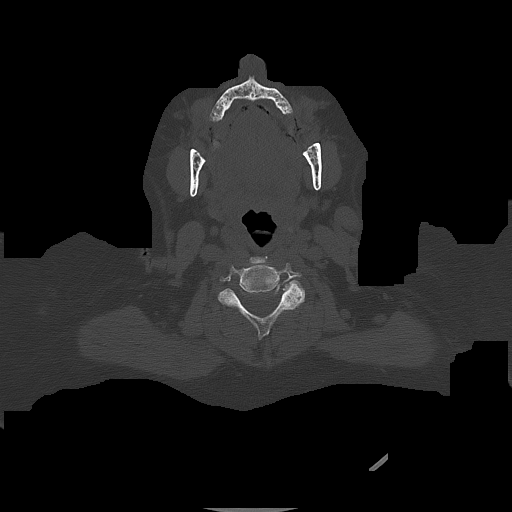

[Series 4: cor neck neck (person_name) 2.00 cor · coronal · 0.51mm/px · 3 of 140 slices shown]
[im 30/140  bone]
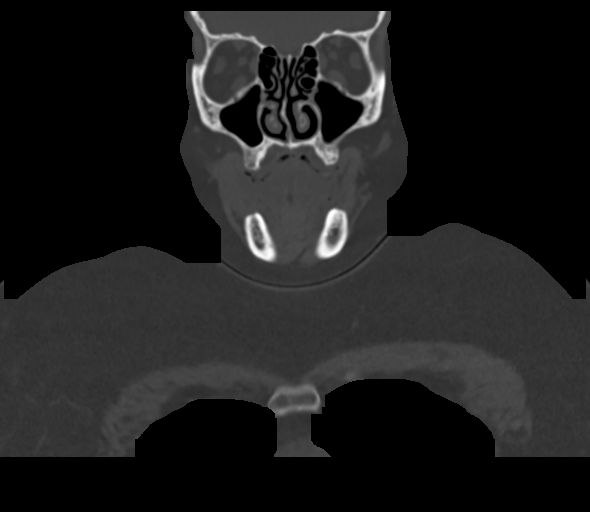
[im 57/140  bone]
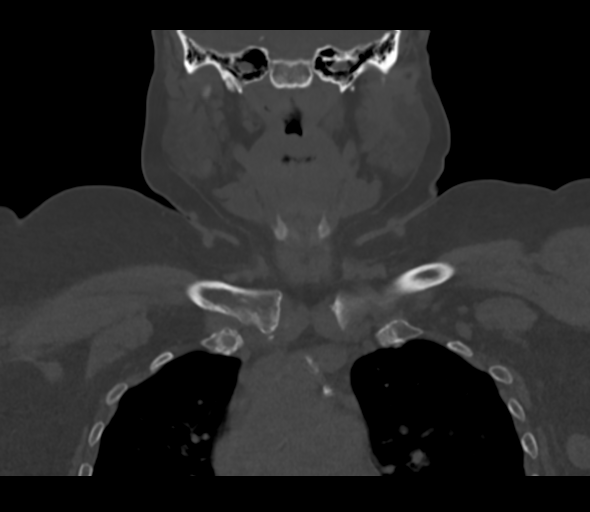
[im 84/140  bone]
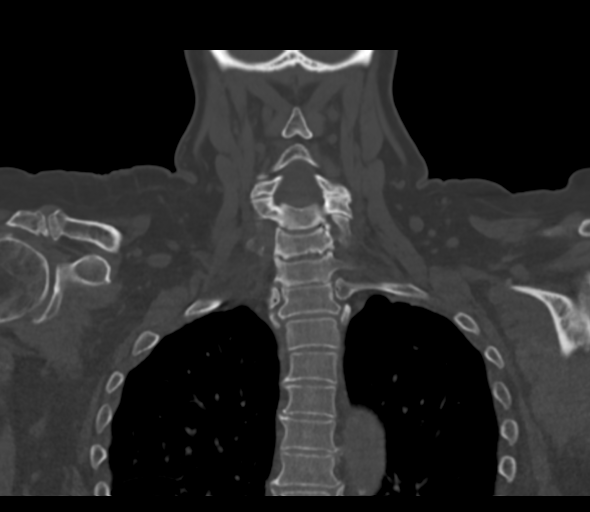

[Series 6: sag neck neck (person_name) 2.00 sag · sagittal · 0.51mm/px · 5 of 151 slices shown, 6 images]
[im 51/151  bone]
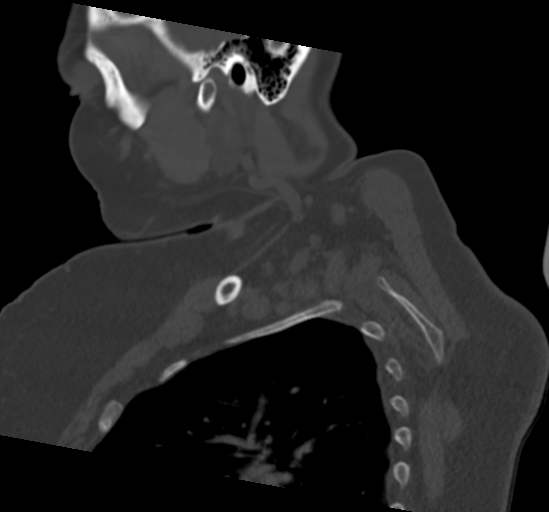
[im 63/151  bone]
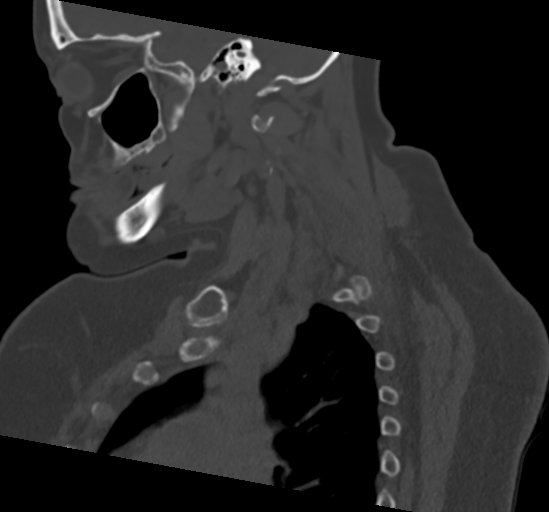
[im 76/151  soft-tissue]
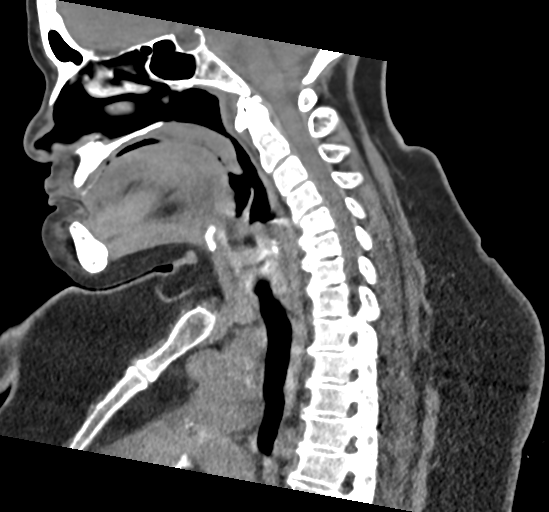
[im 76/151  bone]
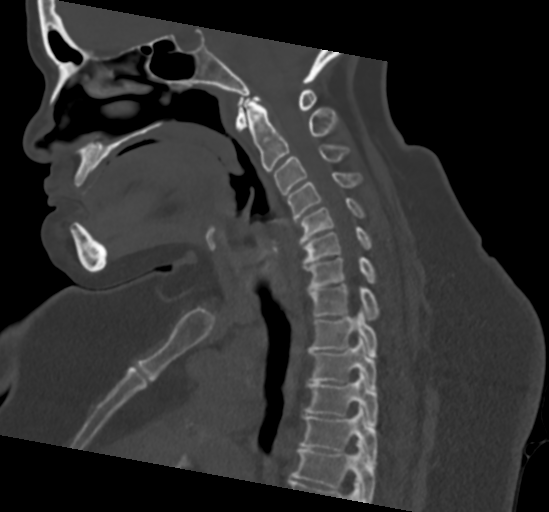
[im 88/151  bone]
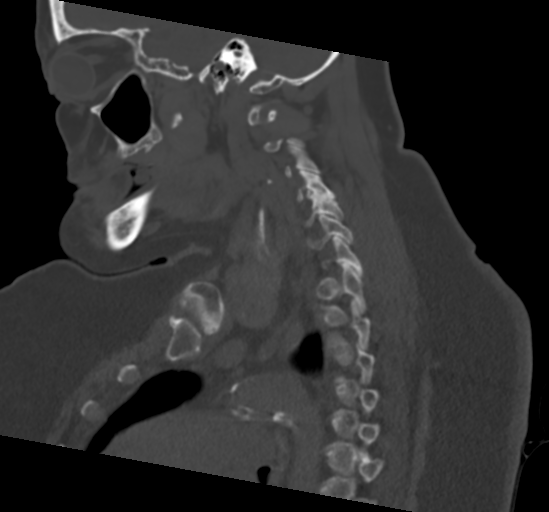
[im 101/151  bone]
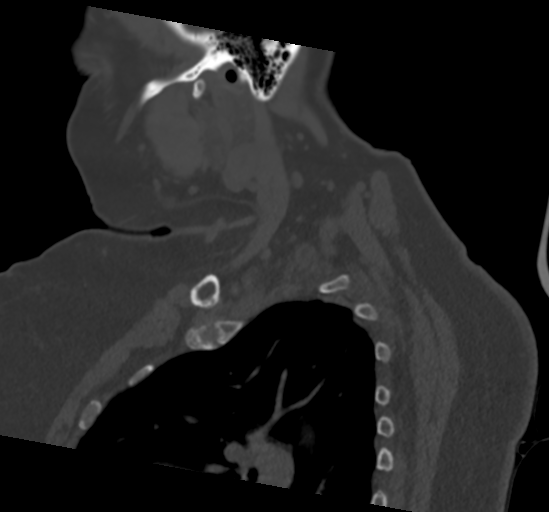

[Series 8: ax oropharynx neck neck (person_name) 2.00 ax · axial · 0.55mm/px · z∈[-759,-631]mm · 3 of 131 slices shown]
[im 33/131  bone]
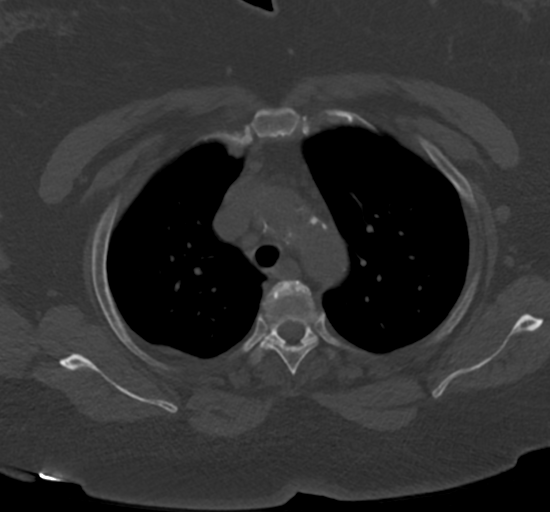
[im 66/131  bone]
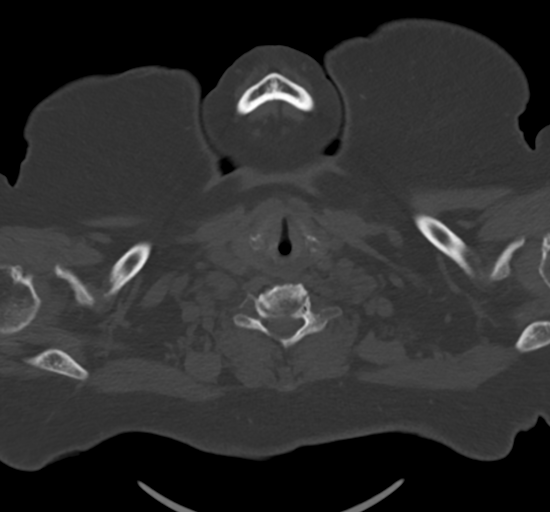
[im 98/131  bone]
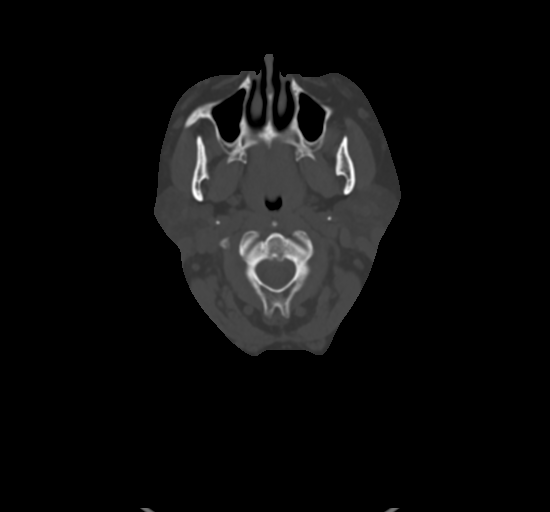

[16 of 33 positions shown; findings below may reference images not displayed]

FINDINGS: Pharynx and larynx: Unremarkable.  No mass or swelling.

Salivary glands: There is a mildly heterogeneous mass of the
inferior left parotid involving superficial and deep portions. This
measures 1.5 x 2.6 x 3.2 cm. Right parotid and both submandibular
glands are unremarkable.

Thyroid: Normal.

Lymph nodes: No enlarged nodes.

Vascular: Calcified plaque at the common carotid bifurcations.

Limited intracranial: No acute abnormality.

Visualized orbits: Unremarkable.

Mastoids and visualized paranasal sinuses: Retained secretions right
sphenoid sinus. Mastoid air cells are clear.

Skeleton: Degenerative changes of the included spine.

Upper chest: Mild centrilobular emphysema.

Other: None.
IMPRESSION: 3 cm left inferior parotid mass likely reflects a primary parotid
neoplasm. Tissue sampling recommended to exclude malignancy.

## 2022-11-27 ENCOUNTER — Telehealth: Payer: Self-pay

## 2022-12-04 NOTE — Telephone Encounter (Signed)
Pt.notified

## 2022-12-10 ENCOUNTER — Other Ambulatory Visit: Payer: Self-pay

## 2022-12-10 DIAGNOSIS — E782 Mixed hyperlipidemia: Secondary | ICD-10-CM

## 2022-12-10 MED ORDER — LOVASTATIN 40 MG PO TABS: 40.0000 mg | ORAL_TABLET | Freq: Every day | ORAL | 3 refills | Status: DC

## 2023-01-07 IMAGING — US IR BIOPSY CORE SALIVARY GLAND
1 series · 13 of 13 positions shown · non-contrast
Comparison: none

INDICATION: Left parotid mass

[Series 1: us core biopsy (salivary gland/parotid gland) · 13 of 13 slices shown]
[im 1/13]
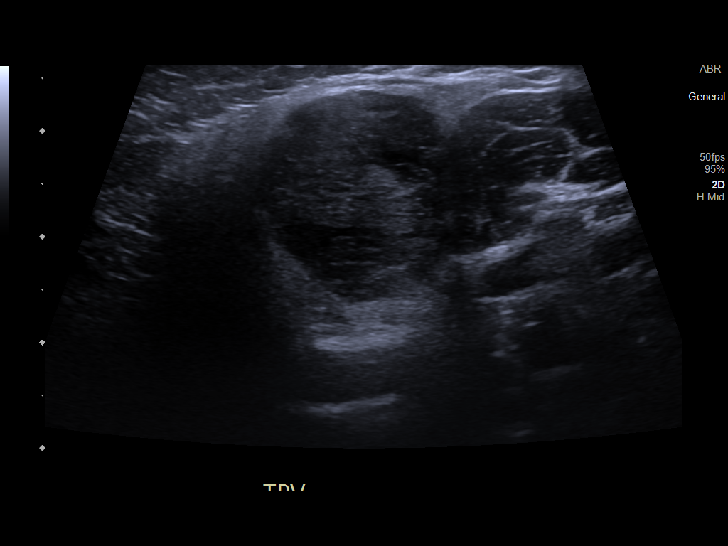
[im 2/13]
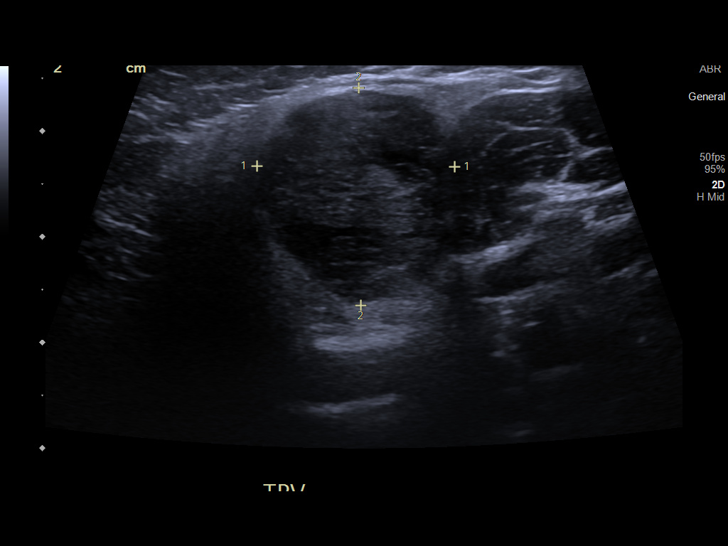
[im 3/13]
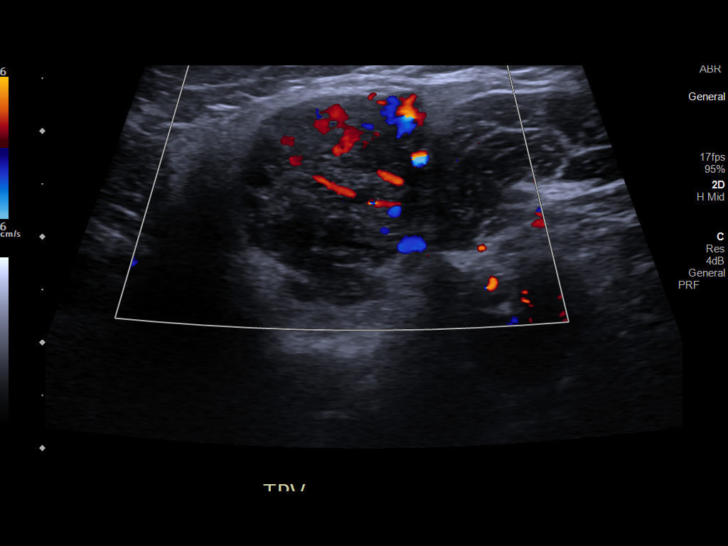
[im 4/13]
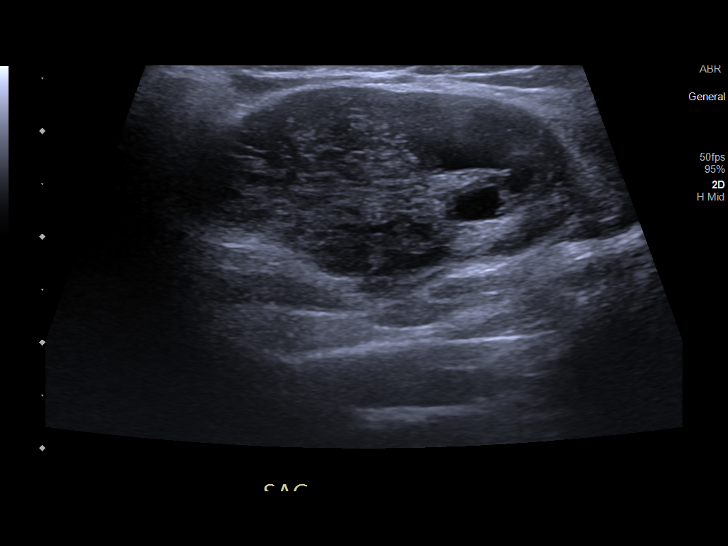
[im 5/13]
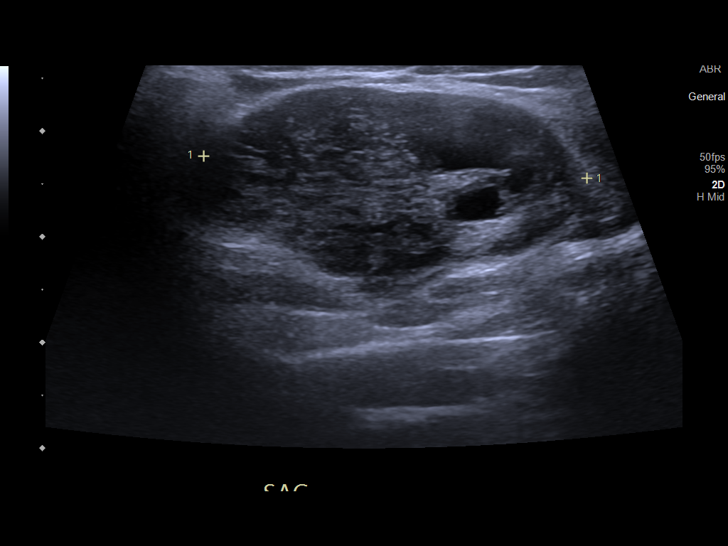
[im 6/13]
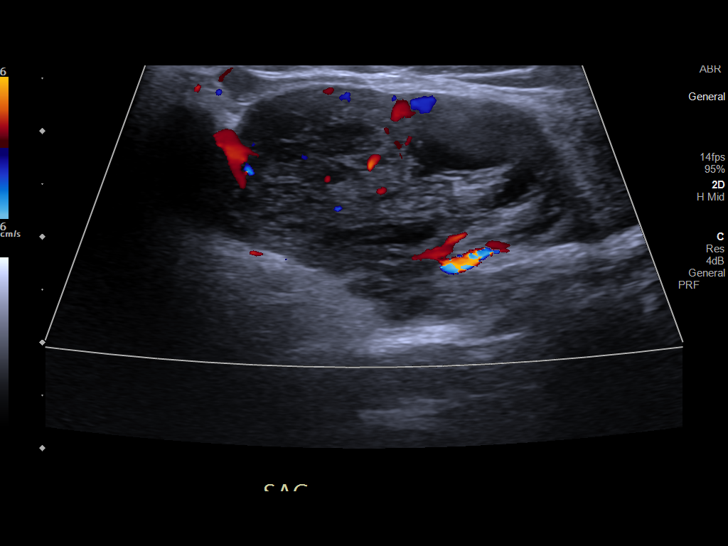
[im 7/13]
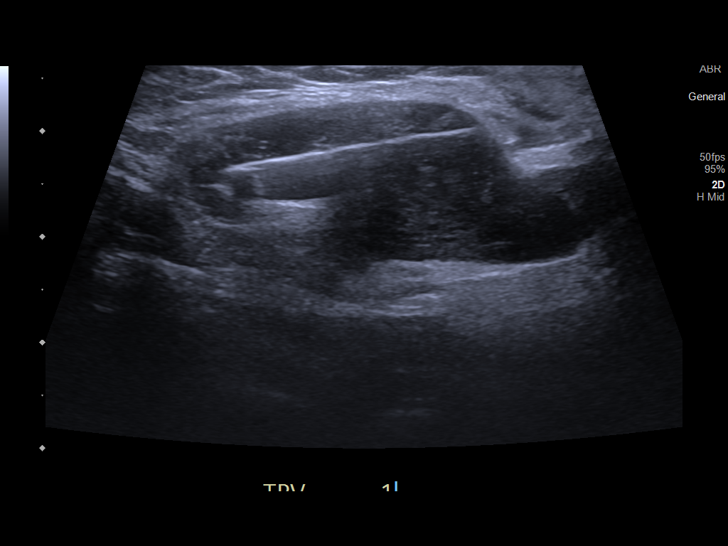
[im 8/13]
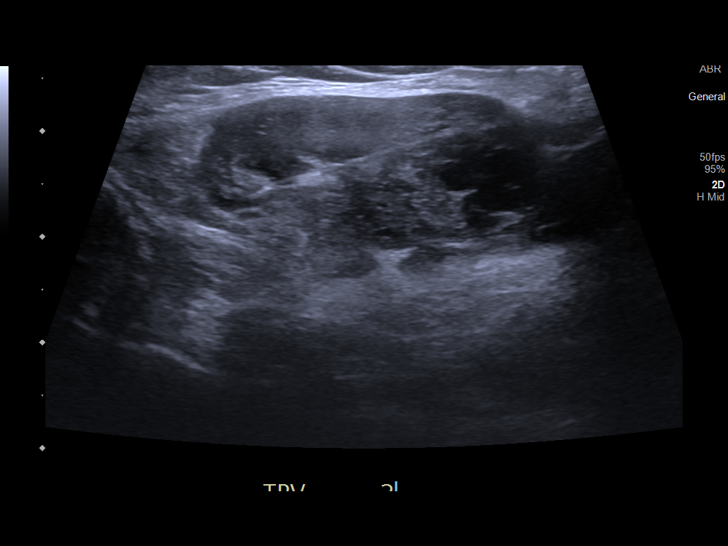
[im 9/13]
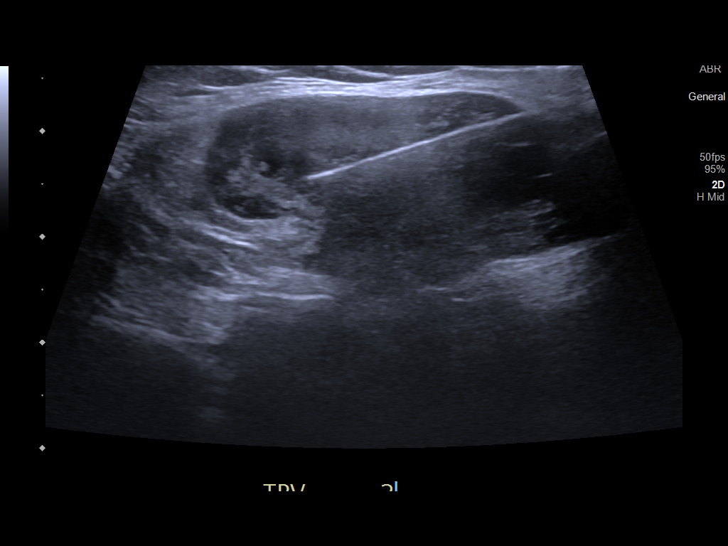
[im 10/13]
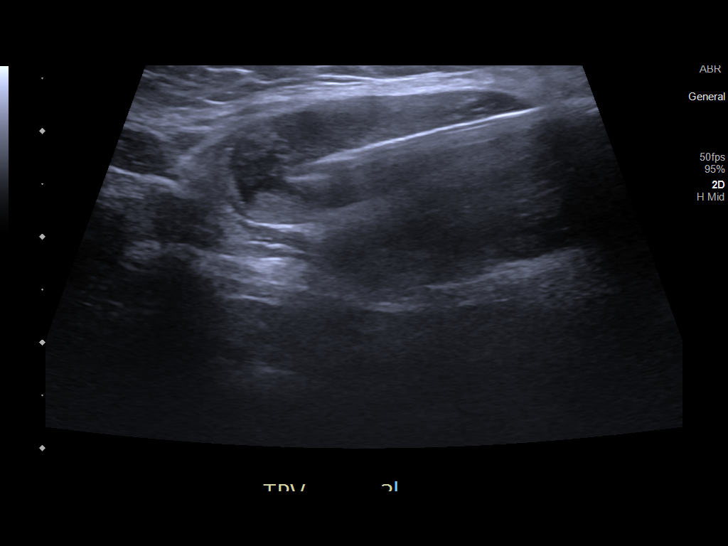
[im 11/13]
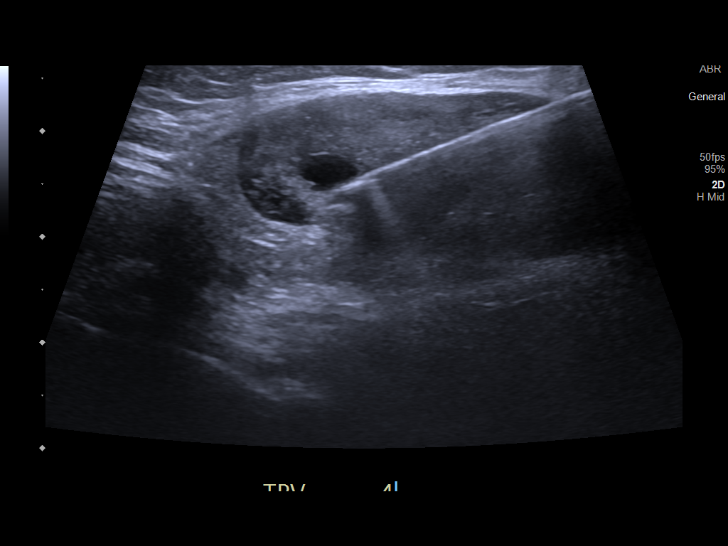
[im 12/13]
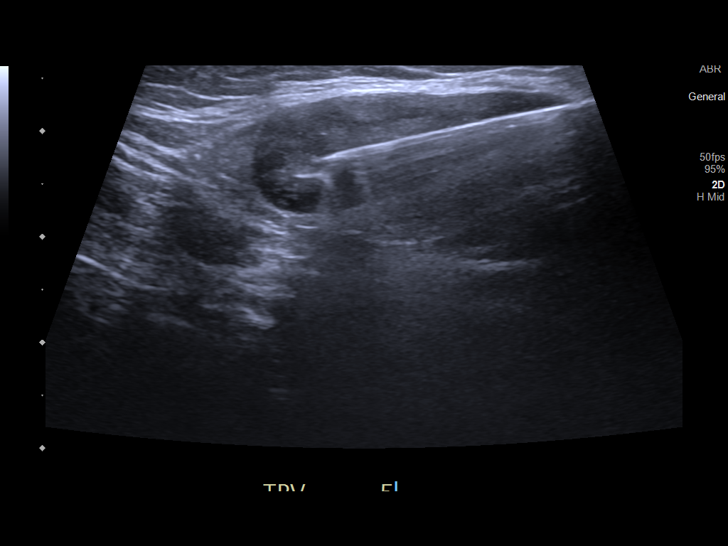
[im 13/13]
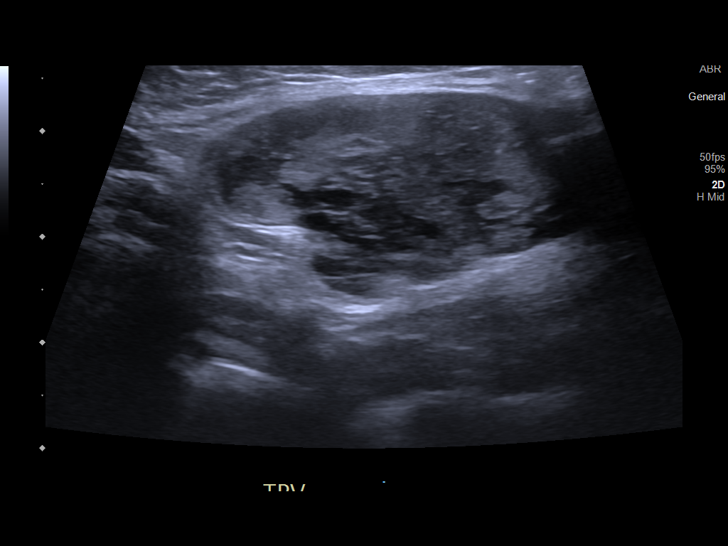

[13 of 13 positions shown; findings below may reference images not displayed]

EXAM:
Ultrasound-guided core needle biopsy of left parotid mass

MEDICATIONS:
None.

ANESTHESIA/SEDATION:
Local analgesia

COMPLICATIONS:
None immediate.

PROCEDURE:
Informed written consent was obtained from the patient after a
thorough discussion of the procedural risks, benefits and
alternatives. All questions were addressed. Maximal Sterile Barrier
Technique was utilized including caps, mask, sterile gowns, sterile
gloves, sterile drape, hand hygiene and skin antiseptic. A timeout
was performed prior to the initiation of the procedure.

The patient was placed supine on the exam table. Limited ultrasound
of the neck was performed for planning purposes. This demonstrated a
heterogeneous mass within the inferior aspect of the left parotid
gland consistent with recent cross-sectional imaging. Skin entry
site was marked, and the overlying skin was prepped and draped in
the standard sterile fashion. Local analgesia was obtained with 1%
lidocaine. Using ultrasound guidance, core needle biopsy was
performed of the identified mass using an 18 gauge core biopsy
device x5 total passes. Specimens were submitted in formalin to
pathology for further handling. Limited postprocedure imaging
demonstrated no hematoma or other complicating feature. A clean
dressing was placed after manual hemostasis. The patient tolerated
the procedure well and was discharged in stable condition.
IMPRESSION: Successful ultrasound-guided core needle biopsy of left parotid
mass.

## 2023-01-08 DIAGNOSIS — Z1231 Encounter for screening mammogram for malignant neoplasm of breast: Secondary | ICD-10-CM | POA: Diagnosis not present

## 2023-01-30 ENCOUNTER — Other Ambulatory Visit: Payer: Self-pay | Admitting: Nurse Practitioner

## 2023-01-30 DIAGNOSIS — I10 Essential (primary) hypertension: Secondary | ICD-10-CM

## 2023-02-19 ENCOUNTER — Telehealth: Payer: Self-pay

## 2023-02-19 DIAGNOSIS — E782 Mixed hyperlipidemia: Secondary | ICD-10-CM

## 2023-02-19 DIAGNOSIS — R945 Abnormal results of liver function studies: Secondary | ICD-10-CM

## 2023-02-19 DIAGNOSIS — E876 Hypokalemia: Secondary | ICD-10-CM

## 2023-02-19 DIAGNOSIS — E559 Vitamin D deficiency, unspecified: Secondary | ICD-10-CM

## 2023-02-21 ENCOUNTER — Other Ambulatory Visit: Payer: Self-pay | Admitting: Nurse Practitioner

## 2023-02-21 DIAGNOSIS — E559 Vitamin D deficiency, unspecified: Secondary | ICD-10-CM

## 2023-02-21 NOTE — Telephone Encounter (Signed)
Please review and send 

## 2023-02-27 DIAGNOSIS — D37030 Neoplasm of uncertain behavior of the parotid salivary glands: Secondary | ICD-10-CM | POA: Diagnosis not present

## 2023-02-27 DIAGNOSIS — H903 Sensorineural hearing loss, bilateral: Secondary | ICD-10-CM | POA: Diagnosis not present

## 2023-03-04 NOTE — Telephone Encounter (Signed)
Pt advised we placed labs order

## 2023-03-05 DIAGNOSIS — E559 Vitamin D deficiency, unspecified: Secondary | ICD-10-CM | POA: Diagnosis not present

## 2023-03-05 DIAGNOSIS — R945 Abnormal results of liver function studies: Secondary | ICD-10-CM | POA: Diagnosis not present

## 2023-03-05 DIAGNOSIS — E782 Mixed hyperlipidemia: Secondary | ICD-10-CM | POA: Diagnosis not present

## 2023-03-05 DIAGNOSIS — E876 Hypokalemia: Secondary | ICD-10-CM | POA: Diagnosis not present

## 2023-03-06 LAB — CMP14+EGFR
ALT: 13 [IU]/L (ref 0–32)
AST: 25 [IU]/L (ref 0–40)
Albumin: 3.2 g/dL — ABNORMAL LOW (ref 3.9–4.9)
Alkaline Phosphatase: 50 [IU]/L (ref 44–121)
BUN/Creatinine Ratio: 22 (ref 12–28)
BUN: 20 mg/dL (ref 8–27)
Bilirubin Total: 0.4 mg/dL (ref 0.0–1.2)
CO2: 26 mmol/L (ref 20–29)
Calcium: 9.1 mg/dL (ref 8.7–10.3)
Chloride: 98 mmol/L (ref 96–106)
Creatinine, Ser: 0.91 mg/dL (ref 0.57–1.00)
Globulin, Total: 5.4 g/dL — ABNORMAL HIGH (ref 1.5–4.5)
Glucose: 91 mg/dL (ref 70–99)
Potassium: 3.3 mmol/L — ABNORMAL LOW (ref 3.5–5.2)
Sodium: 137 mmol/L (ref 134–144)
Total Protein: 8.6 g/dL — ABNORMAL HIGH (ref 6.0–8.5)
eGFR: 68 mL/min/{1.73_m2} (ref >59)

## 2023-03-06 LAB — CBC WITH DIFFERENTIAL/PLATELET
Basophils Absolute: 0 10*3/uL (ref 0.0–0.2)
Basos: 0 %
EOS (ABSOLUTE): 0.1 10*3/uL (ref 0.0–0.4)
Eos: 2 %
Hematocrit: 49.1 % — ABNORMAL HIGH (ref 34.0–46.6)
Hemoglobin: 15.4 g/dL (ref 11.1–15.9)
Immature Grans (Abs): 0 10*3/uL (ref 0.0–0.1)
Immature Granulocytes: 0 %
Lymphocytes Absolute: 2 10*3/uL (ref 0.7–3.1)
Lymphs: 37 %
MCH: 27.8 pg (ref 26.6–33.0)
MCHC: 31.4 g/dL — ABNORMAL LOW (ref 31.5–35.7)
MCV: 89 fL (ref 79–97)
Monocytes Absolute: 0.6 10*3/uL (ref 0.1–0.9)
Monocytes: 12 %
Neutrophils Absolute: 2.7 10*3/uL (ref 1.4–7.0)
Neutrophils: 49 %
Platelets: 140 10*3/uL — ABNORMAL LOW (ref 150–450)
RBC: 5.54 x10E6/uL — ABNORMAL HIGH (ref 3.77–5.28)
RDW: 14.8 % (ref 11.7–15.4)
WBC: 5.4 10*3/uL (ref 3.4–10.8)

## 2023-03-06 LAB — VITAMIN D 25 HYDROXY (VIT D DEFICIENCY, FRACTURES): Vit D, 25-Hydroxy: 57.4 ng/mL (ref 30.0–100.0)

## 2023-03-06 LAB — LIPID PANEL
Chol/HDL Ratio: 4.1 ratio (ref 0.0–4.4)
Cholesterol, Total: 165 mg/dL (ref 100–199)
HDL: 40 mg/dL (ref >39)
LDL Chol Calc (NIH): 107 mg/dL — ABNORMAL HIGH (ref 0–99)
Triglycerides: 99 mg/dL (ref 0–149)
VLDL Cholesterol Cal: 18 mg/dL (ref 5–40)

## 2023-03-06 NOTE — Progress Notes (Signed)
Will discuss results at upcoming visit.

## 2023-03-17 ENCOUNTER — Ambulatory Visit (INDEPENDENT_AMBULATORY_CARE_PROVIDER_SITE_OTHER): Payer: Medicare Other | Admitting: Internal Medicine

## 2023-03-17 ENCOUNTER — Encounter: Payer: Self-pay | Admitting: Internal Medicine

## 2023-03-17 VITALS — BP 100/70 | HR 64 | Temp 98.3°F | Resp 16 | Ht 63.0 in | Wt 199.4 lb

## 2023-03-17 DIAGNOSIS — E876 Hypokalemia: Secondary | ICD-10-CM

## 2023-03-17 DIAGNOSIS — Z0001 Encounter for general adult medical examination with abnormal findings: Secondary | ICD-10-CM

## 2023-03-17 DIAGNOSIS — D696 Thrombocytopenia, unspecified: Secondary | ICD-10-CM

## 2023-03-17 DIAGNOSIS — I10 Essential (primary) hypertension: Secondary | ICD-10-CM | POA: Diagnosis not present

## 2023-03-17 DIAGNOSIS — F17210 Nicotine dependence, cigarettes, uncomplicated: Secondary | ICD-10-CM | POA: Diagnosis not present

## 2023-03-17 NOTE — Progress Notes (Signed)
[facility] [address] [place], [place] [address]  Internal MEDICINE  Office Visit Note  Patient Name: Tiffany Schaefer  [id number]  [id number]  Date of Service: 03/17/2023  Chief Complaint  Patient presents with   Hyperlipidemia   Hypertension   Medicare Wellness     HPI Pt is here for routine health maintenance examination Feels well, had blisters over upper back, used steroids cream now feels better, not sure if this was an allergic reaction. Continues to be a smoker, will need CT lund for screening  Labs reviewed with pt, low platelets and Vit D  Current Medication: Outpatient Encounter Medications as of 03/17/2023  Medication Sig   albuterol (VENTOLIN HFA) 108 (90 Base) MCG/ACT inhaler Inhale 2 puffs into the lungs every 6 (six) hours as needed for wheezing or shortness of breath.   aspirin EC 81 MG tablet Take 81 mg by mouth daily with lunch.    bisacodyl (DULCOLAX) 5 MG EC tablet Take 5 mg by mouth daily as needed for moderate constipation.   carvedilol (COREG) 25 MG tablet TAKE 1 TABLET(25 MG) BY MOUTH DAILY WITH LUNCH   cyclobenzaprine (FLEXERIL) 10 MG tablet Take 1 tablet (10 mg total) by mouth at bedtime as needed for muscle spasms.   furosemide (LASIX) 20 MG tablet TAKE 1 TABLET(20 MG) BY MOUTH DAILY   losartan-hydrochlorothiazide (HYZAAR) 100-25 MG tablet TAKE 1 TABLET BY MOUTH EVERY DAY WITH LUNCH   lovastatin (MEVACOR) 40 MG tablet Take 1 tablet (40 mg total) by mouth at bedtime.   potassium chloride SA (KLOR-CON M) 20 MEQ tablet TAKE 1 TABLET(20 MEQ) BY MOUTH twice DAILY   triamcinolone cream (KENALOG) 0.5 % Apply topically 2 (two) times daily.   Vitamin D, Ergocalciferol, (DRISDOL) 1.25 MG (50000 UNIT) CAPS capsule TAKE 1 CAPSULE BY MOUTH EVERY 7 DAYS   No facility-administered encounter medications on file as of 03/17/2023.    Surgical History: Past Surgical History:  Procedure Laterality Date   CESAREAN SECTION     denies 09/03/17    COLONOSCOPY     HEMORRHOID SURGERY     HYSTEROSCOPY WITH D & C N/A 09/11/2017   Procedure: DILATATION AND CURETTAGE /HYSTEROSCOPY;  Surgeon: Vena Austria, MD;  Location: [facility] ORS;  Service: Gynecology;  Laterality: N/A;    Medical History: Past Medical History:  Diagnosis Date   Arthritis    Hyperlipidemia    Hypertension    Sinus infection    Sleep apnea     Family History: Family History  Problem Relation Age of Onset   Stroke Mother    Diabetes Brother    Rheumatic fever Brother    Arthritis/Rheumatoid Brother     Social History: Social History   Socioeconomic History   Marital status: Married    Spouse name: Not on file   Number of children: Not on file   Years of education: Not on file   Highest education level: Not on file  Occupational History   Not on file  Tobacco Use   Smoking status: Every Day    Current packs/day: 1.00    Average packs/day: 1 pack/day for 30.0 years (30.0 ttl pk-yrs)    Types: Cigarettes   Smokeless tobacco: Never  Vaping Use   Vaping status: Never Used  Substance and Sexual Activity   Alcohol use: Yes    Alcohol/week: 0.0 standard drinks of alcohol    Comment: occasionally   Drug use: No   Sexual activity: Not Currently    Birth  control/protection: None  Other Topics Concern   Not on file  Social History Narrative   Not on file   Social Determinants of Health   Financial Resource Strain: Low Risk  (08/01/2020)   Overall Financial Resource Strain (CARDIA)    Difficulty of Paying Living Expenses: Not hard at all  Food Insecurity: Not on file  Transportation Needs: Not on file  Physical Activity: Not on file  Stress: Not on file  Social Connections: Not on file      Review of Systems  Constitutional:  Negative for chills, fatigue and unexpected weight change.  HENT:  Positive for postnasal drip. Negative for congestion, rhinorrhea, sneezing and sore throat.   Eyes:  Negative for redness.  Respiratory:  Negative for  cough, chest tightness and shortness of breath.   Cardiovascular:  Negative for chest pain and palpitations.  Gastrointestinal:  Negative for abdominal pain, constipation, diarrhea, nausea and vomiting.  Genitourinary:  Negative for dysuria and frequency.  Musculoskeletal:  Negative for arthralgias, back pain, joint swelling and neck pain.  Skin:  Positive for rash.  Neurological: Negative.  Negative for tremors and numbness.  Hematological:  Negative for adenopathy. Does not bruise/bleed easily.  Psychiatric/Behavioral:  Negative for behavioral problems (Depression), sleep disturbance and suicidal ideas. The patient is not nervous/anxious.      Vital Signs: BP 100/70   Pulse 64   Temp 98.3 F (36.8 C)   Resp 16   Ht 5\' 3"  (1.6 m)   Wt 199 lb 6.4 oz (90.4 kg)   SpO2 99%   BMI 35.32 kg/m    Physical Exam Constitutional:      Appearance: Normal appearance.  HENT:     Head: Normocephalic and atraumatic.     Nose: Nose normal.     Mouth/Throat:     Mouth: Mucous membranes are moist.     Pharynx: No posterior oropharyngeal erythema.  Eyes:     Extraocular Movements: Extraocular movements intact.     Pupils: Pupils are equal, round, and reactive to light.  Cardiovascular:     Pulses: Normal pulses.     Heart sounds: Normal heart sounds.  Pulmonary:     Effort: Pulmonary effort is normal.     Breath sounds: Normal breath sounds.  Neurological:     General: No focal deficit present.     Mental Status: She is alert.  Psychiatric:        Mood and Affect: Mood normal.        Behavior: Behavior normal.      LABS: Recent Results (from the past 2160 hour(s))  CBC with Differential/Platelet     Status: Abnormal   Collection Time: 03/05/23 10:01 AM  Result Value Ref Range   WBC 5.4 3.4 - 10.8 x10E3/uL   RBC 5.54 (H) 3.77 - 5.28 x10E6/uL   Hemoglobin 15.4 11.1 - 15.9 g/dL   Hematocrit 16.1 (H) 09.6 - 46.6 %   MCV 89 79 - 97 fL   MCH 27.8 26.6 - 33.0 pg   MCHC 31.4 (L)  31.5 - 35.7 g/dL   RDW 04.5 40.9 - 81.1 %   Platelets 140 (L) 150 - 450 x10E3/uL   Neutrophils 49 Not Estab. %   Lymphs 37 Not Estab. %   Monocytes 12 Not Estab. %   Eos 2 Not Estab. %   Basos 0 Not Estab. %   Neutrophils Absolute 2.7 1.4 - 7.0 x10E3/uL   Lymphocytes Absolute 2.0 0.7 - 3.1 x10E3/uL   Monocytes  Absolute 0.6 0.1 - 0.9 x10E3/uL   EOS (ABSOLUTE) 0.1 0.0 - 0.4 x10E3/uL   Basophils Absolute 0.0 0.0 - 0.2 x10E3/uL   Immature Granulocytes 0 Not Estab. %   Immature Grans (Abs) 0.0 0.0 - 0.1 x10E3/uL  CMP14+EGFR     Status: Abnormal   Collection Time: 03/05/23 10:01 AM  Result Value Ref Range   Glucose 91 70 - 99 mg/dL   BUN 20 8 - 27 mg/dL   Creatinine, Ser 7.84 0.57 - 1.00 mg/dL   eGFR 68 >69 GE/XBM/8.41   BUN/Creatinine Ratio 22 12 - 28   Sodium 137 134 - 144 mmol/L   Potassium 3.3 (L) 3.5 - 5.2 mmol/L   Chloride 98 96 - 106 mmol/L   CO2 26 20 - 29 mmol/L   Calcium 9.1 8.7 - 10.3 mg/dL   Total Protein 8.6 (H) 6.0 - 8.5 g/dL   Albumin 3.2 (L) 3.9 - 4.9 g/dL   Globulin, Total 5.4 (H) 1.5 - 4.5 g/dL   Bilirubin Total 0.4 0.0 - 1.2 mg/dL   Alkaline Phosphatase 50 44 - 121 IU/L   AST 25 0 - 40 IU/L   ALT 13 0 - 32 IU/L  Lipid Profile     Status: Abnormal   Collection Time: 03/05/23 10:01 AM  Result Value Ref Range   Cholesterol, Total 165 100 - 199 mg/dL   Triglycerides 99 0 - 149 mg/dL   HDL 40 >32 mg/dL   VLDL Cholesterol Cal 18 5 - 40 mg/dL   LDL Chol Calc (NIH) 440 (H) 0 - 99 mg/dL   Chol/HDL Ratio 4.1 0.0 - 4.4 ratio    Comment:                                   T. Chol/HDL Ratio                                             Men  Women                               1/2 Avg.Risk  3.4    3.3                                   Avg.Risk  5.0    4.4                                2X Avg.Risk  9.6    7.1                                3X Avg.Risk 23.4   11.0   Vitamin D (25 hydroxy)     Status: None   Collection Time: 03/05/23 10:01 AM  Result Value Ref Range    Vit D, 25-Hydroxy 57.4 30.0 - 100.0 ng/mL    Comment: Vitamin D deficiency has been defined by the Institute of Medicine and an Endocrine Society practice guideline as a level of serum 25-OH vitamin D less than 20 ng/mL (1,2). The Endocrine Society went on to further define  vitamin D insufficiency as a level between 21 and 29 ng/mL (2). 1. IOM (Institute of Medicine). 2010. Dietary reference    intakes for calcium and D. [place]: The    [employer]. 2. Holick MF, Binkley Belknap, Bischoff-Ferrari HA, et al.    Evaluation, treatment, and prevention of vitamin D    deficiency: an Endocrine Society clinical practice    guideline. JCEM. 2011 Jul; 96(7):1911-30.        Assessment/Plan: 1. Encounter for general adult medical examination with abnormal findings All PHM is updated   2. Thrombocytopenia (HCC) Gradual decline in platelets and CBC abnormality.?? Meds, will need to see hematology  - Ambulatory referral to Hematology / Oncology  3. Cigarette nicotine dependence without complication Pt needs screening  - CT CHEST LUNG CANCER SCREENING LOW DOSE WO CONTRAST; Future What is lung cancer screening?   Lung cancer screening is a way in which doctors check the lungs for early signs of cancer in people who have no symptoms of lung cancer. Doctors suggest this screening for certain people who are at high risk of lung cancer because they smoke, or used to smoke. Although screening is not likely to be helpful for all smokers, doctors do think it might help prevent cancer deaths in people who smoke a lot or smoked for many years (even if they have already quit). Researchers have studied chest X-rays and "low-dose CT scans," two types of imaging tests, to see if they are good screening tools. Imaging tests create pictures of the inside of the body. A low-dose CT scan uses much less radiation than a typical CT scan and shows a more detailed image of the lungs than a standard X-ray.  It turns out that chest X-rays do not work for screening for lung cancer. Low-dose CT scans, on the other hand, are helpful screening tools for some people at high risk of lung cancer. The goal of lung cancer screening is to find cancer early, before it has a chance to grow, spread, or cause problems. Several large studies found that smokers who were screened with low-dose CT scans were less likely to die of lung cancer than those who were screened with a standard X-ray.  The best way to lower your chances of getting or dying from lung cancer is to quit smoking. No matter how much or how long you have smoked, quitting is a good idea. Quitting now will reduce your chances not only of lung problems, but also of heart disease and many forms of cancer.   4. Benign hypertension Continue to take all meds as before, might need to lower the dose in future   5. Hypokalemia Will increase k to 2 tabs a day x 3 days and then rest of the week she can take one tab qd   General Counseling: Candiss verbalizes understanding of the findings of todays visit and agrees with plan of treatment. I have discussed any further diagnostic evaluation that may be needed or ordered today. We also reviewed her medications today. she has been encouraged to call the office with any questions or concerns that should arise related to todays visit.    Counseling:  [place] Controlled Substance Database was reviewed by me.  Orders Placed This Encounter  Procedures   CT CHEST LUNG CANCER SCREENING LOW DOSE WO CONTRAST   Ambulatory referral to Hematology / Oncology    No orders of the defined types were placed in this encounter.   Total time spent:45 Minutes  Time spent includes review of chart, medications, test results, and follow up plan with the patient.     Lyndon Code, MD  Internal Medicine

## 2023-03-18 DIAGNOSIS — H524 Presbyopia: Secondary | ICD-10-CM | POA: Diagnosis not present

## 2023-03-18 DIAGNOSIS — H348322 Tributary (branch) retinal vein occlusion, left eye, stable: Secondary | ICD-10-CM | POA: Diagnosis not present

## 2023-03-18 DIAGNOSIS — H25013 Cortical age-related cataract, bilateral: Secondary | ICD-10-CM | POA: Diagnosis not present

## 2023-03-18 DIAGNOSIS — H2513 Age-related nuclear cataract, bilateral: Secondary | ICD-10-CM | POA: Diagnosis not present

## 2023-03-24 ENCOUNTER — Ambulatory Visit: Payer: Medicare Other

## 2023-03-27 ENCOUNTER — Ambulatory Visit: Payer: Medicare Other

## 2023-04-23 ENCOUNTER — Encounter: Payer: Self-pay | Admitting: Oncology

## 2023-04-23 ENCOUNTER — Other Ambulatory Visit: Payer: Self-pay

## 2023-04-23 ENCOUNTER — Inpatient Hospital Stay: Payer: Medicare Other | Attending: Oncology | Admitting: Oncology

## 2023-04-23 ENCOUNTER — Inpatient Hospital Stay: Payer: Medicare Other

## 2023-04-23 VITALS — BP 139/84 | HR 86 | Temp 97.2°F | Resp 18 | Wt 200.1 lb

## 2023-04-23 DIAGNOSIS — F17210 Nicotine dependence, cigarettes, uncomplicated: Secondary | ICD-10-CM | POA: Insufficient documentation

## 2023-04-23 DIAGNOSIS — D696 Thrombocytopenia, unspecified: Secondary | ICD-10-CM | POA: Insufficient documentation

## 2023-04-23 DIAGNOSIS — E785 Hyperlipidemia, unspecified: Secondary | ICD-10-CM | POA: Diagnosis not present

## 2023-04-23 DIAGNOSIS — D510 Vitamin B12 deficiency anemia due to intrinsic factor deficiency: Secondary | ICD-10-CM | POA: Insufficient documentation

## 2023-04-23 DIAGNOSIS — Z7982 Long term (current) use of aspirin: Secondary | ICD-10-CM | POA: Insufficient documentation

## 2023-04-23 DIAGNOSIS — D649 Anemia, unspecified: Secondary | ICD-10-CM | POA: Diagnosis not present

## 2023-04-23 DIAGNOSIS — R7989 Other specified abnormal findings of blood chemistry: Secondary | ICD-10-CM | POA: Diagnosis not present

## 2023-04-23 DIAGNOSIS — Z79899 Other long term (current) drug therapy: Secondary | ICD-10-CM | POA: Insufficient documentation

## 2023-04-23 DIAGNOSIS — I10 Essential (primary) hypertension: Secondary | ICD-10-CM | POA: Insufficient documentation

## 2023-04-23 LAB — IRON AND TIBC
Iron: 71 ug/dL (ref 28–170)
Saturation Ratios: 25 % (ref 10.4–31.8)
TIBC: 280 ug/dL (ref 250–450)
UIBC: 209 ug/dL

## 2023-04-23 LAB — TECHNOLOGIST SMEAR REVIEW
Plt Morphology: NORMAL
RBC MORPHOLOGY: NORMAL
WBC MORPHOLOGY: NORMAL

## 2023-04-23 LAB — CBC WITH DIFFERENTIAL/PLATELET
Abs Immature Granulocytes: 0.03 10*3/uL (ref 0.00–0.07)
Basophils Absolute: 0 10*3/uL (ref 0.0–0.1)
Basophils Relative: 1 %
Eosinophils Absolute: 0.1 10*3/uL (ref 0.0–0.5)
Eosinophils Relative: 2 %
HCT: 35.4 % — ABNORMAL LOW (ref 36.0–46.0)
Hemoglobin: 11.4 g/dL — ABNORMAL LOW (ref 12.0–15.0)
Immature Granulocytes: 1 %
Lymphocytes Relative: 41 %
Lymphs Abs: 2.1 10*3/uL (ref 0.7–4.0)
MCH: 28.5 pg (ref 26.0–34.0)
MCHC: 32.2 g/dL (ref 30.0–36.0)
MCV: 88.5 fL (ref 80.0–100.0)
Monocytes Absolute: 0.5 10*3/uL (ref 0.1–1.0)
Monocytes Relative: 9 %
Neutro Abs: 2.4 10*3/uL (ref 1.7–7.7)
Neutrophils Relative %: 46 %
Platelets: 184 10*3/uL (ref 150–400)
RBC: 4 MIL/uL (ref 3.87–5.11)
RDW: 16.9 % — ABNORMAL HIGH (ref 11.5–15.5)
WBC: 5.2 10*3/uL (ref 4.0–10.5)
nRBC: 0 % (ref 0.0–0.2)

## 2023-04-23 LAB — HEPATITIS PANEL, ACUTE
HCV Ab: NONREACTIVE
Hep A IgM: UNDETERMINED — AB
Hep B C IgM: NONREACTIVE
Hepatitis B Surface Ag: NONREACTIVE

## 2023-04-23 LAB — RETIC PANEL
Immature Retic Fract: 7.7 % (ref 2.3–15.9)
RBC.: 3.98 MIL/uL (ref 3.87–5.11)
Retic Count, Absolute: 45.4 10*3/uL (ref 19.0–186.0)
Retic Ct Pct: 1.1 % (ref 0.4–3.1)
Reticulocyte Hemoglobin: 30.3 pg (ref >27.9)

## 2023-04-23 LAB — HIV ANTIBODY (ROUTINE TESTING W REFLEX): HIV Screen 4th Generation wRfx: NONREACTIVE

## 2023-04-23 LAB — LACTATE DEHYDROGENASE: LDH: 113 U/L (ref 98–192)

## 2023-04-23 LAB — VITAMIN B12: Vitamin B-12: 140 pg/mL — ABNORMAL LOW (ref 180–914)

## 2023-04-23 LAB — FERRITIN: Ferritin: 85 ng/mL (ref 11–307)

## 2023-04-23 LAB — FOLATE: Folate: 7.4 ng/mL (ref >5.9)

## 2023-04-23 NOTE — Progress Notes (Signed)
Hematology/Oncology Consult Note [phone] [phone]     REFERRING PROVIDER: Lyndon Code, MD  CHIEF COMPLAINTS/REASON FOR VISIT:  Evaluation of thrombocytopenia  ASSESSMENT & PLAN:   Thrombocytopenia (HCC) Recent decrease in platelet count to 140,000, very mild.  No history of low platelet count. likely reactive thrombocytopenia . For the work up of patient's thrombocytopenia, I recommend checking CBC;CMP, immature platelet fraction, LDH; smear review, folate, Vitamin B12, hepatitis, HIV, flowcytometry and monoclonal gammopathy workup.    Normocytic anemia Mild, check iron tibc ferritin    Orders Placed This Encounter  Procedures   Vitamin B12    Standing Status:   Future    Number of Occurrences:   1    Expected Date:   04/23/2023    Expiration Date:   04/22/2024   Folate    Standing Status:   Future    Number of Occurrences:   1    Expected Date:   04/23/2023    Expiration Date:   04/22/2024   CBC with Differential/Platelet    Standing Status:   Future    Number of Occurrences:   1    Expected Date:   04/23/2023    Expiration Date:   04/22/2024   Protein electrophoresis, serum    Standing Status:   Future    Number of Occurrences:   1    Expected Date:   04/23/2023    Expiration Date:   04/22/2024   Flow cytometry panel-leukemia/lymphoma work-up    Standing Status:   Future    Number of Occurrences:   1    Expected Date:   04/23/2023    Expiration Date:   04/22/2024   Lactate dehydrogenase    Standing Status:   Future    Number of Occurrences:   1    Expected Date:   04/23/2023    Expiration Date:   04/22/2024   HIV Antibody (routine testing w rflx)    Standing Status:   Future    Number of Occurrences:   1    Expected Date:   04/23/2023    Expiration Date:   04/22/2024   Hepatitis panel, acute    Standing Status:   Future    Number of Occurrences:   1    Expected Date:   04/23/2023    Expiration Date:   04/22/2024    Technologist smear review    Standing Status:   Future    Number of Occurrences:   1    Expected Date:   04/23/2023    Expiration Date:   04/22/2024    Clinical information::   thrombocytopenia   Follow up TBD All questions were answered. The patient knows to call the clinic with any problems, questions or concerns.  Rickard Patience, MD, PhD [facility] Hematology Oncology 04/23/2023   HISTORY OF PRESENTING ILLNESS:  Tiffany Schaefer is a 72 y.o. female who was seen in consultation at the request of Lyndon Code, MD for evaluation of thrombocytopenia   Patient had abnormal Labs which showed decreased platelet counts at  140,000  Reviewed patient's previous labs. Thrombocytopenia is new onset.  Denies weight loss, fever, chills, fatigue, night sweats.  Denies hematochezia, hematuria, hematemesis, epistaxis, black tarry stool.  Patient has no easy bruising.    Denies history hepatitis or HIV infection Denies history of chronic liver disease Denies routine alcohol consumption. Denies dietary restrictions.  Denies herbal medication    MEDICAL HISTORY:  Past Medical History:  Diagnosis Date  Arthritis    Hyperlipidemia    Hypertension    Sinus infection    Sleep apnea     SURGICAL HISTORY: Past Surgical History:  Procedure Laterality Date   CESAREAN SECTION     denies 09/03/17   COLONOSCOPY     HEMORRHOID SURGERY     HYSTEROSCOPY WITH D & C N/A 09/11/2017   Procedure: DILATATION AND CURETTAGE /HYSTEROSCOPY;  Surgeon: Vena Austria, MD;  Location: [facility] ORS;  Service: Gynecology;  Laterality: N/A;    SOCIAL HISTORY: Social History   Socioeconomic History   Marital status: Married    Spouse name: Not on file   Number of children: Not on file   Years of education: Not on file   Highest education level: Not on file  Occupational History   Not on file  Tobacco Use   Smoking status: Every Day    Current packs/day: 0.50    Average packs/day: 0.7 packs/day for 61.0 years  (45.5 ttl pk-yrs)    Types: Cigarettes    Start date: 76   Smokeless tobacco: Never  Vaping Use   Vaping status: Never Used  Substance and Sexual Activity   Alcohol use: Not Currently    Comment: occasionally   Drug use: No   Sexual activity: Not Currently    Birth control/protection: None  Other Topics Concern   Not on file  Social History Narrative   Not on file   Social Drivers of Health   Financial Resource Strain: Low Risk  (08/01/2020)   Overall Financial Resource Strain (CARDIA)    Difficulty of Paying Living Expenses: Not hard at all  Food Insecurity: No Food Insecurity (04/23/2023)   Hunger Vital Sign    Worried About Running Out of Food in the Last Year: Never true    Ran Out of Food in the Last Year: Never true  Transportation Needs: No Transportation Needs (04/23/2023)   PRAPARE - Administrator, Civil Service (Medical): No    Lack of Transportation (Non-Medical): No  Physical Activity: Not on file  Stress: Not on file  Social Connections: Not on file  Intimate Partner Violence: Not At Risk (04/23/2023)   Humiliation, Afraid, Rape, and Kick questionnaire    Fear of Current or Ex-Partner: No    Emotionally Abused: No    Physically Abused: No    Sexually Abused: No    FAMILY HISTORY: Family History  Problem Relation Age of Onset   Stroke Mother    Cirrhosis Father    Diabetes Brother    Rheumatic fever Brother    Arthritis/Rheumatoid Brother     ALLERGIES:  has no known allergies.  MEDICATIONS:  Current Outpatient Medications  Medication Sig Dispense Refill   aspirin EC 81 MG tablet Take 81 mg by mouth daily with lunch.      carvedilol (COREG) 25 MG tablet TAKE 1 TABLET(25 MG) BY MOUTH DAILY WITH LUNCH 90 tablet 3   furosemide (LASIX) 20 MG tablet TAKE 1 TABLET(20 MG) BY MOUTH DAILY 90 tablet 1   losartan-hydrochlorothiazide (HYZAAR) 100-25 MG tablet TAKE 1 TABLET BY MOUTH EVERY DAY WITH LUNCH 90 tablet 1   lovastatin (MEVACOR) 40 MG  tablet Take 1 tablet (40 mg total) by mouth at bedtime. 90 tablet 3   potassium chloride SA (KLOR-CON M) 20 MEQ tablet TAKE 1 TABLET(20 MEQ) BY MOUTH twice DAILY 180 tablet 1   triamcinolone cream (KENALOG) 0.5 % Apply topically 2 (two) times daily. 454 g 1   Vitamin  D, Ergocalciferol, (DRISDOL) 1.25 MG (50000 UNIT) CAPS capsule TAKE 1 CAPSULE BY MOUTH EVERY 7 DAYS 12 capsule 1   albuterol (VENTOLIN HFA) 108 (90 Base) MCG/ACT inhaler Inhale 2 puffs into the lungs every 6 (six) hours as needed for wheezing or shortness of breath. (Patient not taking: Reported on 04/23/2023) 18 g 3   bisacodyl (DULCOLAX) 5 MG EC tablet Take 5 mg by mouth daily as needed for moderate constipation. (Patient not taking: Reported on 04/23/2023)     cyclobenzaprine (FLEXERIL) 10 MG tablet Take 1 tablet (10 mg total) by mouth at bedtime as needed for muscle spasms. (Patient not taking: Reported on 04/23/2023) 30 tablet 0   No current facility-administered medications for this visit.    Review of Systems  Constitutional:  Negative for appetite change, chills, fatigue and fever.  HENT:   Negative for hearing loss and voice change.   Eyes:  Negative for eye problems.  Respiratory:  Negative for chest tightness and cough.   Cardiovascular:  Negative for chest pain.  Gastrointestinal:  Negative for abdominal distention, abdominal pain and blood in stool.  Endocrine: Negative for hot flashes.  Genitourinary:  Negative for difficulty urinating and frequency.   Musculoskeletal:  Negative for arthralgias.  Skin:  Negative for itching and rash.  Neurological:  Negative for extremity weakness.  Hematological:  Negative for adenopathy.  Psychiatric/Behavioral:  Negative for confusion.     PHYSICAL EXAMINATION: ECOG PERFORMANCE STATUS: 0 - Asymptomatic Vitals:   04/23/23 0928  BP: 139/84  Pulse: 86  Resp: 18  Temp: (!) 97.2 F (36.2 C)   Filed Weights   04/23/23 0928  Weight: 200 lb 1.6 oz (90.8 kg)    Physical  Exam Constitutional:      General: She is not in acute distress. HENT:     Head: Normocephalic and atraumatic.  Eyes:     General: No scleral icterus. Cardiovascular:     Rate and Rhythm: Normal rate and regular rhythm.     Heart sounds: Normal heart sounds.  Pulmonary:     Effort: Pulmonary effort is normal. No respiratory distress.     Breath sounds: No wheezing.  Abdominal:     General: Bowel sounds are normal. There is no distension.     Palpations: Abdomen is soft.  Musculoskeletal:        General: No deformity. Normal range of motion.     Cervical back: Normal range of motion and neck supple.  Skin:    General: Skin is warm and dry.     Findings: No erythema or rash.  Neurological:     Mental Status: She is alert and oriented to person, place, and time. Mental status is at baseline.     Cranial Nerves: No cranial nerve deficit.     Coordination: Coordination normal.  Psychiatric:        Mood and Affect: Mood normal.      LABORATORY DATA:  I have reviewed the data as listed    Latest Ref Rng & Units 04/23/2023    9:59 AM 03/05/2023   10:01 AM 09/09/2022   10:15 AM  CBC  WBC 4.0 - 10.5 K/uL 5.2  5.4  6.0   Hemoglobin 12.0 - 15.0 g/dL 91.4  78.2  95.6   Hematocrit 36.0 - 46.0 % 35.4  49.1  34.9   Platelets 150 - 400 K/uL 184  140  184       Latest Ref Rng & Units 03/05/2023   10:01 AM  09/09/2022   10:15 AM 03/18/2022    1:07 PM  CMP  Glucose 70 - 99 mg/dL 91  94  86   BUN 8 - 27 mg/dL 20  18  20    Creatinine 0.57 - 1.00 mg/dL 4.09  8.11  9.14   Sodium 134 - 144 mmol/L 137  138  139   Potassium 3.5 - 5.2 mmol/L 3.3  3.3  3.6   Chloride 96 - 106 mmol/L 98  100  100   CO2 20 - 29 mmol/L 26  24  23    Calcium 8.7 - 10.3 mg/dL 9.1  8.8  8.8   Total Protein 6.0 - 8.5 g/dL 8.6  8.4  8.8   Total Bilirubin 0.0 - 1.2 mg/dL 0.4  0.3  0.3   Alkaline Phos 44 - 121 IU/L 50  48  47   AST 0 - 40 IU/L 25  21  26    ALT 0 - 32 IU/L 13  12  13       RADIOGRAPHIC  STUDIES: I have personally reviewed the radiological images as listed and agreed with the findings in the report.  No results found.

## 2023-04-23 NOTE — Assessment & Plan Note (Signed)
Mild, check iron tibc ferritin

## 2023-04-23 NOTE — Assessment & Plan Note (Signed)
Recent decrease in platelet count to 140,000, very mild.  No history of low platelet count. likely reactive thrombocytopenia . For the work up of patient's thrombocytopenia, I recommend checking CBC;CMP, immature platelet fraction, LDH; smear review, folate, Vitamin B12, hepatitis, HIV, flowcytometry and monoclonal gammopathy workup.

## 2023-04-28 LAB — PROTEIN ELECTROPHORESIS, SERUM
A/G Ratio: 0.5 — ABNORMAL LOW (ref 0.7–1.7)
Albumin ELP: 2.7 g/dL — ABNORMAL LOW (ref 2.9–4.4)
Alpha-1-Globulin: 0.3 g/dL (ref 0.0–0.4)
Alpha-2-Globulin: 0.8 g/dL (ref 0.4–1.0)
Beta Globulin: 1.7 g/dL — ABNORMAL HIGH (ref 0.7–1.3)
Gamma Globulin: 2.7 g/dL — ABNORMAL HIGH (ref 0.4–1.8)
Globulin, Total: 5.5 g/dL — ABNORMAL HIGH (ref 2.2–3.9)
Total Protein ELP: 8.2 g/dL (ref 6.0–8.5)

## 2023-04-28 LAB — COMP PANEL: LEUKEMIA/LYMPHOMA

## 2023-05-07 ENCOUNTER — Other Ambulatory Visit: Payer: Self-pay | Admitting: Oncology

## 2023-05-07 DIAGNOSIS — E538 Deficiency of other specified B group vitamins: Secondary | ICD-10-CM | POA: Insufficient documentation

## 2023-05-08 ENCOUNTER — Telehealth: Payer: Self-pay

## 2023-05-08 NOTE — Telephone Encounter (Signed)
 Please schedule pt for MD and B12 inj, for discussion of lab work. Please notify pt of appt details.

## 2023-05-08 NOTE — Telephone Encounter (Signed)
-----   Message from Rickard Patience sent at 05/07/2023  2:29 PM EST ----- Please arrange him to see me back for discussion of results and B12 injection.

## 2023-05-12 ENCOUNTER — Telehealth: Payer: Self-pay | Admitting: Internal Medicine

## 2023-05-12 NOTE — Telephone Encounter (Signed)
 Lvm to move 05/19/23 appt-Amire

## 2023-05-14 ENCOUNTER — Encounter: Payer: Self-pay | Admitting: Oncology

## 2023-05-14 ENCOUNTER — Inpatient Hospital Stay: Payer: Medicare Other | Attending: Oncology | Admitting: Oncology

## 2023-05-14 ENCOUNTER — Inpatient Hospital Stay: Payer: Medicare Other

## 2023-05-14 VITALS — BP 125/84 | HR 92 | Temp 96.0°F | Resp 18 | Wt 199.0 lb

## 2023-05-14 DIAGNOSIS — D696 Thrombocytopenia, unspecified: Secondary | ICD-10-CM | POA: Insufficient documentation

## 2023-05-14 DIAGNOSIS — D649 Anemia, unspecified: Secondary | ICD-10-CM | POA: Diagnosis not present

## 2023-05-14 DIAGNOSIS — E538 Deficiency of other specified B group vitamins: Secondary | ICD-10-CM | POA: Diagnosis not present

## 2023-05-14 MED ORDER — B-12 2500 MCG SL SUBL: 1.0000 | SUBLINGUAL_TABLET | Freq: Every day | SUBLINGUAL | 0 refills | Status: AC

## 2023-05-14 MED ORDER — CYANOCOBALAMIN 1000 MCG/ML IJ SOLN
1000.0000 ug | Freq: Once | INTRAMUSCULAR | Status: AC
  Administered 2023-05-14: 1000 ug via INTRAMUSCULAR
  Filled 2023-05-14: qty 1

## 2023-05-14 NOTE — Assessment & Plan Note (Addendum)
 Thrombocytopenia has resolved.  Work up showed equivocal hepatitis A IgM will repeat.  Normal LDH; smear review, normal folate, low Vitamin B12,  negative HIV, negative  flowcytometry and negative monoclonal gammopathy

## 2023-05-14 NOTE — Progress Notes (Signed)
 Hematology/Oncology Progress note [phone] [phone]       REFERRING PROVIDER: Liana Fish, NP  CHIEF COMPLAINTS/REASON FOR VISIT:  B12 deficiency  ASSESSMENT & PLAN:   Thrombocytopenia (HCC) Thrombocytopenia has resolved.  Work up showed equivocal hepatitis A IgM will repeat.  Normal LDH; smear review, normal folate, low Vitamin B12,  negative HIV, negative  flowcytometry and negative monoclonal gammopathy   B12 deficiency She agrees IM b12 1000mcg today, declines additional injections.  Recommend Sublingual B12 2500mcg daily.  Will check intrinsic antibody and anti parietal antibody    Orders Placed This Encounter  Procedures   Hepatitis A Antibody, IGM    Standing Status:   Future    Number of Occurrences:   1    Expected Date:   05/14/2023    Expiration Date:   05/13/2024   Intrinsic Factor Antibodies    Standing Status:   Future    Number of Occurrences:   1    Expected Date:   05/14/2023    Expiration Date:   05/13/2024   Anti-parietal antibody    Standing Status:   Future    Number of Occurrences:   1    Expected Date:   05/14/2023    Expiration Date:   05/13/2024   Follow up 4 months All questions were answered. The patient knows to call the clinic with any problems, questions or concerns.  Zelphia Cap, MD, PhD [facility] Hematology Oncology 05/14/2023   HISTORY OF PRESENTING ILLNESS:  Tiffany Schaefer is a 73 y.o. female who was seen in consultation at the request of Liana Fish, NP for evaluation of thrombocytopenia   Patient had abnormal Labs which showed decreased platelet counts at  140,000  Reviewed patient's previous labs. Thrombocytopenia is new onset.  Denies weight loss, fever, chills, fatigue, night sweats.  Denies hematochezia, hematuria, hematemesis, epistaxis, black tarry stool.  Patient has no easy bruising.    Denies history hepatitis or HIV infection Denies history of chronic liver disease Denies routine  alcohol consumption. Denies dietary restrictions.  Denies herbal medication  INTERVAL HISTORY Tiffany Schaefer is a 73 y.o. female who has above history reviewed by me today presents for follow up visit for thrombocytopenia, and B12 deficiency.  + fatigue. No new complains.   MEDICAL HISTORY:  Past Medical History:  Diagnosis Date   Arthritis    Hyperlipidemia    Hypertension    Sinus infection    Sleep apnea     SURGICAL HISTORY: Past Surgical History:  Procedure Laterality Date   CESAREAN SECTION     denies 09/03/17   COLONOSCOPY     HEMORRHOID SURGERY     HYSTEROSCOPY WITH D & C N/A 09/11/2017   Procedure: DILATATION AND CURETTAGE /HYSTEROSCOPY;  Surgeon: Lake Read, MD;  Location: [facility] ORS;  Service: Gynecology;  Laterality: N/A;    SOCIAL HISTORY: Social History   Socioeconomic History   Marital status: Married    Spouse name: Not on file   Number of children: Not on file   Years of education: Not on file   Highest education level: Not on file  Occupational History   Not on file  Tobacco Use   Smoking status: Every Day    Current packs/day: 0.50    Average packs/day: 0.7 packs/day for 61.0 years (45.5 ttl pk-yrs)    Types: Cigarettes    Start date: 48   Smokeless tobacco: Never  Vaping Use   Vaping status: Never Used  Substance and  Sexual Activity   Alcohol use: Not Currently    Comment: occasionally   Drug use: No   Sexual activity: Not Currently    Birth control/protection: None  Other Topics Concern   Not on file  Social History Narrative   Not on file   Social Drivers of Health   Financial Resource Strain: Low Risk  (08/01/2020)   Overall Financial Resource Strain (CARDIA)    Difficulty of Paying Living Expenses: Not hard at all  Food Insecurity: No Food Insecurity (04/23/2023)   Hunger Vital Sign    Worried About Running Out of Food in the Last Year: Never true    Ran Out of Food in the Last Year: Never true  Transportation Needs: No  Transportation Needs (04/23/2023)   PRAPARE - Administrator, Civil Service (Medical): No    Lack of Transportation (Non-Medical): No  Physical Activity: Not on file  Stress: Not on file  Social Connections: Not on file  Intimate Partner Violence: Not At Risk (04/23/2023)   Humiliation, Afraid, Rape, and Kick questionnaire    Fear of Current or Ex-Partner: No    Emotionally Abused: No    Physically Abused: No    Sexually Abused: No    FAMILY HISTORY: Family History  Problem Relation Age of Onset   Stroke Mother    Cirrhosis Father    Diabetes Brother    Rheumatic fever Brother    Arthritis/Rheumatoid Brother     ALLERGIES:  has no known allergies.  MEDICATIONS:  Current Outpatient Medications  Medication Sig Dispense Refill   aspirin EC 81 MG tablet Take 81 mg by mouth daily with lunch.      carvedilol  (COREG ) 25 MG tablet TAKE 1 TABLET(25 MG) BY MOUTH DAILY WITH LUNCH 90 tablet 3   Cyanocobalamin  (B-12) 2500 MCG SUBL Place 1 tablet under the tongue daily. 90 tablet 0   furosemide  (LASIX ) 20 MG tablet TAKE 1 TABLET(20 MG) BY MOUTH DAILY 90 tablet 1   losartan -hydrochlorothiazide (HYZAAR) 100-25 MG tablet TAKE 1 TABLET BY MOUTH EVERY DAY WITH LUNCH 90 tablet 1   lovastatin  (MEVACOR ) 40 MG tablet Take 1 tablet (40 mg total) by mouth at bedtime. 90 tablet 3   potassium chloride  SA (KLOR-CON  M) 20 MEQ tablet TAKE 1 TABLET(20 MEQ) BY MOUTH twice DAILY 180 tablet 1   triamcinolone  cream (KENALOG ) 0.5 % Apply topically 2 (two) times daily. 454 g 1   Vitamin D , Ergocalciferol , (DRISDOL ) 1.25 MG (50000 UNIT) CAPS capsule TAKE 1 CAPSULE BY MOUTH EVERY 7 DAYS 12 capsule 1   albuterol  (VENTOLIN  HFA) 108 (90 Base) MCG/ACT inhaler Inhale 2 puffs into the lungs every 6 (six) hours as needed for wheezing or shortness of breath. (Patient not taking: Reported on 05/14/2023) 18 g 3   bisacodyl (DULCOLAX) 5 MG EC tablet Take 5 mg by mouth daily as needed for moderate constipation.  (Patient not taking: Reported on 05/14/2023)     cyclobenzaprine  (FLEXERIL ) 10 MG tablet Take 1 tablet (10 mg total) by mouth at bedtime as needed for muscle spasms. (Patient not taking: Reported on 05/14/2023) 30 tablet 0   No current facility-administered medications for this visit.    Review of Systems  Constitutional:  Negative for appetite change, chills, fatigue and fever.  HENT:   Negative for hearing loss and voice change.   Eyes:  Negative for eye problems.  Respiratory:  Negative for chest tightness and cough.   Cardiovascular:  Negative for chest pain.  Gastrointestinal:  Negative for abdominal distention, abdominal pain and blood in stool.  Endocrine: Negative for hot flashes.  Genitourinary:  Negative for difficulty urinating and frequency.   Musculoskeletal:  Negative for arthralgias.  Skin:  Negative for itching and rash.  Neurological:  Negative for extremity weakness.  Hematological:  Negative for adenopathy.  Psychiatric/Behavioral:  Negative for confusion.     PHYSICAL EXAMINATION: ECOG PERFORMANCE STATUS: 0 - Asymptomatic Vitals:   05/14/23 1054 05/14/23 1102  BP: (!) 154/89 125/84  Pulse: 92   Resp: 18   Temp: (!) 96 F (35.6 C)    Filed Weights   05/14/23 1054  Weight: 199 lb (90.3 kg)    Physical Exam Constitutional:      General: She is not in acute distress. HENT:     Head: Normocephalic and atraumatic.  Eyes:     General: No scleral icterus. Cardiovascular:     Rate and Rhythm: Normal rate.  Pulmonary:     Effort: Pulmonary effort is normal. No respiratory distress.  Abdominal:     General: There is no distension.  Musculoskeletal:        General: Normal range of motion.     Cervical back: Normal range of motion and neck supple.  Skin:    General: Skin is warm and dry.     Findings: No erythema or rash.  Neurological:     Mental Status: She is alert and oriented to person, place, and time. Mental status is at baseline.  Psychiatric:         Mood and Affect: Mood normal.      LABORATORY DATA:  I have reviewed the data as listed    Latest Ref Rng & Units 04/23/2023    9:59 AM 03/05/2023   10:01 AM 09/09/2022   10:15 AM  CBC  WBC 4.0 - 10.5 K/uL 5.2  5.4  6.0   Hemoglobin 12.0 - 15.0 g/dL 88.5  84.5  88.6   Hematocrit 36.0 - 46.0 % 35.4  49.1  34.9   Platelets 150 - 400 K/uL 184  140  184       Latest Ref Rng & Units 03/05/2023   10:01 AM 09/09/2022   10:15 AM 03/18/2022    1:07 PM  CMP  Glucose 70 - 99 mg/dL 91  94  86   BUN 8 - 27 mg/dL 20  18  20    Creatinine 0.57 - 1.00 mg/dL 9.08  9.00  9.06   Sodium 134 - 144 mmol/L 137  138  139   Potassium 3.5 - 5.2 mmol/L 3.3  3.3  3.6   Chloride 96 - 106 mmol/L 98  100  100   CO2 20 - 29 mmol/L 26  24  23    Calcium 8.7 - 10.3 mg/dL 9.1  8.8  8.8   Total Protein 6.0 - 8.5 g/dL 8.6  8.4  8.8   Total Bilirubin 0.0 - 1.2 mg/dL 0.4  0.3  0.3   Alkaline Phos 44 - 121 IU/L 50  48  47   AST 0 - 40 IU/L 25  21  26    ALT 0 - 32 IU/L 13  12  13       RADIOGRAPHIC STUDIES: I have personally reviewed the radiological images as listed and agreed with the findings in the report.  No results found.

## 2023-05-14 NOTE — Assessment & Plan Note (Signed)
 Possible due to B12 deficiency  Repeat cbc in 3-4 months.

## 2023-05-14 NOTE — Assessment & Plan Note (Signed)
 She agrees IM b12 today, declines additional injections.  Recommend Sublingual B12 daily.  Will check intrinsic antibody and anti parietal antibody

## 2023-05-15 LAB — INTRINSIC FACTOR ANTIBODIES: Intrinsic Factor: 15 [AU]/ml — ABNORMAL HIGH (ref 0.0–1.1)

## 2023-05-15 LAB — ANTI-PARIETAL ANTIBODY: Parietal Cell Antibody-IgG: 1.2 U (ref 0.0–20.0)

## 2023-05-15 LAB — HEPATITIS A ANTIBODY, IGM: Hep A IgM: UNDETERMINED — AB

## 2023-05-19 ENCOUNTER — Ambulatory Visit: Payer: Medicare Other | Admitting: Internal Medicine

## 2023-05-20 ENCOUNTER — Telehealth: Payer: Self-pay

## 2023-05-20 DIAGNOSIS — D510 Vitamin B12 deficiency anemia due to intrinsic factor deficiency: Secondary | ICD-10-CM

## 2023-05-20 NOTE — Telephone Encounter (Signed)
 Message left on pt's VM and mychart message sent.   Referral to GI entered in EPIC.

## 2023-05-20 NOTE — Telephone Encounter (Signed)
-----   Message from Rickard Patience sent at 05/20/2023 11:09 AM EST ----- Repeat Hep A IgM is equivocal again. Positive intrinsic factor, indicating that she may have pernicious anemia.  Please refer her to GI for further evaluation.

## 2023-06-10 ENCOUNTER — Ambulatory Visit (INDEPENDENT_AMBULATORY_CARE_PROVIDER_SITE_OTHER): Payer: Medicare Other | Admitting: Internal Medicine

## 2023-06-10 ENCOUNTER — Encounter: Payer: Self-pay | Admitting: Internal Medicine

## 2023-06-10 VITALS — BP 118/70 | HR 82 | Temp 97.8°F | Resp 16 | Ht 63.0 in | Wt 195.0 lb

## 2023-06-10 DIAGNOSIS — D696 Thrombocytopenia, unspecified: Secondary | ICD-10-CM

## 2023-06-10 DIAGNOSIS — I10 Essential (primary) hypertension: Secondary | ICD-10-CM | POA: Diagnosis not present

## 2023-06-10 DIAGNOSIS — F17210 Nicotine dependence, cigarettes, uncomplicated: Secondary | ICD-10-CM | POA: Diagnosis not present

## 2023-06-10 DIAGNOSIS — B509 Plasmodium falciparum malaria, unspecified: Secondary | ICD-10-CM | POA: Diagnosis not present

## 2023-06-10 NOTE — Progress Notes (Signed)
 [facility] [address] [place], [place] [address]  Internal MEDICINE  Office Visit Note  Patient Name: Tiffany Schaefer  [id number]  [id number]  Date of Service: 06/18/2023  Chief Complaint  Patient presents with   Follow-up   Hypertension    HPI Pt is seen for follow up She has been seen by hematology for thrombocytopenia, has dx of pernicious anemia  Scheduled to see GI for EGD, dos c/o decreased appetite/ early satiety  Does qualify for lung cancer screening but wants to wait until next visit  Blood pressure is under better control     Current Medication: Outpatient Encounter Medications as of 06/10/2023  Medication Sig   albuterol  (VENTOLIN  HFA) 108 (90 Base) MCG/ACT inhaler Inhale 2 puffs into the lungs every 6 (six) hours as needed for wheezing or shortness of breath.   aspirin EC 81 MG tablet Take 81 mg by mouth daily with lunch.    bisacodyl (DULCOLAX) 5 MG EC tablet Take 5 mg by mouth daily as needed for moderate constipation.   carvedilol  (COREG ) 25 MG tablet TAKE 1 TABLET(25 MG) BY MOUTH DAILY WITH LUNCH   Cyanocobalamin  (B-12) 2500 MCG SUBL Place 1 tablet under the tongue daily.   furosemide  (LASIX ) 20 MG tablet TAKE 1 TABLET(20 MG) BY MOUTH DAILY   losartan -hydrochlorothiazide (HYZAAR) 100-25 MG tablet TAKE 1 TABLET BY MOUTH EVERY DAY WITH LUNCH   lovastatin  (MEVACOR ) 40 MG tablet Take 1 tablet (40 mg total) by mouth at bedtime.   potassium chloride  SA (KLOR-CON  M) 20 MEQ tablet TAKE 1 TABLET(20 MEQ) BY MOUTH twice DAILY   triamcinolone  cream (KENALOG ) 0.5 % Apply topically 2 (two) times daily.   [DISCONTINUED] Vitamin D , Ergocalciferol , (DRISDOL ) 1.25 MG (50000 UNIT) CAPS capsule TAKE 1 CAPSULE BY MOUTH EVERY 7 DAYS   [DISCONTINUED] cyclobenzaprine  (FLEXERIL ) 10 MG tablet Take 1 tablet (10 mg total) by mouth at bedtime as needed for muscle spasms. (Patient not taking: Reported on 04/23/2023)   No facility-administered encounter medications on file as of  06/10/2023.    Surgical History: Past Surgical History:  Procedure Laterality Date   CESAREAN SECTION     denies 09/03/17   COLONOSCOPY     HEMORRHOID SURGERY     HYSTEROSCOPY WITH D & C N/A 09/11/2017   Procedure: DILATATION AND CURETTAGE /HYSTEROSCOPY;  Surgeon: Lake Read, MD;  Location: [facility] ORS;  Service: Gynecology;  Laterality: N/A;    Medical History: Past Medical History:  Diagnosis Date   Arthritis    Hyperlipidemia    Hypertension    Sinus infection    Sleep apnea     Family History: Family History  Problem Relation Age of Onset   Stroke Mother    Cirrhosis Father    Diabetes Brother    Rheumatic fever Brother    Arthritis/Rheumatoid Brother     Social History   Socioeconomic History   Marital status: Married    Spouse name: Not on file   Number of children: Not on file   Years of education: Not on file   Highest education level: Not on file  Occupational History   Not on file  Tobacco Use   Smoking status: Every Day    Current packs/day: 0.50    Average packs/day: 0.7 packs/day for 61.1 years (45.6 ttl pk-yrs)    Types: Cigarettes    Start date: 81   Smokeless tobacco: Never  Vaping Use   Vaping status: Never Used  Substance and Sexual Activity  Alcohol use: Not Currently    Comment: occasionally   Drug use: No   Sexual activity: Not Currently    Birth control/protection: None  Other Topics Concern   Not on file  Social History Narrative   Not on file   Social Drivers of Health   Financial Resource Strain: Low Risk  (08/01/2020)   Overall Financial Resource Strain (CARDIA)    Difficulty of Paying Living Expenses: Not hard at all  Food Insecurity: No Food Insecurity (04/23/2023)   Hunger Vital Sign    Worried About Running Out of Food in the Last Year: Never true    Ran Out of Food in the Last Year: Never true  Transportation Needs: No Transportation Needs (04/23/2023)   PRAPARE - Administrator, Civil Service  (Medical): No    Lack of Transportation (Non-Medical): No  Physical Activity: Not on file  Stress: Not on file  Social Connections: Not on file  Intimate Partner Violence: Not At Risk (04/23/2023)   Humiliation, Afraid, Rape, and Kick questionnaire    Fear of Current or Ex-Partner: No    Emotionally Abused: No    Physically Abused: No    Sexually Abused: No      Review of Systems  Constitutional:  Negative for chills, fatigue and unexpected weight change.  HENT:  Negative for congestion, postnasal drip, rhinorrhea, sneezing and sore throat.   Eyes:  Negative for redness.  Respiratory:  Negative for cough, chest tightness and shortness of breath.   Cardiovascular:  Negative for chest pain and palpitations.  Gastrointestinal:  Negative for abdominal pain, constipation, diarrhea, nausea and vomiting.  Genitourinary:  Negative for dysuria and frequency.  Musculoskeletal:  Negative for arthralgias, back pain, joint swelling and neck pain.  Skin:  Negative for rash.  Neurological: Negative.  Negative for tremors and numbness.  Hematological:  Negative for adenopathy. Does not bruise/bleed easily.  Psychiatric/Behavioral:  Negative for behavioral problems (Depression), sleep disturbance and suicidal ideas. The patient is not nervous/anxious.     Vital Signs: BP 118/70   Pulse 82   Temp 97.8 F (36.6 C)   Resp 16   Ht 5' 3 (1.6 m)   Wt 195 lb (88.5 kg)   SpO2 96%   BMI 34.54 kg/m    Physical Exam Constitutional:      Appearance: Normal appearance.  HENT:     Head: Normocephalic and atraumatic.     Nose: Nose normal.     Mouth/Throat:     Mouth: Mucous membranes are moist.     Pharynx: No posterior oropharyngeal erythema.  Eyes:     Extraocular Movements: Extraocular movements intact.     Pupils: Pupils are equal, round, and reactive to light.  Cardiovascular:     Pulses: Normal pulses.     Heart sounds: Normal heart sounds.  Pulmonary:     Effort: Pulmonary effort  is normal.     Breath sounds: Normal breath sounds.  Neurological:     General: No focal deficit present.     Mental Status: She is alert.  Psychiatric:        Mood and Affect: Mood normal.        Behavior: Behavior normal.        Assessment/Plan: 1. Benign hypertension (Primary) Controlled, will continue to monitor, will need potassium level repeated   2. Thrombocytopenia (HCC) Followed by hematology  3. 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 I have discussed any further diagnostic evaluation that may be needed or ordered today. We also reviewed her medications today. she has been encouraged to call the office with any questions or concerns that should arise related to todays visit.    No orders of the defined types were placed in this encounter.   No orders of the defined types were placed in this encounter.   Total time spent:45 Minutes Time spent includes review of chart, medications, test results, and follow up plan with the patient.   [place] Controlled Substance Database was reviewed by me.   Dr Ahad Colarusso M [facility]

## 2023-07-09 ENCOUNTER — Encounter: Payer: Self-pay | Admitting: Oncology

## 2023-07-24 ENCOUNTER — Ambulatory Visit: Payer: Medicare Other | Admitting: Gastroenterology

## 2023-07-24 ENCOUNTER — Encounter: Payer: Self-pay | Admitting: Gastroenterology

## 2023-07-24 VITALS — BP 125/79 | HR 88 | Temp 97.9°F | Ht 63.0 in | Wt 197.1 lb

## 2023-07-24 DIAGNOSIS — D510 Vitamin B12 deficiency anemia due to intrinsic factor deficiency: Secondary | ICD-10-CM

## 2023-07-24 NOTE — Progress Notes (Signed)
 Tiffany Repress, MD [address]  [address]  [place], [place] [address]  Main: [phone]  Fax: [phone]    Gastroenterology Consultation  Referring Provider:     Sallyanne Kuster, NP Primary Care Physician:  Sallyanne Kuster, NP Primary Gastroenterologist:  Dr. Arlyss Schaefer Reason for Consultation: Pernicious anemia        HPI:   DAILYNN Schaefer is a 73 y.o. female referred by Sallyanne Kuster, NP  for consultation & management of new diagnosis of pernicious anemia.  She was initially evaluated by Dr. Cathie Hoops for new diagnosis of B12 deficiency anemia, found to have intrinsic factor antibodies and negative antiparietal cell antibodies.  No evidence of iron deficiency.  She was recommended to take B12 shots, however she took only single B12 shot and preferred to take oral B12 supplements.  Patient also reports that she was experiencing upper abdominal discomfort, bloating and she increased her water intake which resulted in moving her bowels regularly.  She is no longer experiencing GI symptoms.  Patient is accompanied by her daughter today who expressed concern regarding her upper GI symptoms which were ongoing last year which has resulted in weight loss.  However, her weight loss has plateaued.  She underwent workup by Dr. Cathie Hoops.  No other etiology was identified  Patient reports undergoing stool based colon cancer screening test and have been negative so far  She does not smoke or drink alcohol  NSAIDs: None  Antiplts/Anticoagulants/Anti thrombotics: None  GI Procedures: Reports undergoing colonoscopy more than 10 years ago  Past Medical History:  Diagnosis Date   Arthritis    Hyperlipidemia    Hypertension    Sinus infection    Sleep apnea     Past Surgical History:  Procedure Laterality Date   CESAREAN SECTION     denies 09/03/17   COLONOSCOPY     HEMORRHOID SURGERY     HYSTEROSCOPY WITH D & C N/A 09/11/2017   Procedure: DILATATION AND CURETTAGE /HYSTEROSCOPY;   Surgeon: Vena Austria, MD;  Location: [facility] ORS;  Service: Gynecology;  Laterality: N/A;     Current Outpatient Medications:    albuterol (VENTOLIN HFA) 108 (90 Base) MCG/ACT inhaler, Inhale 2 puffs into the lungs every 6 (six) hours as needed for wheezing or shortness of breath., Disp: 18 g, Rfl: 3   bisacodyl (DULCOLAX) 5 MG EC tablet, Take 5 mg by mouth daily as needed for moderate constipation., Disp: , Rfl:    carvedilol (COREG) 25 MG tablet, TAKE 1 TABLET(25 MG) BY MOUTH DAILY WITH LUNCH, Disp: 90 tablet, Rfl: 3   Cyanocobalamin (B-12) 2500 MCG SUBL, Place 1 tablet under the tongue daily., Disp: 90 tablet, Rfl: 0   furosemide (LASIX) 20 MG tablet, TAKE 1 TABLET(20 MG) BY MOUTH DAILY, Disp: 90 tablet, Rfl: 1   losartan-hydrochlorothiazide (HYZAAR) 100-25 MG tablet, TAKE 1 TABLET BY MOUTH EVERY DAY WITH LUNCH, Disp: 90 tablet, Rfl: 1   lovastatin (MEVACOR) 40 MG tablet, Take 1 tablet (40 mg total) by mouth at bedtime., Disp: 90 tablet, Rfl: 3   potassium chloride SA (KLOR-CON M) 20 MEQ tablet, TAKE 1 TABLET(20 MEQ) BY MOUTH twice DAILY, Disp: 180 tablet, Rfl: 1   triamcinolone cream (KENALOG) 0.5 %, Apply topically 2 (two) times daily., Disp: 454 g, Rfl: 1   aspirin EC 81 MG tablet, Take 81 mg by mouth daily with lunch.  (Patient not taking: Reported on 07/24/2023), Disp: , Rfl:    Family History  Problem Relation Age  of Onset   Stroke Mother    Cirrhosis Father    Diabetes Brother    Rheumatic fever Brother    Arthritis/Rheumatoid Brother      Social History   Tobacco Use   Smoking status: Every Day    Current packs/day: 0.50    Average packs/day: 0.7 packs/day for 61.2 years (45.6 ttl pk-yrs)    Types: Cigarettes    Start date: 1994   Smokeless tobacco: Never  Vaping Use   Vaping status: Never Used  Substance Use Topics   Alcohol use: Not Currently    Comment: occasionally   Drug use: No    Allergies as of 07/24/2023   (No Known Allergies)    Review of Systems:     All systems reviewed and negative except where noted in HPI.   Physical Exam:  BP 125/79 (BP Location: Right Arm, Patient Position: Sitting, Cuff Size: Large)   Pulse 88   Temp 97.9 F (36.6 C) (Oral)   Ht 5\' 3"  (1.6 m)   Wt 197 lb 2 oz (89.4 kg)   BMI 34.92 kg/m  No LMP recorded. Patient is postmenopausal.  General:   Alert,  Well-developed, well-nourished, pleasant and cooperative in NAD Head:  Normocephalic and atraumatic. Eyes:  Sclera clear, no icterus.   Conjunctiva pink. Ears:  Normal auditory acuity. Nose:  No deformity, discharge, or lesions. Mouth:  No deformity or lesions,oropharynx pink & moist. Neck:  Supple; no masses or thyromegaly. Lungs:  Respirations even and unlabored.  Clear throughout to auscultation.   No wheezes, crackles, or rhonchi. No acute distress. Heart:  Regular rate and rhythm; no murmurs, clicks, rubs, or gallops. Abdomen:  Normal bowel sounds. Soft, obese, non-tender and non-distended without masses, hepatosplenomegaly or hernias noted.  No guarding or rebound tenderness.   Rectal: Not performed Msk:  Symmetrical without gross deformities. Good, equal movement & strength bilaterally. Pulses:  Normal pulses noted. Extremities:  No clubbing or edema.  No cyanosis. Neurologic:  Alert and oriented x3;  grossly normal neurologically. Skin:  Intact without significant lesions or rashes. No jaundice. Psych:  Alert and cooperative. Normal mood and affect.  Imaging Studies: Reviewed  Assessment and Plan:   Tiffany Schaefer is a 73 y.o. pleasant African-American female with obesity, hypertension is seen in consultation for pernicious anemia manifesting as B12 deficiency.  No evidence of iron deficiency Discussed with patient regarding upper endoscopy for evaluation of intestinal metaplasia.  Patient is reluctant to undergo at this time because she does not have any GI symptoms Recommended to continue B12 replacement therapy and if her GI symptoms recur, she  should contact us and we can schedule upper endoscopy She would like to continue stool based colon cancer screening tests at this time   Follow up as needed   Tiffany Repress, MD

## 2023-07-31 ENCOUNTER — Other Ambulatory Visit: Payer: Self-pay | Admitting: Nurse Practitioner

## 2023-07-31 DIAGNOSIS — I10 Essential (primary) hypertension: Secondary | ICD-10-CM

## 2023-08-18 ENCOUNTER — Other Ambulatory Visit: Payer: Self-pay

## 2023-08-18 DIAGNOSIS — E876 Hypokalemia: Secondary | ICD-10-CM

## 2023-08-18 DIAGNOSIS — I10 Essential (primary) hypertension: Secondary | ICD-10-CM

## 2023-08-18 MED ORDER — POTASSIUM CHLORIDE CRYS ER 20 MEQ PO TBCR: EXTENDED_RELEASE_TABLET | ORAL | 0 refills | Status: DC

## 2023-08-25 ENCOUNTER — Other Ambulatory Visit: Payer: Self-pay | Admitting: Nurse Practitioner

## 2023-08-25 DIAGNOSIS — I10 Essential (primary) hypertension: Secondary | ICD-10-CM

## 2023-08-28 DIAGNOSIS — D37030 Neoplasm of uncertain behavior of the parotid salivary glands: Secondary | ICD-10-CM | POA: Diagnosis not present

## 2023-08-28 DIAGNOSIS — H60543 Acute eczematoid otitis externa, bilateral: Secondary | ICD-10-CM | POA: Diagnosis not present

## 2023-09-05 ENCOUNTER — Other Ambulatory Visit: Payer: Self-pay

## 2023-09-05 DIAGNOSIS — D510 Vitamin B12 deficiency anemia due to intrinsic factor deficiency: Secondary | ICD-10-CM

## 2023-09-05 DIAGNOSIS — E538 Deficiency of other specified B group vitamins: Secondary | ICD-10-CM

## 2023-09-07 ENCOUNTER — Encounter: Payer: Self-pay | Admitting: Oncology

## 2023-09-08 ENCOUNTER — Encounter: Payer: Self-pay | Admitting: Oncology

## 2023-09-08 ENCOUNTER — Inpatient Hospital Stay: Payer: Medicare Other | Attending: Oncology

## 2023-09-08 DIAGNOSIS — D510 Vitamin B12 deficiency anemia due to intrinsic factor deficiency: Secondary | ICD-10-CM | POA: Insufficient documentation

## 2023-09-08 DIAGNOSIS — E538 Deficiency of other specified B group vitamins: Secondary | ICD-10-CM

## 2023-09-08 DIAGNOSIS — Z7982 Long term (current) use of aspirin: Secondary | ICD-10-CM | POA: Diagnosis not present

## 2023-09-08 DIAGNOSIS — D696 Thrombocytopenia, unspecified: Secondary | ICD-10-CM | POA: Diagnosis not present

## 2023-09-08 DIAGNOSIS — Z79899 Other long term (current) drug therapy: Secondary | ICD-10-CM | POA: Insufficient documentation

## 2023-09-08 DIAGNOSIS — F17210 Nicotine dependence, cigarettes, uncomplicated: Secondary | ICD-10-CM | POA: Insufficient documentation

## 2023-09-08 LAB — CBC WITH DIFFERENTIAL (CANCER CENTER ONLY)
Abs Immature Granulocytes: 0.01 10*3/uL (ref 0.00–0.07)
Basophils Absolute: 0 10*3/uL (ref 0.0–0.1)
Basophils Relative: 0 %
Eosinophils Absolute: 0.1 10*3/uL (ref 0.0–0.5)
Eosinophils Relative: 2 %
HCT: 34.9 % — ABNORMAL LOW (ref 36.0–46.0)
Hemoglobin: 11.2 g/dL — ABNORMAL LOW (ref 12.0–15.0)
Immature Granulocytes: 0 %
Lymphocytes Relative: 39 %
Lymphs Abs: 2.3 10*3/uL (ref 0.7–4.0)
MCH: 27.8 pg (ref 26.0–34.0)
MCHC: 32.1 g/dL (ref 30.0–36.0)
MCV: 86.6 fL (ref 80.0–100.0)
Monocytes Absolute: 0.6 10*3/uL (ref 0.1–1.0)
Monocytes Relative: 10 %
Neutro Abs: 2.9 10*3/uL (ref 1.7–7.7)
Neutrophils Relative %: 49 %
Platelet Count: 179 10*3/uL (ref 150–400)
RBC: 4.03 MIL/uL (ref 3.87–5.11)
RDW: 17.2 % — ABNORMAL HIGH (ref 11.5–15.5)
WBC Count: 6 10*3/uL (ref 4.0–10.5)
nRBC: 0 % (ref 0.0–0.2)

## 2023-09-08 LAB — VITAMIN B12: Vitamin B-12: 4256 pg/mL — ABNORMAL HIGH (ref 180–914)

## 2023-09-10 ENCOUNTER — Inpatient Hospital Stay

## 2023-09-10 ENCOUNTER — Encounter: Payer: Self-pay | Admitting: Oncology

## 2023-09-10 ENCOUNTER — Inpatient Hospital Stay: Payer: Medicare Other

## 2023-09-10 ENCOUNTER — Inpatient Hospital Stay: Payer: Medicare Other | Admitting: Oncology

## 2023-09-10 VITALS — BP 131/75 | Temp 97.6°F | Resp 17 | Wt 196.0 lb

## 2023-09-10 DIAGNOSIS — Z79899 Other long term (current) drug therapy: Secondary | ICD-10-CM | POA: Diagnosis not present

## 2023-09-10 DIAGNOSIS — E538 Deficiency of other specified B group vitamins: Secondary | ICD-10-CM

## 2023-09-10 DIAGNOSIS — F17210 Nicotine dependence, cigarettes, uncomplicated: Secondary | ICD-10-CM | POA: Diagnosis not present

## 2023-09-10 DIAGNOSIS — D510 Vitamin B12 deficiency anemia due to intrinsic factor deficiency: Secondary | ICD-10-CM

## 2023-09-10 DIAGNOSIS — Z7982 Long term (current) use of aspirin: Secondary | ICD-10-CM | POA: Diagnosis not present

## 2023-09-10 DIAGNOSIS — B159 Hepatitis A without hepatic coma: Secondary | ICD-10-CM

## 2023-09-10 DIAGNOSIS — D649 Anemia, unspecified: Secondary | ICD-10-CM | POA: Diagnosis not present

## 2023-09-10 DIAGNOSIS — D696 Thrombocytopenia, unspecified: Secondary | ICD-10-CM | POA: Diagnosis not present

## 2023-09-10 LAB — HEPATITIS A ANTIBODY, TOTAL: hep A Total Ab: REACTIVE — AB

## 2023-09-10 NOTE — Progress Notes (Signed)
 No B12 injection needed today

## 2023-09-10 NOTE — Assessment & Plan Note (Addendum)
 She was seen by GI and declined upper endoscopy for evaluation of intestinal metaplasia.

## 2023-09-10 NOTE — Progress Notes (Signed)
 Hematology/Oncology Progress note [phone] [phone]       REFERRING PROVIDER: Laurence Pons, NP  CHIEF COMPLAINTS/REASON FOR VISIT:  B12 deficiency  ASSESSMENT & PLAN:   B12 deficiency She got one dose of IM b12 1000mcg today, declines additional injections.  B12 level has improved with sublingual B12 2500mcg daily.  B12 in 4000s. Recommend patient to decrease b12 supplementation to once per week.  Positive intrinsic factor antibody.   Pernicious anemia She was seen by GI and declined upper endoscopy for evaluation of intestinal metaplasia.  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 Baldomero Bone and she recommends checking hepatitis A IgG.    Orders Placed This Encounter  Procedures   Hepatitis A Antibody, Total    Standing Status:   Future    Number of Occurrences:   1    Expected Date:   09/10/2023    Expiration Date:   09/09/2024   CBC with Differential (Cancer Center Only)    Standing Status:   Future    Expected Date:   03/12/2024    Expiration Date:   09/09/2024   Vitamin B12    Standing Status:   Future    Expected Date:   03/12/2024    Expiration Date:   09/09/2024   Follow up 6 months All questions were answered. The patient knows to call the clinic with any problems, questions or concerns.  Timmy Forbes, MD, PhD [facility] Hematology Oncology 09/10/2023   HISTORY OF PRESENTING ILLNESS:  Tiffany Schaefer is a 73 y.o. female who was seen in consultation at the request of Laurence Pons, NP for evaluation of thrombocytopenia   Patient had abnormal Labs which showed decreased platelet counts at  140,000  Reviewed patient's previous labs. Thrombocytopenia is new onset.  Denies weight loss, fever, chills, fatigue, night sweats.  Denies hematochezia, hematuria, hematemesis, epistaxis, black tarry stool.  Patient has no easy bruising.    Denies history hepatitis or HIV infection Denies history of chronic liver  disease Denies routine alcohol consumption. Denies dietary restrictions.  Denies herbal medication  INTERVAL HISTORY Tiffany Schaefer is a 73 y.o. female who has above history reviewed by me today presents for follow up visit for thrombocytopenia, and B12 deficiency.  She takes sublingual B12 daily.  Seen by GI.  No new complains.   MEDICAL HISTORY:  Past Medical History:  Diagnosis Date   Arthritis    Hyperlipidemia    Hypertension    Sinus infection    Sleep apnea     SURGICAL HISTORY: Past Surgical History:  Procedure Laterality Date   CESAREAN SECTION     denies 09/03/17   COLONOSCOPY     HEMORRHOID SURGERY     HYSTEROSCOPY WITH D & C N/A 09/11/2017   Procedure: DILATATION AND CURETTAGE /HYSTEROSCOPY;  Surgeon: Darl Edu, MD;  Location: [facility] ORS;  Service: Gynecology;  Laterality: N/A;    SOCIAL HISTORY: Social History   Socioeconomic History   Marital status: Married    Spouse name: Not on file   Number of children: Not on file   Years of education: Not on file   Highest education level: Not on file  Occupational History   Not on file  Tobacco Use   Smoking status: Every Day    Current packs/day: 0.50    Average packs/day: 0.7 packs/day for 61.3 years (45.7 ttl pk-yrs)    Types: Cigarettes    Start date: 15   Smokeless  tobacco: Never  Vaping Use   Vaping status: Never Used  Substance and Sexual Activity   Alcohol use: Not Currently    Comment: occasionally   Drug use: No   Sexual activity: Not Currently    Birth control/protection: None  Other Topics Concern   Not on file  Social History Narrative   Not on file   Social Drivers of Health   Financial Resource Strain: Low Risk  (08/01/2020)   Overall Financial Resource Strain (CARDIA)    Difficulty of Paying Living Expenses: Not hard at all  Food Insecurity: No Food Insecurity (04/23/2023)   Hunger Vital Sign    Worried About Running Out of Food in the Last Year: Never true    Ran Out of Food  in the Last Year: Never true  Transportation Needs: No Transportation Needs (04/23/2023)   PRAPARE - Administrator, Civil Service (Medical): No    Lack of Transportation (Non-Medical): No  Physical Activity: Not on file  Stress: Not on file  Social Connections: Not on file  Intimate Partner Violence: Not At Risk (04/23/2023)   Humiliation, Afraid, Rape, and Kick questionnaire    Fear of Current or Ex-Partner: No    Emotionally Abused: No    Physically Abused: No    Sexually Abused: No    FAMILY HISTORY: Family History  Problem Relation Age of Onset   Stroke Mother    Cirrhosis Father    Diabetes Brother    Rheumatic fever Brother    Arthritis/Rheumatoid Brother     ALLERGIES:  has no known allergies.  MEDICATIONS:  Current Outpatient Medications  Medication Sig Dispense Refill   albuterol  (VENTOLIN  HFA) 108 (90 Base) MCG/ACT inhaler Inhale 2 puffs into the lungs every 6 (six) hours as needed for wheezing or shortness of breath. 18 g 3   bisacodyl (DULCOLAX) 5 MG EC tablet Take 5 mg by mouth daily as needed for moderate constipation.     carvedilol  (COREG ) 25 MG tablet TAKE 1 TABLET(25 MG) BY MOUTH DAILY WITH LUNCH 90 tablet 3   Cyanocobalamin  (B-12) 2500 MCG SUBL Place 1 tablet under the tongue daily. 90 tablet 0   furosemide  (LASIX ) 20 MG tablet TAKE 1 TABLET(20 MG) BY MOUTH DAILY 90 tablet 1   losartan -hydrochlorothiazide (HYZAAR) 100-25 MG tablet TAKE 1 TABLET BY MOUTH EVERY DAY WITH LUNCH 90 tablet 1   lovastatin  (MEVACOR ) 40 MG tablet Take 1 tablet (40 mg total) by mouth at bedtime. 90 tablet 3   potassium chloride  SA (KLOR-CON  M) 20 MEQ tablet TAKE 1 TABLET(20 MEQ) BY MOUTH daily 90 tablet 0   triamcinolone  cream (KENALOG ) 0.5 % Apply topically 2 (two) times daily. 454 g 1   aspirin EC 81 MG tablet Take 81 mg by mouth daily with lunch.  (Patient not taking: Reported on 07/24/2023)     No current facility-administered medications for this visit.    Review  of Systems  Constitutional:  Negative for appetite change, chills, fatigue and fever.  HENT:   Negative for hearing loss and voice change.   Eyes:  Negative for eye problems.  Respiratory:  Negative for chest tightness and cough.   Cardiovascular:  Negative for chest pain.  Gastrointestinal:  Negative for abdominal distention, abdominal pain and blood in stool.  Endocrine: Negative for hot flashes.  Genitourinary:  Negative for difficulty urinating and frequency.   Musculoskeletal:  Negative for arthralgias.  Skin:  Negative for itching and rash.  Neurological:  Negative for extremity  weakness.  Hematological:  Negative for adenopathy.  Psychiatric/Behavioral:  Negative for confusion.     PHYSICAL EXAMINATION: ECOG PERFORMANCE STATUS: 0 - Asymptomatic Vitals:   09/10/23 1050  BP: 131/75  Resp: 17  Temp: 97.6 F (36.4 C)  SpO2: 100%   Filed Weights   09/10/23 1050  Weight: 196 lb (88.9 kg)    Physical Exam Constitutional:      General: She is not in acute distress. HENT:     Head: Normocephalic and atraumatic.  Eyes:     General: No scleral icterus. Cardiovascular:     Rate and Rhythm: Normal rate.  Pulmonary:     Effort: Pulmonary effort is normal. No respiratory distress.  Abdominal:     General: There is no distension.  Musculoskeletal:        General: Normal range of motion.     Cervical back: Normal range of motion and neck supple.  Skin:    General: Skin is warm and dry.     Findings: No erythema or rash.  Neurological:     Mental Status: She is alert and oriented to person, place, and time. Mental status is at baseline.  Psychiatric:        Mood and Affect: Mood normal.      LABORATORY DATA:  I have reviewed the data as listed    Latest Ref Rng & Units 09/08/2023   10:50 AM 04/23/2023    9:59 AM 03/05/2023   10:01 AM  CBC  WBC 4.0 - 10.5 K/uL 6.0  5.2  5.4   Hemoglobin 12.0 - 15.0 g/dL 63.8  75.6  43.3   Hematocrit 36.0 - 46.0 % 34.9  35.4  49.1    Platelets 150 - 400 K/uL 179  184  140       Latest Ref Rng & Units 03/05/2023   10:01 AM 09/09/2022   10:15 AM 03/18/2022    1:07 PM  CMP  Glucose 70 - 99 mg/dL 91  94  86   BUN 8 - 27 mg/dL 20  18  20    Creatinine 0.57 - 1.00 mg/dL 2.95  1.88  4.16   Sodium 134 - 144 mmol/L 137  138  139   Potassium 3.5 - 5.2 mmol/L 3.3  3.3  3.6   Chloride 96 - 106 mmol/L 98  100  100   CO2 20 - 29 mmol/L 26  24  23    Calcium 8.7 - 10.3 mg/dL 9.1  8.8  8.8   Total Protein 6.0 - 8.5 g/dL 8.6  8.4  8.8   Total Bilirubin 0.0 - 1.2 mg/dL 0.4  0.3  0.3   Alkaline Phos 44 - 121 IU/L 50  48  47   AST 0 - 40 IU/L 25  21  26    ALT 0 - 32 IU/L 13  12  13       RADIOGRAPHIC STUDIES: I have personally reviewed the radiological images as listed and agreed with the findings in the report.  No results found.

## 2023-09-10 NOTE — Assessment & Plan Note (Signed)
 Equivalent hepatitis A x 2  Discussed with GI Dr. Baldomero Bone and she recommends checking hepatitis A IgG.

## 2023-09-10 NOTE — Assessment & Plan Note (Addendum)
 She got one dose of IM b12 1000mcg today, declines additional injections.  B12 level has improved with sublingual B12 2500mcg daily.  B12 in 4000s. Recommend patient to decrease b12 supplementation to once per week.  Positive intrinsic factor antibody.

## 2023-09-18 ENCOUNTER — Other Ambulatory Visit: Payer: Self-pay

## 2023-09-18 DIAGNOSIS — D510 Vitamin B12 deficiency anemia due to intrinsic factor deficiency: Secondary | ICD-10-CM

## 2023-10-14 ENCOUNTER — Encounter: Payer: Self-pay | Admitting: Nurse Practitioner

## 2023-10-14 ENCOUNTER — Ambulatory Visit (INDEPENDENT_AMBULATORY_CARE_PROVIDER_SITE_OTHER): Payer: Medicare Other | Admitting: Nurse Practitioner

## 2023-10-14 VITALS — BP 128/70 | HR 99 | Temp 97.2°F | Resp 16 | Ht 63.0 in | Wt 194.6 lb

## 2023-10-14 DIAGNOSIS — E782 Mixed hyperlipidemia: Secondary | ICD-10-CM | POA: Diagnosis not present

## 2023-10-14 DIAGNOSIS — E559 Vitamin D deficiency, unspecified: Secondary | ICD-10-CM

## 2023-10-14 DIAGNOSIS — E876 Hypokalemia: Secondary | ICD-10-CM | POA: Diagnosis not present

## 2023-10-14 DIAGNOSIS — R945 Abnormal results of liver function studies: Secondary | ICD-10-CM

## 2023-10-14 DIAGNOSIS — I10 Essential (primary) hypertension: Secondary | ICD-10-CM

## 2023-10-14 MED ORDER — FUROSEMIDE 20 MG PO TABS: 20.0000 mg | ORAL_TABLET | Freq: Every day | ORAL | 1 refills | Status: DC

## 2023-10-14 MED ORDER — POTASSIUM CHLORIDE CRYS ER 20 MEQ PO TBCR: EXTENDED_RELEASE_TABLET | ORAL | 1 refills | Status: DC

## 2023-10-14 MED ORDER — LOVASTATIN 40 MG PO TABS: 40.0000 mg | ORAL_TABLET | Freq: Every day | ORAL | 3 refills | Status: AC

## 2023-10-14 MED ORDER — LOSARTAN POTASSIUM-HCTZ 100-25 MG PO TABS: ORAL_TABLET | ORAL | 1 refills | Status: DC

## 2023-10-14 NOTE — Progress Notes (Signed)
 [facility] [address] [place], [place] [address]  Internal MEDICINE  Office Visit Note  Patient Name: Tiffany Schaefer  [id number]  [id number]  Date of Service: 10/14/2023  Chief Complaint  Patient presents with   Hypertension   Hyperlipidemia   Follow-up    HPI Tiffany Schaefer presents for a follow-up visit for hypertension, high cholesterol, refills and lab orders.  Low B12 -- has been seeing Dr. Wilhelmenia Harada and getting this treated through oncology. This has been improving, her next visit in in November.  Abnormal liver function -- need to repeat labs in November  Hypertension -- controlled with carvedilol , losartan -hydrochlorothiazide and potassium.  High cholesterol -- takes lovastatin  almost every night but does forgets to take this medication sometimes since it is the only one she takes at night  Has AWV in November this year, will need repeat labs before that visit.    Current Medication: Outpatient Encounter Medications as of 10/14/2023  Medication Sig   albuterol  (VENTOLIN  HFA) 108 (90 Base) MCG/ACT inhaler Inhale 2 puffs into the lungs every 6 (six) hours as needed for wheezing or shortness of breath.   aspirin EC 81 MG tablet Take 81 mg by mouth daily with lunch.   bisacodyl (DULCOLAX) 5 MG EC tablet Take 5 mg by mouth daily as needed for moderate constipation.   carvedilol  (COREG ) 25 MG tablet TAKE 1 TABLET(25 MG) BY MOUTH DAILY WITH LUNCH   Cyanocobalamin  (B-12) 2500 MCG SUBL Place 1 tablet under the tongue daily.   triamcinolone  cream (KENALOG ) 0.5 % Apply topically 2 (two) times daily.   [DISCONTINUED] furosemide  (LASIX ) 20 MG tablet TAKE 1 TABLET(20 MG) BY MOUTH DAILY   [DISCONTINUED] losartan -hydrochlorothiazide (HYZAAR) 100-25 MG tablet TAKE 1 TABLET BY MOUTH EVERY DAY WITH LUNCH   [DISCONTINUED] lovastatin  (MEVACOR ) 40 MG tablet Take 1 tablet (40 mg total) by mouth at bedtime.   [DISCONTINUED] potassium chloride  SA (KLOR-CON  M) 20 MEQ tablet TAKE 1 TABLET(20 MEQ) BY  MOUTH daily   furosemide  (LASIX ) 20 MG tablet Take 1 tablet (20 mg total) by mouth daily.   losartan -hydrochlorothiazide (HYZAAR) 100-25 MG tablet TAKE 1 TABLET BY MOUTH EVERY DAY WITH LUNCH   lovastatin  (MEVACOR ) 40 MG tablet Take 1 tablet (40 mg total) by mouth at bedtime.   potassium chloride  SA (KLOR-CON  M) 20 MEQ tablet TAKE 1 TABLET(20 MEQ) BY MOUTH daily   No facility-administered encounter medications on file as of 10/14/2023.    Surgical History: Past Surgical History:  Procedure Laterality Date   CESAREAN SECTION     denies 09/03/17   COLONOSCOPY     HEMORRHOID SURGERY     HYSTEROSCOPY WITH D & C N/A 09/11/2017   Procedure: DILATATION AND CURETTAGE /HYSTEROSCOPY;  Surgeon: Darl Edu, MD;  Location: [facility] ORS;  Service: Gynecology;  Laterality: N/A;    Medical History: Past Medical History:  Diagnosis Date   Arthritis    Hyperlipidemia    Hypertension    Sinus infection    Sleep apnea     Family History: Family History  Problem Relation Age of Onset   Stroke Mother    Cirrhosis Father    Diabetes Brother    Rheumatic fever Brother    Arthritis/Rheumatoid Brother     Social History   Socioeconomic History   Marital status: Married    Spouse name: Not on file   Number of children: Not on file   Years of education: Not on file   Highest education level: Not on file  Occupational History   Not on file  Tobacco Use   Smoking status: Every Day    Current packs/day: 0.50    Average packs/day: 0.7 packs/day for 61.4 years (45.7 ttl pk-yrs)    Types: Cigarettes    Start date: 73   Smokeless tobacco: Never  Vaping Use   Vaping status: Never Used  Substance and Sexual Activity   Alcohol use: Not Currently    Comment: occasionally   Drug use: No   Sexual activity: Not Currently    Birth control/protection: None  Other Topics Concern   Not on file  Social History Narrative   Not on file   Social Drivers of Health   Financial Resource Strain: Low  Risk  (08/01/2020)   Overall Financial Resource Strain (CARDIA)    Difficulty of Paying Living Expenses: Not hard at all  Food Insecurity: No Food Insecurity (04/23/2023)   Hunger Vital Sign    Worried About Running Out of Food in the Last Year: Never true    Ran Out of Food in the Last Year: Never true  Transportation Needs: No Transportation Needs (04/23/2023)   PRAPARE - Administrator, Civil Service (Medical): No    Lack of Transportation (Non-Medical): No  Physical Activity: Not on file  Stress: Not on file  Social Connections: Not on file  Intimate Partner Violence: Not At Risk (04/23/2023)   Humiliation, Afraid, Rape, and Kick questionnaire    Fear of Current or Ex-Partner: No    Emotionally Abused: No    Physically Abused: No    Sexually Abused: No      Review of Systems  Constitutional:  Negative for chills, fatigue and unexpected weight change.  HENT:  Negative for congestion, postnasal drip, rhinorrhea, sneezing and sore throat.   Eyes:  Negative for redness.  Respiratory:  Negative for cough, chest tightness and shortness of breath.   Cardiovascular:  Negative for chest pain and palpitations.  Gastrointestinal:  Negative for abdominal pain, constipation, diarrhea, nausea and vomiting.  Genitourinary:  Negative for dysuria and frequency.  Musculoskeletal:  Negative for arthralgias, back pain, joint swelling and neck pain.  Skin:  Negative for rash.  Neurological: Negative.  Negative for tremors and numbness.  Hematological:  Negative for adenopathy. Does not bruise/bleed easily.  Psychiatric/Behavioral:  Negative for behavioral problems (Depression), sleep disturbance and suicidal ideas. The patient is not nervous/anxious.     Vital Signs: BP 128/70   Pulse 99   Temp (!) 97.2 F (36.2 C)   Resp 16   Ht 5\' 3"  (1.6 m)   Wt 194 lb 9.6 oz (88.3 kg)   SpO2 96%   BMI 34.47 kg/m    Physical Exam Vitals reviewed.  Constitutional:      Appearance:  Normal appearance.  HENT:     Head: Normocephalic and atraumatic.     Nose: Nose normal.     Mouth/Throat:     Mouth: Mucous membranes are moist.     Pharynx: No posterior oropharyngeal erythema.  Eyes:     Extraocular Movements: Extraocular movements intact.     Pupils: Pupils are equal, round, and reactive to light.  Cardiovascular:     Pulses: Normal pulses.     Heart sounds: Normal heart sounds.  Pulmonary:     Effort: Pulmonary effort is normal.     Breath sounds: Normal breath sounds.  Neurological:     General: No focal deficit present.     Mental Status: She is alert.  Psychiatric:        Mood and Affect: Mood normal.        Behavior: Behavior normal.        Assessment/Plan: 1. Primary hypertension (Primary) Stable, continue furosemide , losartan -hydrochlorothiazide and potassium as prescribed.  - furosemide  (LASIX ) 20 MG tablet; Take 1 tablet (20 mg total) by mouth daily.  Dispense: 90 tablet; Refill: 1 - losartan -hydrochlorothiazide (HYZAAR) 100-25 MG tablet; TAKE 1 TABLET BY MOUTH EVERY DAY WITH LUNCH  Dispense: 90 tablet; Refill: 1 - potassium chloride  SA (KLOR-CON  M) 20 MEQ tablet; TAKE 1 TABLET(20 MEQ) BY MOUTH daily  Dispense: 90 tablet; Refill: 1  2. Mixed hyperlipidemia Continue lovastatin  as prescribed. Routine labs ordered  - lovastatin  (MEVACOR ) 40 MG tablet; Take 1 tablet (40 mg total) by mouth at bedtime.  Dispense: 90 tablet; Refill: 3 - CMP14+EGFR - Vitamin D  (25 hydroxy) - Lipid Profile  3. Abnormal liver function Routine labs ordered  - CMP14+EGFR - Vitamin D  (25 hydroxy) - Lipid Profile  4. Hypokalemia Continue potassium supplement as prescribed.  - potassium chloride  SA (KLOR-CON  M) 20 MEQ tablet; TAKE 1 TABLET(20 MEQ) BY MOUTH daily  Dispense: 90 tablet; Refill: 1  5. Vitamin D  deficiency Routine labs ordered  - CMP14+EGFR - Vitamin D  (25 hydroxy) - Lipid Profile   General Counseling: Tiffany Schaefer verbalizes understanding of the findings  of todays visit and agrees with plan of treatment. I have discussed any further diagnostic evaluation that may be needed or ordered today. We also reviewed her medications today. she has been encouraged to call the office with any questions or concerns that should arise related to todays visit.    Orders Placed This Encounter  Procedures   CMP14+EGFR   Vitamin D  (25 hydroxy)   Lipid Profile    Meds ordered this encounter  Medications   furosemide  (LASIX ) 20 MG tablet    Sig: Take 1 tablet (20 mg total) by mouth daily.    Dispense:  90 tablet    Refill:  1    For future refills   losartan -hydrochlorothiazide (HYZAAR) 100-25 MG tablet    Sig: TAKE 1 TABLET BY MOUTH EVERY DAY WITH LUNCH    Dispense:  90 tablet    Refill:  1    For future refills   lovastatin  (MEVACOR ) 40 MG tablet    Sig: Take 1 tablet (40 mg total) by mouth at bedtime.    Dispense:  90 tablet    Refill:  3    For future refills   potassium chloride  SA (KLOR-CON  M) 20 MEQ tablet    Sig: TAKE 1 TABLET(20 MEQ) BY MOUTH daily    Dispense:  90 tablet    Refill:  1    For future refills    Return for previously scheduled, AWV, Makenly Larabee PCP in november, have labs done before visit. .   Total time spent:30 Minutes Time spent includes review of chart, medications, test results, and follow up plan with the patient.   [place] Controlled Substance Database was reviewed by me.  This patient was seen by Laurence Pons, FNP-C in collaboration with Dr. Verneta Gone as a part of collaborative care agreement.   Pam Vanalstine R. Bobbi Burow, MSN, FNP-C Internal medicine

## 2023-11-28 ENCOUNTER — Other Ambulatory Visit: Payer: Self-pay

## 2023-11-28 DIAGNOSIS — R062 Wheezing: Secondary | ICD-10-CM

## 2023-11-28 MED ORDER — ALBUTEROL SULFATE HFA 108 (90 BASE) MCG/ACT IN AERS: 2.0000 | INHALATION_SPRAY | Freq: Four times a day (QID) | RESPIRATORY_TRACT | 3 refills | Status: AC | PRN

## 2024-01-14 DIAGNOSIS — Z1231 Encounter for screening mammogram for malignant neoplasm of breast: Secondary | ICD-10-CM | POA: Diagnosis not present

## 2024-01-15 ENCOUNTER — Telehealth: Payer: Self-pay | Admitting: Nurse Practitioner

## 2024-01-15 NOTE — Telephone Encounter (Signed)
 Received lt breast diagnostic order from [facility]. Signed by Mardy & faxed back; [phone]

## 2024-01-28 ENCOUNTER — Telehealth: Payer: Self-pay | Admitting: Nurse Practitioner

## 2024-01-28 NOTE — Telephone Encounter (Addendum)
 Received additional view order from [facility]. Gave to Alyssa for signature-Melainie   Order signed. Faxed back to [facility]; [phone]. Scanned-Calle

## 2024-02-25 ENCOUNTER — Telehealth: Payer: Self-pay | Admitting: Oncology

## 2024-02-25 NOTE — Telephone Encounter (Signed)
 Pt was scheduled for labs 11/4 and MD 11/7.  Pt called and canceled both appts. Stated she has other appts at these times and will be seeing her PCP next week. Pt did not want to r/s at this time

## 2024-03-03 LAB — CMP14+EGFR
ALT: 8 IU/L (ref 0–32)
AST: 17 IU/L (ref 0–40)
Albumin: 3.1 g/dL — ABNORMAL LOW (ref 3.8–4.8)
Alkaline Phosphatase: 47 IU/L — ABNORMAL LOW (ref 49–135)
BUN/Creatinine Ratio: 20 (ref 12–28)
BUN: 21 mg/dL (ref 8–27)
Bilirubin Total: 0.4 mg/dL (ref 0.0–1.2)
CO2: 24 mmol/L (ref 20–29)
Calcium: 9.1 mg/dL (ref 8.7–10.3)
Chloride: 102 mmol/L (ref 96–106)
Creatinine, Ser: 1.04 mg/dL — ABNORMAL HIGH (ref 0.57–1.00)
Globulin, Total: 5.4 g/dL — ABNORMAL HIGH (ref 1.5–4.5)
Glucose: 84 mg/dL (ref 70–99)
Potassium: 3.8 mmol/L (ref 3.5–5.2)
Sodium: 138 mmol/L (ref 134–144)
Total Protein: 8.5 g/dL (ref 6.0–8.5)
eGFR: 57 mL/min/1.73 — ABNORMAL LOW (ref >59)

## 2024-03-03 LAB — LIPID PANEL
Chol/HDL Ratio: 4.5 ratio — ABNORMAL HIGH (ref 0.0–4.4)
Cholesterol, Total: 179 mg/dL (ref 100–199)
HDL: 40 mg/dL (ref >39)
LDL Chol Calc (NIH): 118 mg/dL — ABNORMAL HIGH (ref 0–99)
Triglycerides: 115 mg/dL (ref 0–149)
VLDL Cholesterol Cal: 21 mg/dL (ref 5–40)

## 2024-03-03 LAB — VITAMIN D 25 HYDROXY (VIT D DEFICIENCY, FRACTURES): Vit D, 25-Hydroxy: 21.1 ng/mL — ABNORMAL LOW (ref 30.0–100.0)

## 2024-03-09 ENCOUNTER — Other Ambulatory Visit

## 2024-03-09 ENCOUNTER — Ambulatory Visit: Payer: Self-pay | Admitting: Internal Medicine

## 2024-03-12 ENCOUNTER — Ambulatory Visit: Admitting: Oncology

## 2024-03-17 ENCOUNTER — Encounter: Payer: Self-pay | Admitting: Nurse Practitioner

## 2024-03-17 ENCOUNTER — Ambulatory Visit (INDEPENDENT_AMBULATORY_CARE_PROVIDER_SITE_OTHER): Payer: Medicare Other | Admitting: Nurse Practitioner

## 2024-03-17 VITALS — BP 130/80 | HR 91 | Temp 96.2°F | Resp 16 | Ht 63.0 in | Wt 193.2 lb

## 2024-03-17 DIAGNOSIS — N289 Disorder of kidney and ureter, unspecified: Secondary | ICD-10-CM

## 2024-03-17 DIAGNOSIS — Z0001 Encounter for general adult medical examination with abnormal findings: Secondary | ICD-10-CM | POA: Diagnosis not present

## 2024-03-17 DIAGNOSIS — E559 Vitamin D deficiency, unspecified: Secondary | ICD-10-CM

## 2024-03-17 DIAGNOSIS — N6320 Unspecified lump in the left breast, unspecified quadrant: Secondary | ICD-10-CM

## 2024-03-17 DIAGNOSIS — R92342 Mammographic extreme density, left breast: Secondary | ICD-10-CM

## 2024-03-17 DIAGNOSIS — E782 Mixed hyperlipidemia: Secondary | ICD-10-CM | POA: Diagnosis not present

## 2024-03-17 DIAGNOSIS — D510 Vitamin B12 deficiency anemia due to intrinsic factor deficiency: Secondary | ICD-10-CM

## 2024-03-17 DIAGNOSIS — F17210 Nicotine dependence, cigarettes, uncomplicated: Secondary | ICD-10-CM

## 2024-03-17 NOTE — Progress Notes (Signed)
 [facility] [address] [place], [place] [address]  Internal MEDICINE  Office Visit Note  Patient Name: Tiffany Schaefer  [id number]  [id number]  Date of Service: 03/17/2024  Chief Complaint  Patient presents with   Hyperlipidemia   Hypertension   Medicare Wellness    HPI Tiffany Schaefer presents for a medicare annual wellness visit.  Well-appearing 73 y.o. female with hypertension, atherosclerosis, high cholesterol, and pernicious anemia. Routine CRC screening: done in 2023 Routine mammogram: abnormal mammogram recently needs to have a breast biopsy done  DEXA scan: done in 2012 Labs: routine lab results discussed. Repeat labs will be ordered.  Slightly decreased kidney function on labs with no prior abnormal kidney function.  New or worsening pain: none Other concerns: none  Abnormal mammogram and abnormal diagnostic mammogram. Breast biopsy is recommended but patient does not want to drive all the way to [facility] medical [facility] in [place]. She would prefer to do her biopsy with [place] [facility].  Current smoker, smokes less than 1 ppd but has been smoking more than 40 years. Due for lung cancer screening.  Pernicious anemia -- does not want to go back to cancer center unless necessary. Needs repeat CBC and B12 level      03/17/2024    9:46 AM 03/17/2023   10:01 AM 03/11/2022   10:09 AM  MMSE - Mini Mental State Exam  Orientation to time 5 5 5   Orientation to Place 5 5 5   Registration 3 3 3   Attention/ Calculation 5 5 5   Recall 3 3 3   Language- name 2 objects 2 2 2   Language- repeat 1 1 1   Language- follow 3 step command 3 3 3   Language- read & follow direction 1 1 1   Write a sentence 1 1 1   Copy design 1 1 1   Total score 30 30 30     Functional Status Survey: Is the patient deaf or have difficulty hearing?: Yes Does the patient have difficulty seeing, even when wearing glasses/contacts?: No Does the patient have difficulty concentrating, remembering,  or making decisions?: No Does the patient have difficulty walking or climbing stairs?: Yes Does the patient have difficulty dressing or bathing?: No Does the patient have difficulty doing errands alone such as visiting a doctor's office or shopping?: No     03/11/2022   10:08 AM 09/09/2022    8:57 AM 03/17/2023   10:00 AM 06/10/2023    9:43 AM 03/17/2024    9:45 AM  Fall Risk  Falls in the past year? 0 0 0 0 0  Was there an injury with Fall? 0 0   0  Fall Risk Category Calculator 0 0   0  Fall Risk Category (Retired) Low       (RETIRED) Patient Fall Risk Level Low fall risk       Patient at Risk for Falls Due to No Fall Risks No Fall Risks  No Fall Risks   Fall risk Follow up Falls evaluation completed  Falls evaluation completed  Falls evaluation completed Falls evaluation completed     Data saved with a previous flowsheet row definition       03/17/2024    9:45 AM  Depression screen PHQ 2/9  Decreased Interest 0  Down, Depressed, Hopeless 0  PHQ - 2 Score 0       Current Medication: Outpatient Encounter Medications as of 03/17/2024  Medication Sig   albuterol  (VENTOLIN  HFA) 108 (90 Base) MCG/ACT inhaler Inhale 2  puffs into the lungs every 6 (six) hours as needed for wheezing or shortness of breath.   aspirin EC 81 MG tablet Take 81 mg by mouth daily with lunch.   carvedilol  (COREG ) 25 MG tablet TAKE 1 TABLET(25 MG) BY MOUTH DAILY WITH LUNCH   Cyanocobalamin  (B-12) 2500 MCG SUBL Place 1 tablet under the tongue daily.   furosemide  (LASIX ) 20 MG tablet Take 1 tablet (20 mg total) by mouth daily.   losartan -hydrochlorothiazide (HYZAAR) 100-25 MG tablet TAKE 1 TABLET BY MOUTH EVERY DAY WITH LUNCH   lovastatin  (MEVACOR ) 40 MG tablet Take 1 tablet (40 mg total) by mouth at bedtime.   potassium chloride  SA (KLOR-CON  M) 20 MEQ tablet TAKE 1 TABLET(20 MEQ) BY MOUTH daily   triamcinolone  cream (KENALOG ) 0.5 % Apply topically 2 (two) times daily.   [DISCONTINUED] bisacodyl (DULCOLAX)  5 MG EC tablet Take 5 mg by mouth daily as needed for moderate constipation. (Patient not taking: Reported on 03/17/2024)   No facility-administered encounter medications on file as of 03/17/2024.    Surgical History: Past Surgical History:  Procedure Laterality Date   CESAREAN SECTION     denies 09/03/17   COLONOSCOPY     HEMORRHOID SURGERY     HYSTEROSCOPY WITH D & C N/A 09/11/2017   Procedure: DILATATION AND CURETTAGE /HYSTEROSCOPY;  Surgeon: Lake Read, MD;  Location: [facility] ORS;  Service: Gynecology;  Laterality: N/A;    Medical History: Past Medical History:  Diagnosis Date   Arthritis    Hyperlipidemia    Hypertension    Sinus infection    Sleep apnea     Family History: Family History  Problem Relation Age of Onset   Stroke Mother    Cirrhosis Father    Diabetes Brother    Rheumatic fever Brother    Arthritis/Rheumatoid Brother     Social History   Socioeconomic History   Marital status: Married    Spouse name: Not on file   Number of children: Not on file   Years of education: Not on file   Highest education level: Not on file  Occupational History   Not on file  Tobacco Use   Smoking status: Every Day    Current packs/day: 0.50    Average packs/day: 0.7 packs/day for 61.9 years (45.9 ttl pk-yrs)    Types: Cigarettes    Start date: 25   Smokeless tobacco: Never  Vaping Use   Vaping status: Never Used  Substance and Sexual Activity   Alcohol use: Not Currently    Comment: occasionally   Drug use: No   Sexual activity: Not Currently    Birth control/protection: None  Other Topics Concern   Not on file  Social History Narrative   Not on file   Social Drivers of Health   Financial Resource Strain: Low Risk  (08/01/2020)   Overall Financial Resource Strain (CARDIA)    Difficulty of Paying Living Expenses: Not hard at all  Food Insecurity: No Food Insecurity (04/23/2023)   Hunger Vital Sign    Worried About Running Out of Food in the Last  Year: Never true    Ran Out of Food in the Last Year: Never true  Transportation Needs: No Transportation Needs (04/23/2023)   PRAPARE - Administrator, Civil Service (Medical): No    Lack of Transportation (Non-Medical): No  Physical Activity: Not on file  Stress: Not on file  Social Connections: Not on file  Intimate Partner Violence: Not At Risk (04/23/2023)  Humiliation, Afraid, Rape, and Kick questionnaire    Fear of Current or Ex-Partner: No    Emotionally Abused: No    Physically Abused: No    Sexually Abused: No      Review of Systems  Constitutional:  Negative for activity change, appetite change, chills, fatigue, fever and unexpected weight change.  HENT:  Negative for congestion, ear pain, rhinorrhea, sneezing, sore throat and trouble swallowing.   Eyes: Negative.  Negative for redness.  Respiratory: Negative.  Negative for cough, chest tightness, shortness of breath and wheezing.   Cardiovascular: Negative.  Negative for chest pain and palpitations.  Gastrointestinal: Negative.  Negative for abdominal pain, blood in stool, constipation, diarrhea, nausea and vomiting.  Endocrine: Negative.   Genitourinary: Negative.  Negative for difficulty urinating, dysuria, frequency, hematuria and urgency.  Musculoskeletal: Negative.  Negative for arthralgias, back pain, joint swelling, myalgias and neck pain.  Skin: Negative.  Negative for rash and wound.  Allergic/Immunologic: Negative.  Negative for immunocompromised state.  Neurological: Negative.  Negative for dizziness, tremors, seizures, numbness and headaches.  Hematological: Negative.  Negative for adenopathy. Does not bruise/bleed easily.  Psychiatric/Behavioral: Negative.  Negative for behavioral problems (Depression), self-injury, sleep disturbance and suicidal ideas. The patient is not nervous/anxious.     Vital Signs: BP 130/80   Pulse 91   Temp (!) 96.2 F (35.7 C)   Resp 16   Ht 5' 3 (1.6 m)   Wt  193 lb 3.2 oz (87.6 kg)   SpO2 96%   BMI 34.22 kg/m    Physical Exam Vitals reviewed.  Constitutional:      General: She is awake. She is not in acute distress.    Appearance: Normal appearance. She is well-developed and well-groomed. She is obese. She is not ill-appearing or diaphoretic.  HENT:     Head: Normocephalic and atraumatic.     Right Ear: Tympanic membrane, ear canal and external ear normal. There is no impacted cerumen.     Left Ear: Tympanic membrane, ear canal and external ear normal. There is no impacted cerumen.     Nose: Nose normal. No congestion or rhinorrhea.     Mouth/Throat:     Lips: Pink.     Mouth: Mucous membranes are moist.     Pharynx: Oropharynx is clear. Uvula midline. No oropharyngeal exudate or posterior oropharyngeal erythema.  Eyes:     General: Lids are normal. Vision grossly intact. Gaze aligned appropriately. No scleral icterus.       Right eye: No discharge.        Left eye: No discharge.     Extraocular Movements: Extraocular movements intact.     Conjunctiva/sclera: Conjunctivae normal.     Pupils: Pupils are equal, round, and reactive to light.     Funduscopic exam:    Right eye: Red reflex present.        Left eye: Red reflex present. Neck:     Thyroid : No thyromegaly.     Vascular: No JVD.     Trachea: No tracheal deviation.  Cardiovascular:     Rate and Rhythm: Normal rate and regular rhythm.     Heart sounds: Normal heart sounds, S1 normal and S2 normal. No murmur heard.    No friction rub. No gallop.  Pulmonary:     Effort: Pulmonary effort is normal. No accessory muscle usage or respiratory distress.     Breath sounds: Normal breath sounds and air entry. No stridor. No wheezing or rales.  Chest:  Chest wall: No tenderness.     Comments: Declined clinical breast exams, gets annual mammograms.  Abdominal:     General: Bowel sounds are normal. There is no distension.     Palpations: Abdomen is soft. There is no shifting  dullness, fluid wave, mass or pulsatile mass.     Tenderness: There is no abdominal tenderness. There is no guarding or rebound.  Musculoskeletal:        General: No tenderness or deformity. Normal range of motion.     Cervical back: Normal range of motion and neck supple.     Right lower leg: No edema.     Left lower leg: No edema.  Lymphadenopathy:     Cervical: No cervical adenopathy.  Skin:    General: Skin is warm and dry.     Capillary Refill: Capillary refill takes less than 2 seconds.     Coloration: Skin is not pale.     Findings: No erythema or rash.  Neurological:     Mental Status: She is alert and oriented to person, place, and time.     Cranial Nerves: No cranial nerve deficit.     Motor: No abnormal muscle tone.     Coordination: Coordination normal.     Gait: Gait normal.     Deep Tendon Reflexes: Reflexes are normal and symmetric.  Psychiatric:        Mood and Affect: Mood and affect normal.        Behavior: Behavior normal. Behavior is cooperative.        Thought Content: Thought content normal.        Judgment: Judgment normal.        Assessment/Plan: 1. Encounter for Medicare annual examination with abnormal findings (Primary) Age-appropriate preventive screenings and vaccinations discussed. Routine labs for health maintenance ordered, see below. PHM updated.   - CMP14+EGFR - CBC with Differential/Platelet - B12 and Folate Panel  2. Pernicious anemia Repeat labs ordered  - CMP14+EGFR - CBC with Differential/Platelet - B12 and Folate Panel  3. Abnormal kidney function Repeat labs ordered  - CMP14+EGFR - CBC with Differential/Platelet - B12 and Folate Panel  4. Mixed hyperlipidemia Continue lovastatin  as prescribed.   5. Vitamin D  deficiency Continue vitamin D  supplement   6. Mass of left breast on mammogram Wants left breast biopsy done locally at [facility] breast center instead of going to [facility] in [place].  - MM CLIP  PLACEMENT LEFT; Future - US  LT BREAST BX W LOC DEV 1ST LESION IMG BX SPEC US  GUIDE; Future  7. BI-RADS category 4B mammogram result of left breast Wants left breast biopsy done locally at [facility] breast center instead of going to [facility] medical center in [place].  - MM CLIP PLACEMENT LEFT; Future - US  LT BREAST BX W LOC DEV 1ST LESION IMG BX SPEC US  GUIDE; Future  8. Smokes with greater than 40 pack year history CT chest for LCS ordered, follow up for results  - CT CHEST LUNG CA SCREEN LOW DOSE W/O CM; Future     General Counseling: Ossie verbalizes understanding of the findings of todays visit and agrees with plan of treatment. I have discussed any further diagnostic evaluation that may be needed or ordered today. We also reviewed her medications today. she has been encouraged to call the office with any questions or concerns that should arise related to todays visit.    Orders Placed This Encounter  Procedures   CT CHEST LUNG CA SCREEN  LOW DOSE W/O CM   MM CLIP PLACEMENT LEFT   US  LT BREAST BX W LOC DEV 1ST LESION IMG BX SPEC US  GUIDE   CMP14+EGFR   CBC with Differential/Platelet   B12 and Folate Panel    No orders of the defined types were placed in this encounter.   Return in about 2 months (around 05/17/2024) for F/U, Labs, Quiana Cobaugh PCP.   Total time spent:30 Minutes Time spent includes review of chart, medications, test results, and follow up plan with the patient.   [place] Controlled Substance Database was reviewed by me.  This patient was seen by Mardy Maxin, FNP-C in collaboration with Dr. Sigrid Bathe as a part of collaborative care agreement.  Khasir Woodrome R. Maxin, MSN, FNP-C Internal medicine

## 2024-03-22 ENCOUNTER — Encounter: Payer: Self-pay | Admitting: Nurse Practitioner

## 2024-03-24 ENCOUNTER — Other Ambulatory Visit: Payer: Self-pay | Admitting: *Deleted

## 2024-03-24 ENCOUNTER — Inpatient Hospital Stay
Admission: RE | Admit: 2024-03-24 | Discharge: 2024-03-24 | Disposition: A | Discharge status: Home / Self Care | Payer: Self-pay | Source: Ambulatory Visit | Attending: Nurse Practitioner | Admitting: Nurse Practitioner

## 2024-03-24 ENCOUNTER — Inpatient Hospital Stay
Admission: RE | Admit: 2024-03-24 | Discharge: 2024-03-24 | Disposition: A | Payer: Self-pay | Source: Ambulatory Visit | Attending: Nurse Practitioner

## 2024-03-24 DIAGNOSIS — Z1231 Encounter for screening mammogram for malignant neoplasm of breast: Secondary | ICD-10-CM

## 2024-03-25 ENCOUNTER — Ambulatory Visit
Admission: RE | Admit: 2024-03-25 | Discharge: 2024-03-25 | Disposition: A | Discharge status: Home / Self Care | Source: Ambulatory Visit | Attending: Nurse Practitioner | Admitting: Nurse Practitioner

## 2024-03-25 ENCOUNTER — Other Ambulatory Visit: Payer: Self-pay | Admitting: Nurse Practitioner

## 2024-03-25 DIAGNOSIS — F17210 Nicotine dependence, cigarettes, uncomplicated: Secondary | ICD-10-CM

## 2024-03-25 DIAGNOSIS — R928 Other abnormal and inconclusive findings on diagnostic imaging of breast: Secondary | ICD-10-CM

## 2024-03-31 ENCOUNTER — Ambulatory Visit
Admission: RE | Admit: 2024-03-31 | Discharge: 2024-03-31 | Disposition: A | Discharge status: Home / Self Care | Source: Ambulatory Visit | Attending: Nurse Practitioner | Admitting: Nurse Practitioner

## 2024-03-31 DIAGNOSIS — N6320 Unspecified lump in the left breast, unspecified quadrant: Secondary | ICD-10-CM

## 2024-03-31 DIAGNOSIS — R92342 Mammographic extreme density, left breast: Secondary | ICD-10-CM | POA: Diagnosis present

## 2024-03-31 DIAGNOSIS — R590 Localized enlarged lymph nodes: Secondary | ICD-10-CM | POA: Diagnosis not present

## 2024-03-31 DIAGNOSIS — R928 Other abnormal and inconclusive findings on diagnostic imaging of breast: Secondary | ICD-10-CM | POA: Insufficient documentation

## 2024-03-31 MED ORDER — LIDOCAINE 1 % OPTIME INJ - NO CHARGE
2.0000 mL | Freq: Once | INTRAMUSCULAR | Status: AC
  Administered 2024-03-31: 2 mL via INTRADERMAL
  Filled 2024-03-31: qty 2

## 2024-03-31 MED ORDER — LIDOCAINE-EPINEPHRINE 1 %-1:100000 IJ SOLN
10.0000 mL | Freq: Once | INTRAMUSCULAR | Status: AC
  Administered 2024-03-31: 10 mL via INTRADERMAL
  Filled 2024-03-31: qty 10

## 2024-04-05 ENCOUNTER — Ambulatory Visit: Admitting: Internal Medicine

## 2024-04-05 ENCOUNTER — Encounter: Payer: Self-pay | Admitting: Internal Medicine

## 2024-04-05 VITALS — BP 132/70 | HR 89 | Temp 98.0°F | Resp 16 | Ht 63.0 in | Wt 191.0 lb

## 2024-04-05 DIAGNOSIS — R911 Solitary pulmonary nodule: Secondary | ICD-10-CM | POA: Diagnosis not present

## 2024-04-05 DIAGNOSIS — J4489 Other specified chronic obstructive pulmonary disease: Secondary | ICD-10-CM | POA: Diagnosis not present

## 2024-04-05 DIAGNOSIS — R0602 Shortness of breath: Secondary | ICD-10-CM | POA: Diagnosis not present

## 2024-04-05 NOTE — Progress Notes (Signed)
 [facility] [address] [place], [place] [address]  Pulmonary Sleep Medicine   Office Visit Note  Patient Name: Tiffany Schaefer DOB: Sep 29, 1950 MRN [id number]  Date of Service: 04/05/2024  Complaints/HPI: She is an active smoker and she states she smokes a PPD. Patient had a screening Ct chest which reveals a category 4A suspiciious nodule. It was recommended by radiology for a 3 month repeat. She wants to wait 3 months to repeat a CT chest. She states she also had a breast biopsy done last week and she is waiting on results. I spoke to her about the importance of smoking cessation. She states she wants to but has a difficult time grappling with that. Currently she does have shortness of breath She states she has inhalers but is not regular with the inhalers  Office Spirometry Results:     ROS  General: (-) fever, (-) chills, (-) night sweats, (-) weakness Skin: (-) rashes, (-) itching,. Eyes: (-) visual changes, (-) redness, (-) itching. Nose and Sinuses: (-) nasal stuffiness or itchiness, (-) postnasal drip, (-) nosebleeds, (-) sinus trouble. Mouth and Throat: (-) sore throat, (-) hoarseness. Neck: (-) swollen glands, (-) enlarged thyroid , (-) neck pain. Respiratory: + cough, (-) bloody sputum, + shortness of breath, - wheezing. Cardiovascular: - ankle swelling, (-) chest pain. Lymphatic: (-) lymph node enlargement. Neurologic: (-) numbness, (-) tingling. Psychiatric: (-) anxiety, (-) depression   Current Medication: Outpatient Encounter Medications as of 04/05/2024  Medication Sig   albuterol  (VENTOLIN  HFA) 108 (90 Base) MCG/ACT inhaler Inhale 2 puffs into the lungs every 6 (six) hours as needed for wheezing or shortness of breath.   aspirin EC 81 MG tablet Take 81 mg by mouth daily with lunch.   carvedilol  (COREG ) 25 MG tablet TAKE 1 TABLET(25 MG) BY MOUTH DAILY WITH LUNCH   Cyanocobalamin  (B-12) 2500 MCG SUBL Place 1 tablet under the tongue daily.   furosemide   (LASIX ) 20 MG tablet Take 1 tablet (20 mg total) by mouth daily.   losartan -hydrochlorothiazide (HYZAAR) 100-25 MG tablet TAKE 1 TABLET BY MOUTH EVERY DAY WITH LUNCH   lovastatin  (MEVACOR ) 40 MG tablet Take 1 tablet (40 mg total) by mouth at bedtime.   potassium chloride  SA (KLOR-CON  M) 20 MEQ tablet TAKE 1 TABLET(20 MEQ) BY MOUTH daily   triamcinolone  cream (KENALOG ) 0.5 % Apply topically 2 (two) times daily.   No facility-administered encounter medications on file as of 04/05/2024.    Surgical History: Past Surgical History:  Procedure Laterality Date   CESAREAN SECTION     denies 09/03/17   COLONOSCOPY     HEMORRHOID SURGERY     HYSTEROSCOPY WITH D & C N/A 09/11/2017   Procedure: DILATATION AND CURETTAGE /HYSTEROSCOPY;  Surgeon: Lake Read, MD;  Location: [facility] ORS;  Service: Gynecology;  Laterality: N/A;    Medical History: Past Medical History:  Diagnosis Date   Arthritis    Hyperlipidemia    Hypertension    Sinus infection    Sleep apnea     Family History: Family History  Problem Relation Age of Onset   Stroke Mother    Cirrhosis Father    Diabetes Brother    Rheumatic fever Brother    Arthritis/Rheumatoid Brother     Social History: Social History   Socioeconomic History   Marital status: Married    Spouse name: Not on file   Number of children: Not on file   Years of education: Not on file   Highest education level:  Not on file  Occupational History   Not on file  Tobacco Use   Smoking status: Every Day    Current packs/day: 0.50    Average packs/day: 0.7 packs/day for 61.9 years (46.0 ttl pk-yrs)    Types: Cigarettes    Start date: 55   Smokeless tobacco: Never   Tobacco comments:    1/2 PPD  Vaping Use   Vaping status: Never Used  Substance and Sexual Activity   Alcohol use: Not Currently    Comment: occasionally   Drug use: No   Sexual activity: Not Currently    Birth control/protection: None  Other Topics Concern   Not on file   Social History Narrative   Not on file   Social Drivers of Health   Financial Resource Strain: Low Risk  (08/01/2020)   Overall Financial Resource Strain (CARDIA)    Difficulty of Paying Living Expenses: Not hard at all  Food Insecurity: No Food Insecurity (04/23/2023)   Hunger Vital Sign    Worried About Running Out of Food in the Last Year: Never true    Ran Out of Food in the Last Year: Never true  Transportation Needs: No Transportation Needs (04/23/2023)   PRAPARE - Administrator, Civil Service (Medical): No    Lack of Transportation (Non-Medical): No  Physical Activity: Not on file  Stress: Not on file  Social Connections: Not on file  Intimate Partner Violence: Not At Risk (04/23/2023)   Humiliation, Afraid, Rape, and Kick questionnaire    Fear of Current or Ex-Partner: No    Emotionally Abused: No    Physically Abused: No    Sexually Abused: No    Vital Signs: Blood pressure 132/70, pulse 89, temperature 98 F (36.7 C), resp. rate 16, height 5' 3 (1.6 m), weight 191 lb (86.6 kg), SpO2 99%.  Examination: General Appearance: The patient is well-developed, well-nourished, and in no distress. Skin: Gross inspection of skin unremarkable. Head: normocephalic, no gross deformities. Eyes: no gross deformities noted. ENT: ears appear grossly normal no exudates. Neck: Supple. No thyromegaly. No LAD. Respiratory: few rhonchi noted. Cardiovascular: Normal S1 and S2 without murmur or rub. Extremities: No cyanosis. pulses are equal. Neurologic: Alert and oriented. No involuntary movements.  LABS: Recent Results (from the past 2160 hours)  CMP14+EGFR     Status: Abnormal   Collection Time: 03/02/24 11:21 AM  Result Value Ref Range   Glucose 84 70 - 99 mg/dL   BUN 21 8 - 27 mg/dL   Creatinine, Ser 8.95 (H) 0.57 - 1.00 mg/dL   eGFR 57 (L) >40 fO/fpw/8.26   BUN/Creatinine Ratio 20 12 - 28   Sodium 138 134 - 144 mmol/L   Potassium 3.8 3.5 - 5.2 mmol/L    Chloride 102 96 - 106 mmol/L   CO2 24 20 - 29 mmol/L   Calcium 9.1 8.7 - 10.3 mg/dL   Total Protein 8.5 6.0 - 8.5 g/dL   Albumin 3.1 (L) 3.8 - 4.8 g/dL   Globulin, Total 5.4 (H) 1.5 - 4.5 g/dL   Bilirubin Total 0.4 0.0 - 1.2 mg/dL   Alkaline Phosphatase 47 (L) 49 - 135 IU/L   AST 17 0 - 40 IU/L   ALT 8 0 - 32 IU/L  Vitamin D  (25 hydroxy)     Status: Abnormal   Collection Time: 03/02/24 11:21 AM  Result Value Ref Range   Vit D, 25-Hydroxy 21.1 (L) 30.0 - 100.0 ng/mL    Comment: Vitamin D  deficiency  has been defined by the Institute of Medicine and an Endocrine Society practice guideline as a level of serum 25-OH vitamin D  less than 20 ng/mL (1,2). The Endocrine Society went on to further define vitamin D  insufficiency as a level between 21 and 29 ng/mL (2). 1. IOM (Institute of Medicine). 2010. Dietary reference    intakes for calcium and D. [place]: The    [employer]. 2. Holick MF, Binkley Juncos, Bischoff-Ferrari HA, et al.    Evaluation, treatment, and prevention of vitamin D     deficiency: an Endocrine Society clinical practice    guideline. JCEM. 2011 Jul; 96(7):1911-30.   Lipid Profile     Status: Abnormal   Collection Time: 03/02/24 11:21 AM  Result Value Ref Range   Cholesterol, Total 179 100 - 199 mg/dL   Triglycerides 884 0 - 149 mg/dL   HDL 40 >60 mg/dL   VLDL Cholesterol Cal 21 5 - 40 mg/dL   LDL Chol Calc (NIH) 881 (H) 0 - 99 mg/dL   Chol/HDL Ratio 4.5 (H) 0.0 - 4.4 ratio    Comment:                                   T. Chol/HDL Ratio                                             Men  Women                               1/2 Avg.Risk  3.4    3.3                                   Avg.Risk  5.0    4.4                                2X Avg.Risk  9.6    7.1                                3X Avg.Risk 23.4   11.0     Radiology: MM CLIP PLACEMENT LEFT Result Date: 03/31/2024 CLINICAL DATA:  Status post LEFT axillary node biopsy EXAM: 3D DIAGNOSTIC  LEFT MAMMOGRAM POST ULTRASOUND BIOPSY COMPARISON:  Previous exam(s). ACR Breast Density Category b: There are scattered areas of fibroglandular density. FINDINGS: 3D Mammographic images were obtained following ultrasound guided biopsy of an enlarged LEFT axillary lymph node. The biopsy marking clip is in expected position at the site of biopsy. IMPRESSION: Appropriate positioning of the HydroMARK coil shaped biopsy marking clip at the site of biopsy in the LEFT axilla. Final Assessment: Post Procedure Mammograms for Marker Placement Electronically Signed   By: Norleen Croak M.D.   On: 03/31/2024 13:53   US  AXILLARY NODE CORE BIOPSY LEFT Result Date: 03/31/2024 CLINICAL DATA:  LEFT axillary lymphadenopathy EXAM: US  AXILLARY NODE CORE BIOPSY LEFT COMPARISON:  Previous exam(s). PROCEDURE: I met with the patient and we discussed the procedure of ultrasound-guided biopsy, including benefits and alternatives. We discussed the high likelihood of a successful  procedure. We discussed the risks of the procedure, including infection, bleeding, tissue injury, clip migration, and inadequate sampling. Informed written consent was given. The usual time-out protocol was performed immediately prior to the procedure. Using sterile technique and 1% lidocaine  and 1% lidocaine  with epinephrine  as local anesthetic, under direct ultrasound visualization, a 14 gauge spring-loaded device was used to perform biopsy of the enlarged LEFT axillary lymph node using a anti radial approach. At the conclusion of the procedure [facility] coil tissue marker clip was deployed into the biopsy cavity. Follow up 2 view mammogram was performed and dictated separately. IMPRESSION: Ultrasound guided biopsy of LEFT axillary lymph node. No apparent complications. Electronically Signed   By: Norleen Croak M.D.   On: 03/31/2024 13:52    No results found.  CT CHEST LUNG CA SCREEN LOW DOSE W/O CM Result Date: 04/02/2024 CLINICAL DATA:  73 year old female  current smoker with 23 pack-year smoking history EXAM: CT CHEST WITHOUT CONTRAST LOW-DOSE FOR LUNG CANCER SCREENING TECHNIQUE: Multidetector CT imaging of the chest was performed following the standard protocol without IV contrast. RADIATION DOSE REDUCTION: This exam was performed according to the departmental dose-optimization program which includes automated exposure control, adjustment of the mA and/or kV according to patient size and/or use of iterative reconstruction technique. COMPARISON:  05/20/2017 chest radiograph. FINDINGS: Cardiovascular: Normal heart size. No significant pericardial effusion/thickening. Three-vessel coronary atherosclerosis. Atherosclerotic nonaneurysmal thoracic aorta. Dilated main pulmonary artery (3.8 cm diameter). Mediastinum/Nodes: No significant thyroid  nodules. Unremarkable esophagus. Multiple mildly enlarged bilateral axillary lymph nodes, largest 1.3 cm on the right on series 2/image 10 and 1.5 cm on the left on image 14. No pathologically enlarged mediastinal or discrete hilar nodes on these noncontrast images. Lungs/Pleura: No pneumothorax. No pleural effusion. Mild centrilobular emphysema with diffuse bronchial wall thickening. No acute consolidative airspace disease or lung masses. Small 7.1 mm peripheral right upper lobe nodule on image 92, which appears to be increased from 1.5 mm on 08/23/2021 neck CT. No additional significant pulmonary nodules. No additional significant pulmonary nodules. Upper abdomen: No acute abnormality. Musculoskeletal: No aggressive appearing focal osseous lesions. Mild thoracic spondylosis. IMPRESSION: 1. Lung-RADS 4A, suspicious. Follow up low-dose chest CT without contrast in 3 months (please use the following order, CT CHEST LCS NODULE FOLLOW-UP W/O CM) is recommended. Peripheral right upper lobe 7.1 mm pulmonary nodule, which appears to be increased from 1.5 mm on 08/23/2021 neck CT. 2. Mild bilateral axillary lymphadenopathy, nonspecific,  potentially reactive. 3. Dilated main pulmonary artery, suggesting pulmonary arterial hypertension. 4. Three-vessel coronary atherosclerosis. 5. Aortic Atherosclerosis (ICD10-I70.0) and Emphysema (ICD10-J43.9). These results will be called to the ordering clinician or representative by the Radiologist Assistant, and communication documented in the PACS or [employer]. Electronically Signed   By: Selinda DELENA Blue M.D.   On: 04/02/2024 15:32   MM CLIP PLACEMENT LEFT Result Date: 03/31/2024 CLINICAL DATA:  Status post LEFT axillary node biopsy EXAM: 3D DIAGNOSTIC LEFT MAMMOGRAM POST ULTRASOUND BIOPSY COMPARISON:  Previous exam(s). ACR Breast Density Category b: There are scattered areas of fibroglandular density. FINDINGS: 3D Mammographic images were obtained following ultrasound guided biopsy of an enlarged LEFT axillary lymph node. The biopsy marking clip is in expected position at the site of biopsy. IMPRESSION: Appropriate positioning of the HydroMARK coil shaped biopsy marking clip at the site of biopsy in the LEFT axilla. Final Assessment: Post Procedure Mammograms for Marker Placement Electronically Signed   By: Norleen Croak M.D.   On: 03/31/2024 13:53   US  AXILLARY NODE CORE BIOPSY LEFT  Result Date: 03/31/2024 CLINICAL DATA:  LEFT axillary lymphadenopathy EXAM: US  AXILLARY NODE CORE BIOPSY LEFT COMPARISON:  Previous exam(s). PROCEDURE: I met with the patient and we discussed the procedure of ultrasound-guided biopsy, including benefits and alternatives. We discussed the high likelihood of a successful procedure. We discussed the risks of the procedure, including infection, bleeding, tissue injury, clip migration, and inadequate sampling. Informed written consent was given. The usual time-out protocol was performed immediately prior to the procedure. Using sterile technique and 1% lidocaine  and 1% lidocaine  with epinephrine  as local anesthetic, under direct ultrasound visualization, a 14 gauge  spring-loaded device was used to perform biopsy of the enlarged LEFT axillary lymph node using a anti radial approach. At the conclusion of the procedure [facility] coil tissue marker clip was deployed into the biopsy cavity. Follow up 2 view mammogram was performed and dictated separately. IMPRESSION: Ultrasound guided biopsy of LEFT axillary lymph node. No apparent complications. Electronically Signed   By: Norleen Croak M.D.   On: 03/31/2024 13:52   MM Outside Films Mammo Result Date: 03/24/2024 This examination belongs to an outside facility and is stored here for comparison purposes only.  Contact the originating outside institution for any associated report or interpretation.  MM Outside Films Mammo Result Date: 03/24/2024 This examination belongs to an outside facility and is stored here for comparison purposes only.  Contact the originating outside institution for any associated report or interpretation.  MM Outside Films Mammo Result Date: 03/24/2024 This examination belongs to an outside facility and is stored here for comparison purposes only.  Contact the originating outside institution for any associated report or interpretation.  MM Outside Films Mammo Result Date: 03/24/2024 This examination belongs to an outside facility and is stored here for comparison purposes only.  Contact the originating outside institution for any associated report or interpretation.  MM Outside Films Mammo Result Date: 03/24/2024 This examination belongs to an outside facility and is stored here for comparison purposes only.  Contact the originating outside institution for any associated report or interpretation.  MM Outside Films Mammo Result Date: 03/24/2024 This examination belongs to an outside facility and is stored here for comparison purposes only.  Contact the originating outside institution for any associated report or interpretation.  MM Outside Films Mammo Result Date: 03/24/2024 This  examination belongs to an outside facility and is stored here for comparison purposes only.  Contact the originating outside institution for any associated report or interpretation.   Assessment and Plan: Patient Active Problem List   Diagnosis Date Noted   BI-RADS category 4B mammogram result of left breast 03/17/2024   Vitamin D  deficiency 03/17/2024   Hepatitis A test positive 09/10/2023   B12 deficiency 05/07/2023   Pernicious anemia 04/23/2023   Atherosclerosis of artery of both lower extremities 05/05/2019   Acute right ankle pain 03/14/2019   Atopic dermatitis 08/21/2018   Peripheral edema 10/20/2017   Leiomyoma of uterus, unspecified 06/09/2017   Cigarette nicotine dependence without complication 06/09/2017   Post-menopausal bleeding 04/21/2017   Hypertension 04/21/2017   Mixed hyperlipidemia 04/21/2017   Cardiac arrhythmia 04/21/2017    1. Obstructive chronic bronchitis without exacerbation (HCC) (Primary) Active smoker. Needs to stop smoking. Counseling provided  2. Obesity, morbid (HCC) Discuss diet and exercise with her for weight loss  3. Pulmonary nodule She wants to wait and get a repeat scan will schedule for 3 months - CT CHEST LCS NODULE F/U LOW DOSE WO CONTRAST; Future  4. Shortness of breath PFT to assess  severity of possible COPd - Pulmonary function test; Future   General Counseling: I have discussed the findings of the evaluation and examination with Andree.  I have also discussed any further diagnostic evaluation thatmay be needed or ordered today. Shelby verbalizes understanding of the findings of todays visit. We also reviewed her medications today and discussed drug interactions and side effects including but not limited excessive drowsiness and altered mental states. We also discussed that there is always a risk not just to her but also people around her. she has been encouraged to call the office with any questions or concerns that should arise related to  todays visit.  No orders of the defined types were placed in this encounter.    Time spent: 26  I have personally obtained a history, examined the patient, evaluated laboratory and imaging results, formulated the assessment and plan and placed orders.    Elfreda DELENA Bathe, MD [facility] Pulmonary and Critical Care Sleep medicine

## 2024-04-06 LAB — SURGICAL PATHOLOGY

## 2024-04-12 ENCOUNTER — Other Ambulatory Visit: Payer: Self-pay | Admitting: Nurse Practitioner

## 2024-04-12 DIAGNOSIS — I10 Essential (primary) hypertension: Secondary | ICD-10-CM

## 2024-05-12 LAB — CBC WITH DIFFERENTIAL/PLATELET
Basophils Absolute: 0 x10E3/uL (ref 0.0–0.2)
Basos: 1 %
EOS (ABSOLUTE): 0.1 x10E3/uL (ref 0.0–0.4)
Eos: 2 %
Hematocrit: 38.2 % (ref 34.0–46.6)
Hemoglobin: 11.9 g/dL (ref 11.1–15.9)
Immature Grans (Abs): 0 x10E3/uL (ref 0.0–0.1)
Immature Granulocytes: 0 %
Lymphocytes Absolute: 2.5 x10E3/uL (ref 0.7–3.1)
Lymphs: 43 %
MCH: 28.5 pg (ref 26.6–33.0)
MCHC: 31.2 g/dL — ABNORMAL LOW (ref 31.5–35.7)
MCV: 91 fL (ref 79–97)
Monocytes Absolute: 0.6 x10E3/uL (ref 0.1–0.9)
Monocytes: 10 %
Neutrophils Absolute: 2.6 x10E3/uL (ref 1.4–7.0)
Neutrophils: 44 %
Platelets: 181 x10E3/uL (ref 150–450)
RBC: 4.18 x10E6/uL (ref 3.77–5.28)
RDW: 15.8 % — ABNORMAL HIGH (ref 11.7–15.4)
WBC: 5.8 x10E3/uL (ref 3.4–10.8)

## 2024-05-12 LAB — CMP14+EGFR
ALT: 11 IU/L (ref 0–32)
AST: 22 IU/L (ref 0–40)
Albumin: 3.1 g/dL — ABNORMAL LOW (ref 3.8–4.8)
Alkaline Phosphatase: 47 IU/L — ABNORMAL LOW (ref 49–135)
BUN/Creatinine Ratio: 25 (ref 12–28)
BUN: 24 mg/dL (ref 8–27)
Bilirubin Total: 0.4 mg/dL (ref 0.0–1.2)
CO2: 23 mmol/L (ref 20–29)
Calcium: 9.1 mg/dL (ref 8.7–10.3)
Chloride: 99 mmol/L (ref 96–106)
Creatinine, Ser: 0.97 mg/dL (ref 0.57–1.00)
Globulin, Total: 5.5 g/dL — ABNORMAL HIGH (ref 1.5–4.5)
Glucose: 96 mg/dL (ref 70–99)
Potassium: 3.5 mmol/L (ref 3.5–5.2)
Sodium: 136 mmol/L (ref 134–144)
Total Protein: 8.6 g/dL — ABNORMAL HIGH (ref 6.0–8.5)
eGFR: 62 mL/min/1.73

## 2024-05-12 LAB — B12 AND FOLATE PANEL
Folate: 7.7 ng/mL
Vitamin B-12: 909 pg/mL (ref 232–1245)

## 2024-05-13 ENCOUNTER — Other Ambulatory Visit: Payer: Self-pay | Admitting: Nurse Practitioner

## 2024-05-13 DIAGNOSIS — I10 Essential (primary) hypertension: Secondary | ICD-10-CM

## 2024-05-13 DIAGNOSIS — E876 Hypokalemia: Secondary | ICD-10-CM

## 2024-05-17 ENCOUNTER — Encounter: Payer: Self-pay | Admitting: Nurse Practitioner

## 2024-05-17 ENCOUNTER — Ambulatory Visit: Admitting: Nurse Practitioner

## 2024-05-17 VITALS — BP 136/80 | HR 97 | Temp 95.6°F | Resp 16 | Ht 63.0 in | Wt 190.8 lb

## 2024-05-17 DIAGNOSIS — I10 Essential (primary) hypertension: Secondary | ICD-10-CM

## 2024-05-17 DIAGNOSIS — J4489 Other specified chronic obstructive pulmonary disease: Secondary | ICD-10-CM | POA: Diagnosis not present

## 2024-05-17 DIAGNOSIS — D510 Vitamin B12 deficiency anemia due to intrinsic factor deficiency: Secondary | ICD-10-CM

## 2024-05-17 DIAGNOSIS — E559 Vitamin D deficiency, unspecified: Secondary | ICD-10-CM | POA: Diagnosis not present

## 2024-05-17 DIAGNOSIS — N289 Disorder of kidney and ureter, unspecified: Secondary | ICD-10-CM

## 2024-05-17 DIAGNOSIS — E782 Mixed hyperlipidemia: Secondary | ICD-10-CM | POA: Diagnosis not present

## 2024-05-17 NOTE — Progress Notes (Signed)
 [facility] [address] [place], [place] [address]  Internal MEDICINE  Office Visit Note  Patient Name: Tiffany Schaefer  [id number]  [id number]  Date of Service: 05/17/2024  Chief Complaint  Patient presents with   Hypertension   Hyperlipidemia   Follow-up    HPI Tiffany Schaefer presents for a follow-up visit for labs results, hypertension and high cholesterol.  Low vitamin D  -- taking OTC supplement Low vitamin B12 -- taking oral OTC supplement A/G ratio is normal but levels are abnormal. --low albumin and elevated globulin.  Hypertension -- controlled with losartan -hydrochlorothiazide, furosemide  and carvedilol   High cholesterol -- taking lovastatin  but often forgets to take it per patient report Pernicious anemia -- still being monitored by Dr. Babara, hematology.  Obesity -- down 1 lb from December.     Current Medication: Outpatient Encounter Medications as of 05/17/2024  Medication Sig   albuterol  (VENTOLIN  HFA) 108 (90 Base) MCG/ACT inhaler Inhale 2 puffs into the lungs every 6 (six) hours as needed for wheezing or shortness of breath.   aspirin EC 81 MG tablet Take 81 mg by mouth daily with lunch.   carvedilol  (COREG ) 25 MG tablet TAKE 1 TABLET(25 MG) BY MOUTH DAILY WITH LUNCH   Cyanocobalamin  (B-12) 2500 MCG SUBL Place 1 tablet under the tongue daily.   furosemide  (LASIX ) 20 MG tablet TAKE 1 TABLET(20 MG) BY MOUTH DAILY   losartan -hydrochlorothiazide (HYZAAR) 100-25 MG tablet TAKE 1 TABLET BY MOUTH EVERY DAY WITH LUNCH   lovastatin  (MEVACOR ) 40 MG tablet Take 1 tablet (40 mg total) by mouth at bedtime.   potassium chloride  SA (KLOR-CON  M) 20 MEQ tablet TAKE 1 TABLET(20 MEQ) BY MOUTH DAILY   triamcinolone  cream (KENALOG ) 0.5 % Apply topically 2 (two) times daily.   No facility-administered encounter medications on file as of 05/17/2024.    Surgical History: Past Surgical History:  Procedure Laterality Date   CESAREAN SECTION     denies 09/03/17   COLONOSCOPY      HEMORRHOID SURGERY     HYSTEROSCOPY WITH D & C N/A 09/11/2017   Procedure: DILATATION AND CURETTAGE /HYSTEROSCOPY;  Surgeon: Lake Read, MD;  Location: [facility] ORS;  Service: Gynecology;  Laterality: N/A;    Medical History: Past Medical History:  Diagnosis Date   Arthritis    Hyperlipidemia    Hypertension    Sinus infection    Sleep apnea     Family History: Family History  Problem Relation Age of Onset   Stroke Mother    Cirrhosis Father    Diabetes Brother    Rheumatic fever Brother    Arthritis/Rheumatoid Brother     Social History   Socioeconomic History   Marital status: Married    Spouse name: Not on file   Number of children: Not on file   Years of education: Not on file   Highest education level: Not on file  Occupational History   Not on file  Tobacco Use   Smoking status: Every Day    Current packs/day: 0.50    Average packs/day: 0.7 packs/day for 62.0 years (46.0 ttl pk-yrs)    Types: Cigarettes    Start date: 48   Smokeless tobacco: Never   Tobacco comments:    1/2 PPD  Vaping Use   Vaping status: Never Used  Substance and Sexual Activity   Alcohol use: Not Currently    Comment: occasionally   Drug use: No   Sexual activity: Not Currently    Birth control/protection: None  Other  Topics Concern   Not on file  Social History Narrative   Not on file   Social Drivers of Health   Tobacco Use: High Risk (05/17/2024)   Patient History    Smoking Tobacco Use: Every Day    Smokeless Tobacco Use: Never    Passive Exposure: Not on file  Financial Resource Strain: Not on file  Food Insecurity: No Food Insecurity (04/23/2023)   Hunger Vital Sign    Worried About Running Out of Food in the Last Year: Never true    Ran Out of Food in the Last Year: Never true  Transportation Needs: No Transportation Needs (04/23/2023)   PRAPARE - Administrator, Civil Service (Medical): No    Lack of Transportation (Non-Medical): No  Physical  Activity: Not on file  Stress: Not on file  Social Connections: Not on file  Intimate Partner Violence: Not At Risk (04/23/2023)   Humiliation, Afraid, Rape, and Kick questionnaire    Fear of Current or Ex-Partner: No    Emotionally Abused: No    Physically Abused: No    Sexually Abused: No  Depression (PHQ2-9): Low Risk (03/17/2024)   Depression (PHQ2-9)    PHQ-2 Score: 0  Alcohol Screen: Low Risk (03/17/2023)   Alcohol Screen    Last Alcohol Screening Score (AUDIT): 2  Housing: Unknown (04/23/2023)   Housing Stability Vital Sign    Unable to Pay for Housing in the Last Year: No    Number of Times Moved in the Last Year: Not on file    Homeless in the Last Year: No  Utilities: Not At Risk (04/23/2023)   [facility] Utilities    Threatened with loss of utilities: No  Health Literacy: Not on file      Review of Systems  Constitutional:  Negative for chills, fatigue and unexpected weight change.  HENT:  Negative for congestion, postnasal drip, rhinorrhea, sneezing and sore throat.   Eyes:  Negative for redness.  Respiratory:  Negative for cough, chest tightness and shortness of breath.   Cardiovascular:  Negative for chest pain and palpitations.  Gastrointestinal:  Negative for abdominal pain, constipation, diarrhea, nausea and vomiting.  Genitourinary:  Negative for dysuria and frequency.  Musculoskeletal:  Negative for arthralgias, back pain, joint swelling and neck pain.  Skin:  Negative for rash.  Neurological: Negative.  Negative for tremors and numbness.  Hematological:  Negative for adenopathy. Does not bruise/bleed easily.  Psychiatric/Behavioral:  Negative for behavioral problems (Depression), sleep disturbance and suicidal ideas. The patient is not nervous/anxious.     Vital Signs: BP 136/80   Pulse 97   Temp (!) 95.6 F (35.3 C)   Resp 16   Ht 5' 3 (1.6 m)   Wt 190 lb 12.8 oz (86.5 kg)   SpO2 99%   BMI 33.80 kg/m    Physical Exam Vitals reviewed.   Constitutional:      Appearance: Normal appearance.  HENT:     Head: Normocephalic and atraumatic.     Nose: Nose normal.     Mouth/Throat:     Mouth: Mucous membranes are moist.     Pharynx: No posterior oropharyngeal erythema.  Eyes:     Extraocular Movements: Extraocular movements intact.     Pupils: Pupils are equal, round, and reactive to light.  Cardiovascular:     Pulses: Normal pulses.     Heart sounds: Normal heart sounds.  Pulmonary:     Effort: Pulmonary effort is normal.     Breath  sounds: Normal breath sounds.  Neurological:     General: No focal deficit present.     Mental Status: She is alert.  Psychiatric:        Mood and Affect: Mood normal.        Behavior: Behavior normal.        Assessment/Plan: 1. Primary hypertension (Primary) Stable, continue medications as prescribed.   2. Obstructive chronic bronchitis without exacerbation (HCC) PFT scheduled for early march as well as a repeat CT chest.   3. Pernicious anemia Improved, levels are stable, being followed by Dr. Babara.   4. Mixed hyperlipidemia Reminded patient to take her lovastatin  daily which will help improve her cholesterol levels further.   5. Abnormal kidney function Levels improved to normal range   6. Vitamin D  deficiency Continue OTC supplement as discussed   7. Obesity, morbid (HCC) Continue OTC supplement as discussed.    General Counseling: Tiffany Schaefer verbalizes understanding of the findings of todays visit and agrees with plan of treatment. I have discussed any further diagnostic evaluation that may be needed or ordered today. We also reviewed her medications today. she has been encouraged to call the office with any questions or concerns that should arise related to todays visit.    No orders of the defined types were placed in this encounter.   No orders of the defined types were placed in this encounter.   Return for F/U, Tiffany Schaefer PCP in may or june .   Total time  spent:30 Minutes Time spent includes review of chart, medications, test results, and follow up plan with the patient.   [place] Controlled Substance Database was reviewed by me.  This patient was seen by Mardy Maxin, FNP-C in collaboration with Dr. Sigrid Bathe as a part of collaborative care agreement.   Tiffany Menden R. Maxin, MSN, FNP-C Internal medicine

## 2024-06-07 ENCOUNTER — Telehealth: Payer: Self-pay | Admitting: Internal Medicine

## 2024-06-07 NOTE — Telephone Encounter (Signed)
 Notified patient of CT appointment date, arrival time, location-[place]

## 2024-06-14 ENCOUNTER — Ambulatory Visit: Admission: RE | Admit: 2024-06-14

## 2024-07-07 ENCOUNTER — Encounter: Admitting: Internal Medicine

## 2024-07-21 ENCOUNTER — Encounter: Admitting: Internal Medicine

## 2024-07-26 ENCOUNTER — Ambulatory Visit: Admitting: Internal Medicine

## 2024-08-02 ENCOUNTER — Ambulatory Visit: Admitting: Internal Medicine

## 2024-10-18 ENCOUNTER — Ambulatory Visit: Admitting: Nurse Practitioner

## 2025-03-18 ENCOUNTER — Ambulatory Visit: Admitting: Nurse Practitioner
